# Patient Record
Sex: Female | Born: 1955 | Race: White | Hispanic: No | Smoking: Never smoker
Health system: Southern US, Community
[De-identification: ages and names within clinical notes are randomized; demographics above are authoritative.]

## PROBLEM LIST (undated history)

## (undated) DIAGNOSIS — C801 Malignant (primary) neoplasm, unspecified: Secondary | ICD-10-CM

## (undated) DIAGNOSIS — T7840XA Allergy, unspecified, initial encounter: Secondary | ICD-10-CM

## (undated) DIAGNOSIS — R569 Unspecified convulsions: Secondary | ICD-10-CM

## (undated) DIAGNOSIS — H539 Unspecified visual disturbance: Secondary | ICD-10-CM

## (undated) HISTORY — DX: Unspecified convulsions: R56.9

## (undated) HISTORY — DX: Unspecified visual disturbance: H53.9

## (undated) HISTORY — PX: COLONOSCOPY: SHX174

## (undated) HISTORY — PX: BREAST SURGERY: SHX581

## (undated) HISTORY — DX: Allergy, unspecified, initial encounter: T78.40XA

## (undated) HISTORY — PX: WISDOM TOOTH EXTRACTION: SHX21

## (undated) HISTORY — PX: MASTECTOMY: SHX3

---
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
## 2001-07-31 ENCOUNTER — Other Ambulatory Visit: Admission: RE | Admit: 2001-07-31 | Discharge: 2001-07-31 | Payer: Self-pay | Admitting: *Deleted

## 2001-07-31 ENCOUNTER — Encounter: Admission: RE | Admit: 2001-07-31 | Discharge: 2001-07-31 | Payer: Self-pay | Admitting: *Deleted

## 2001-09-03 ENCOUNTER — Ambulatory Visit (HOSPITAL_COMMUNITY): Admission: RE | Admit: 2001-09-03 | Discharge: 2001-09-03 | Payer: Self-pay | Admitting: Gastroenterology

## 2002-08-14 ENCOUNTER — Other Ambulatory Visit: Admission: RE | Admit: 2002-08-14 | Discharge: 2002-08-14 | Payer: Self-pay | Admitting: *Deleted

## 2003-11-04 ENCOUNTER — Encounter: Admission: RE | Admit: 2003-11-04 | Discharge: 2003-11-04 | Payer: Self-pay | Admitting: Internal Medicine

## 2010-12-24 ENCOUNTER — Encounter: Payer: Self-pay | Admitting: Internal Medicine

## 2017-04-10 ENCOUNTER — Ambulatory Visit (INDEPENDENT_AMBULATORY_CARE_PROVIDER_SITE_OTHER): Payer: TRICARE For Life (TFL) | Admitting: Neurology

## 2017-04-10 ENCOUNTER — Encounter: Payer: Self-pay | Admitting: Neurology

## 2017-04-10 DIAGNOSIS — G40209 Localization-related (focal) (partial) symptomatic epilepsy and epileptic syndromes with complex partial seizures, not intractable, without status epilepticus: Secondary | ICD-10-CM | POA: Diagnosis not present

## 2017-04-10 MED ORDER — GABAPENTIN 100 MG PO CAPS: ORAL_CAPSULE | ORAL | 4 refills | Status: DC

## 2017-04-10 MED ORDER — OXCARBAZEPINE 300 MG PO TABS
ORAL_TABLET | ORAL | 4 refills | Status: DC
Start: 2017-04-10 — End: 2018-05-09

## 2017-04-10 NOTE — Progress Notes (Signed)
[facility]  PATIENT: Sally Nguyen DOB: [birth year]  REFERRING DOCTOR OR PCP:    Dr. Starleen Blue.   PCP is [facility] (was seeing Dr. Karlton Lemon there) SOURCE: patient, notes from Dr. Starleen Blue.  _________________________________   HISTORICAL  CHIEF COMPLAINT:  Chief Complaint  Patient presents with  . Seizures    Sally Nguyen is here to transfer care of sz. disorder from Dr. Starleen Blue to Dr. Felecia Shelling.  Sts. she was dx. with sz. in 2004.  Sts. she is currenlty well controlled on Oxcarbazepine 300mg , 1 and 1/2 tabs in the am, 2 tabs in the pm, and Gabapentin 100mg  qhs. Sts. last sz. was around 2004/fim    HISTORY OF PRESENT ILLNESS:  I had the pleasure seeing you patient, Sally Nguyen, at [facility] for neurologic consultation regarding her seizure disorder.  She is a 61 year old woman who was diagnosed with seizures in 1996.    She was asleep when a grand mal seizure occurred, witnessed by her husband.    She was taken to the ER and had MRI and EEG.    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   The EEG reportedly showed some asymmetry in the frontal lobe but it was non-specific.   She was started on Dilantin but stopped due to insurance issues a couple years later.   She had several more auras over the next couple years. She has two more seizures in 2000 and went back to her Dilantin (though not too compliant).   Over the next few years, she had multiple auras and had her next two seizures were in 2004 (while on vacation).   She started to see Dr. Starleen Blue and she was started on Trileptal.   Since then, she has had no further auras and no further seizures.     She has been compliant with her medications since 2004.  A recent EEG performed at September 2017 showed hypersynchronous activity in the right temporal region that could be  consistent with a seizure disorder.  An EEG in 2016 showed a left temporal focus.    Current dose of oxcarbazepine is 450 mg in the am and 600 mg at night.  Gabapentin 100-200 mg was added several years ago.   The reason may have been for LBP and sleep disturbances.       REVIEW OF SYSTEMS: Constitutional: No fevers, chills, sweats, or change in appetite.   Notes mild insomnia Eyes: No visual changes, double vision, eye pain Ear, nose and throat: No hearing loss, ear pain, nasal congestion, sore throat Cardiovascular: No chest pain, palpitations Respiratory: No shortness of breath at rest or with exertion.   No wheezes GastrointestinaI: No nausea, vomiting, diarrhea, abdominal pain, fecal incontinence Genitourinary: No dysuria, urinary retention or frequency.  No nocturia. Musculoskeletal: She has osteopenia.   No neck pain.   She gets ome LBP and sees chiropractor.   Integumentary: No rash, pruritus, skin lesions Neurological: as above Psychiatric: No depression at this time.  No anxiety Endocrine: No palpitations, diaphoresis, change in appetite, change in weigh or increased thirst Hematologic/Lymphatic: No anemia, purpura, petechiae. Allergic/Immunologic: She gets seasonal allergies  ALLERGIES: Allergies  Allergen Reactions  . Penicillins Rash    HOME MEDICATIONS:  Current Outpatient Prescriptions:  .  Calcium Citrate (CITRACAL PO), Take by mouth., Disp: , Rfl:  .  cholecalciferol (VITAMIN D) 1000  units tablet, Take 2,000 Units by mouth daily., Disp: , Rfl:  .  fexofenadine (ALLEGRA) 180 MG tablet, Take 180 mg by mouth daily., Disp: , Rfl:  .  gabapentin (NEURONTIN) 100 MG capsule, Take 100 mg by mouth at bedtime., Disp: , Rfl:  .  Oxcarbazepine (TRILEPTAL) 300 MG tablet, Take 300 mg by mouth 2 (two) times daily. Take 1 and 1/2 tablet in the morning and 2 tablets in the evening., Disp: , Rfl:  .  Turmeric 500 MG TABS, Take by mouth., Disp: , Rfl:   PAST MEDICAL  HISTORY: Past Medical History:  Diagnosis Date  . Seizures ([place])   . Vision abnormalities     PAST SURGICAL HISTORY: Past Surgical History:  Procedure Laterality Date  . WISDOM TOOTH EXTRACTION      FAMILY HISTORY: Family History  Problem Relation Age of Onset  . Liver cancer Mother   . Atrial fibrillation Father   . Obesity Sister     SOCIAL HISTORY:  Social History   Social History  . Marital status: Married    Spouse name: N/A  . Number of children: N/A  . Years of education: N/A   Occupational History  . Not on file.   Social History Main Topics  . Smoking status: Never Smoker  . Smokeless tobacco: Never Used  . Alcohol use No  . Drug use: No  . Sexual activity: Not on file   Other Topics Concern  . Not on file   Social History Narrative  . No narrative on file     PHYSICAL EXAM  Vitals:   04/10/17 1133  BP: 96/68  Pulse: 76  Resp: 16  Weight: 120 lb 8 oz (54.7 kg)  Height: 5\' 6"  (1.676 m)    Body mass index is 19.45 kg/m.   General: The patient is well-developed and well-nourished and in no acute distress  Eyes:  Funduscopic exam shows normal optic discs and retinal vessels.  Neck: The neck is supple, no carotid bruits are noted.  The neck is nontender.  Cardiovascular: The heart has a regular rate and rhythm with a normal S1 and S2. There were no murmurs, gallops or rubs. Lungs are clear to auscultation.  Skin: Extremities are without significant edema.  Musculoskeletal:  Back is nontender  Neurologic Exam  Mental status: The patient is alert and oriented x 3 at the time of the examination. The patient has apparent normal recent and remote memory, with an apparently normal attention span and concentration ability.   Speech is normal.  Cranial nerves: Extraocular movements are full. Pupils are equal, round, and reactive to light and accomodation.  Visual fields are full.  Facial symmetry is present. There is good facial sensation to  soft touch bilaterally.Facial strength is normal.  Trapezius and sternocleidomastoid strength is normal. No dysarthria is noted.  The tongue is midline, and the patient has symmetric elevation of the soft palate. No obvious hearing deficits are noted.  Motor:  Muscle bulk is normal.   Tone is normal. Strength is  5 / 5 in all 4 extremities.   Sensory: Sensory testing is intact to pinprick, soft touch and vibration sensation in all 4 extremities.  Coordination: Cerebellar testing reveals good finger-nose-finger and heel-to-shin bilaterally.  Gait and station: Station is normal.   Gait is normal. Tandem gait is normal. Romberg is negative.   Reflexes: Deep tendon reflexes are symmetric and normal bilaterally.   Plantar responses are flexor.    DIAGNOSTIC DATA (LABS, IMAGING,  TESTING) - I reviewed patient records, labs, notes, testing and imaging myself where available.      ASSESSMENT AND PLAN  Partial symptomatic epilepsy with complex partial seizures, not intractable, without status epilepticus ([place]) - Plan: 10-Hydroxycarbazepine, CBC with Differential/Platelet, Comprehensive metabolic panel   In summary, Sally Nguyen is a 61 year old woman with complex partial epilepsy. She has had a total of 5 generalized tonic-clonic seizures and multiple aura.    Her last generalized tonic-clonic seizure was in 2004. That was also the last year that she had an aura.   She has been stable on Trileptal 450 mg every morning and 600 mg daily at bedtime and she tolerates that dose well. She is also on low-dose gabapentin which was probably more for sleep or back pain. She will continue on both of these medications and I will renew them.  As she is very stable for many years, we will make her follow-up visit one year. She should call us sooner if she has any new or worsening neurologic symptoms or any seizures.  Thank you for asking me to see Sally Nguyen. Please let me know if I can be of further  assistance with her or other patients in the future.   Richard A. Felecia Shelling, MD, PhD 02/06/5216, 47:15 AM Certified in Neurology, Clinical Neurophysiology, Sleep Medicine, Pain Medicine and Neuroimaging  [facility] [address] [place], [place] [address] [phone]

## 2017-04-11 NOTE — Progress Notes (Signed)
TODAY'S OV NOTE HAS BEEN FAXED TO [facility] FAX# [phone]/fim

## 2017-04-12 LAB — CBC WITH DIFFERENTIAL/PLATELET
Basophils Absolute: 0.1 10*3/uL (ref 0.0–0.2)
Basos: 1 %
EOS (ABSOLUTE): 0.4 10*3/uL (ref 0.0–0.4)
Eos: 5 %
Hematocrit: 41.2 % (ref 34.0–46.6)
Hemoglobin: 13.7 g/dL (ref 11.1–15.9)
Immature Grans (Abs): 0.1 10*3/uL (ref 0.0–0.1)
Immature Granulocytes: 1 %
Lymphocytes Absolute: 1.2 10*3/uL (ref 0.7–3.1)
Lymphs: 15 %
MCH: 28.4 pg (ref 26.6–33.0)
MCHC: 33.3 g/dL (ref 31.5–35.7)
MCV: 85 fL (ref 79–97)
Monocytes Absolute: 0.7 10*3/uL (ref 0.1–0.9)
Monocytes: 9 %
Neutrophils Absolute: 5.4 10*3/uL (ref 1.4–7.0)
Neutrophils: 69 %
Platelets: 479 10*3/uL — ABNORMAL HIGH (ref 150–379)
RBC: 4.83 x10E6/uL (ref 3.77–5.28)
RDW: 13.5 % (ref 12.3–15.4)
WBC: 7.8 10*3/uL (ref 3.4–10.8)

## 2017-04-12 LAB — COMPREHENSIVE METABOLIC PANEL
ALT: 12 IU/L (ref 0–32)
AST: 17 IU/L (ref 0–40)
Albumin/Globulin Ratio: 1.2 (ref 1.2–2.2)
Albumin: 4.4 g/dL (ref 3.5–5.5)
Alkaline Phosphatase: 64 IU/L (ref 39–117)
BUN/Creatinine Ratio: 19 (ref 9–23)
BUN: 18 mg/dL (ref 6–24)
Bilirubin Total: 0.2 mg/dL (ref 0.0–1.2)
CO2: 26 mmol/L (ref 18–29)
Calcium: 10.2 mg/dL (ref 8.7–10.2)
Chloride: 98 mmol/L (ref 96–106)
Creatinine, Ser: 0.93 mg/dL (ref 0.57–1.00)
GFR calc Af Amer: 78 mL/min/{1.73_m2} (ref >59)
GFR calc non Af Amer: 67 mL/min/{1.73_m2} (ref >59)
Globulin, Total: 3.6 g/dL (ref 1.5–4.5)
Glucose: 111 mg/dL — ABNORMAL HIGH (ref 65–99)
Potassium: 4.7 mmol/L (ref 3.5–5.2)
Sodium: 143 mmol/L (ref 134–144)
Total Protein: 8 g/dL (ref 6.0–8.5)

## 2017-04-12 LAB — 10-HYDROXYCARBAZEPINE: Oxcarbazepine SerPl-Mcnc: 10 ug/mL (ref 10–35)

## 2017-04-16 ENCOUNTER — Telehealth: Payer: Self-pay | Admitting: *Deleted

## 2017-04-16 NOTE — Telephone Encounter (Signed)
-----   Message from Britt Bottom, MD sent at 04/13/2017  8:32 AM EDT ----- Please let the patient know that the lab work is fine.

## 2017-04-16 NOTE — Telephone Encounter (Signed)
I have spoken with Sally Nguyen this morning and per RAS, explained labs done in our office are normal/fim

## 2018-04-11 ENCOUNTER — Ambulatory Visit: Payer: TRICARE For Life (TFL) | Admitting: Neurology

## 2018-05-09 ENCOUNTER — Ambulatory Visit (INDEPENDENT_AMBULATORY_CARE_PROVIDER_SITE_OTHER): Admitting: Neurology

## 2018-05-09 ENCOUNTER — Encounter

## 2018-05-09 ENCOUNTER — Encounter: Payer: Self-pay | Admitting: Neurology

## 2018-05-09 ENCOUNTER — Other Ambulatory Visit: Payer: Self-pay

## 2018-05-09 VITALS — BP 114/60 | HR 90 | Resp 16 | Ht 66.0 in | Wt 127.5 lb

## 2018-05-09 DIAGNOSIS — G40209 Localization-related (focal) (partial) symptomatic epilepsy and epileptic syndromes with complex partial seizures, not intractable, without status epilepticus: Secondary | ICD-10-CM | POA: Diagnosis not present

## 2018-05-09 DIAGNOSIS — G4700 Insomnia, unspecified: Secondary | ICD-10-CM | POA: Diagnosis not present

## 2018-05-09 DIAGNOSIS — G4721 Circadian rhythm sleep disorder, delayed sleep phase type: Secondary | ICD-10-CM | POA: Insufficient documentation

## 2018-05-09 MED ORDER — OXCARBAZEPINE 300 MG PO TABS: ORAL_TABLET | ORAL | 4 refills | Status: DC

## 2018-05-09 MED ORDER — GABAPENTIN 100 MG PO CAPS: ORAL_CAPSULE | ORAL | 4 refills | Status: DC

## 2018-05-09 NOTE — Progress Notes (Signed)
[facility]  PATIENT: Sally Nguyen DOB: Apr 04, 1956  REFERRING DOCTOR OR PCP:    Dr. Starleen Blue.   PCP is [facility] (was seeing Dr. Karlton Lemon there) SOURCE: patient, notes from Dr. Starleen Blue.  _________________________________   HISTORICAL  CHIEF COMPLAINT:  Chief Complaint  Patient presents with  . Seizures    Reports continued compliance with Gabapentin and Oxcarbazepine. Last sz. was in 2004./fim    HISTORY OF PRESENT ILLNESS:  Sally Nguyen is a 62 year old woman who was diagnosed with seizures in 1996.      Update 05/09/2018:   She feels she has been stable since her last visit.    She is tolerating her medications well.    Her last seizure was in 2004.  That dose about the time of her last aura.  She had 2 EEGs over the past few years that showed some abnormalities but she had no clinical events associated with them.  Gabapentin was added to the oxcarbazepine by Dr. Starleen Blue after her last EEG.  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  Most nights, she will stay up until 1 or 2 AM and sometimes still has trouble falling asleep.  She wakes up around 10 AM or even later.  She has always been a "night owl".  She does not snore.    From 04/10/2017: She was asleep when a grand mal seizure occurred, witnessed by her husband.    She was taken to the ER and had MRI and EEG.    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   The EEG reportedly showed some asymmetry in the frontal lobe but it was non-specific.   She was started on Dilantin but stopped due to insurance issues a couple years later.   She had several more auras over the next couple years. She has two more seizures in 2000 and went back to her Dilantin (though not too compliant).   Over the next few years, she had multiple auras and had her next two  seizures were in 2004 (while on vacation).   She started to see Dr. Starleen Blue and she was started on Trileptal.   Since then, she has had no further auras and no further seizures.     She has been compliant with her medications since 2004.  A recent EEG performed at September 2017 showed hypersynchronous activity in the right temporal region that could be consistent with a seizure disorder.  An EEG in 2016 showed a left temporal focus.    Current dose of oxcarbazepine is 450 mg in the am and 600 mg at night.  Gabapentin 100-200 mg was added several years ago.   The reason may have been for LBP and sleep disturbances.       REVIEW OF SYSTEMS: Constitutional: No fevers, chills, sweats, or change in appetite.   Notes mild insomnia Eyes: No visual changes, double vision, eye pain Ear, nose and throat: No hearing loss, ear pain, nasal congestion, sore throat Cardiovascular: No chest pain, palpitations Respiratory: No shortness of breath at rest or with exertion.   No wheezes GastrointestinaI: No nausea, vomiting, diarrhea, abdominal pain, fecal incontinence Genitourinary: No dysuria, urinary retention or frequency.  No nocturia. Musculoskeletal: She has osteopenia.   No neck pain.   She gets ome LBP and sees chiropractor.   Integumentary:  No rash, pruritus, skin lesions Neurological: as above Psychiatric: No depression at this time.  No anxiety Endocrine: No palpitations, diaphoresis, change in appetite, change in weigh or increased thirst Hematologic/Lymphatic: No anemia, purpura, petechiae. Allergic/Immunologic: She gets seasonal allergies  ALLERGIES: Allergies  Allergen Reactions  . Penicillins Rash    HOME MEDICATIONS:  Current Outpatient Medications:  .  Calcium Citrate (CITRACAL PO), Take by mouth., Disp: , Rfl:  .  cholecalciferol (VITAMIN D) 1000 units tablet, Take 2,000 Units by mouth daily., Disp: , Rfl:  .  fexofenadine (ALLEGRA) 180 MG tablet, Take 180 mg by mouth daily.,  Disp: , Rfl:  .  gabapentin (NEURONTIN) 100 MG capsule, One or two at bedtime, Disp: 180 capsule, Rfl: 4 .  Oxcarbazepine (TRILEPTAL) 300 MG tablet, Take 1 and 1/2 tablet in the morning and 2 tablets in the evening., Disp: 360 tablet, Rfl: 4 .  Turmeric 500 MG TABS, Take by mouth., Disp: , Rfl:   PAST MEDICAL HISTORY: Past Medical History:  Diagnosis Date  . Seizures ([place])   . Vision abnormalities     PAST SURGICAL HISTORY: Past Surgical History:  Procedure Laterality Date  . WISDOM TOOTH EXTRACTION      FAMILY HISTORY: Family History  Problem Relation Age of Onset  . Liver cancer Mother   . Atrial fibrillation Father   . Obesity Sister     SOCIAL HISTORY:  Social History   Socioeconomic History  . Marital status: Married    Spouse name: Not on file  . Number of children: Not on file  . Years of education: Not on file  . Highest education level: Not on file  Occupational History  . Not on file  Social Needs  . Financial resource strain: Not on file  . Food insecurity:    Worry: Not on file    Inability: Not on file  . Transportation needs:    Medical: Not on file    Non-medical: Not on file  Tobacco Use  . Smoking status: Never Smoker  . Smokeless tobacco: Never Used  Substance and Sexual Activity  . Alcohol use: No  . Drug use: No  . Sexual activity: Not on file  Lifestyle  . Physical activity:    Days per week: Not on file    Minutes per session: Not on file  . Stress: Not on file  Relationships  . Social connections:    Talks on phone: Not on file    Gets together: Not on file    Attends religious service: Not on file    Active member of club or organization: Not on file    Attends meetings of clubs or organizations: Not on file    Relationship status: Not on file  . Intimate partner violence:    Fear of current or ex partner: Not on file    Emotionally abused: Not on file    Physically abused: Not on file    Forced sexual activity: Not on file   Other Topics Concern  . Not on file  Social History Narrative  . Not on file     PHYSICAL EXAM  Vitals:   05/09/18 1555  BP: 114/60  Pulse: 90  Resp: 16  Weight: 127 lb 8 oz (57.8 kg)  Height: 5\' 6"  (1.676 m)    Body mass index is 20.58 kg/m.   General: The patient is well-developed and well-nourished and in no acute distress   Neurologic Exam  Mental status: The patient is alert  and oriented x 3 at the time of the examination. The patient has apparent normal recent and remote memory, with an apparently normal attention span and concentration ability.   Speech is normal.  Cranial nerves: Extraocular muscles are normal.  Facial strength and sensation is normal.  Trapezius strength is normal..   The tongue is midline, and the patient has symmetric elevation of the soft palate. No obvious hearing deficits are noted.  Motor:  Muscle bulk is normal.   Tone is normal. Strength is  5 / 5 in all 4 extremities.   Sensory: Sensory testing is intact to pinprick, soft touch and vibration sensation in all 4 extremities.  Coordination: Cerebellar testing reveals good finger-nose-finger and heel-to-shin bilaterally.  Gait and station: Station is normal.  Gait and tandem gait are normal.  Reflexes: Deep tendon reflexes are symmetric and normal bilaterally.       DIAGNOSTIC DATA (LABS, IMAGING, TESTING) - I reviewed patient records, labs, notes, testing and imaging myself where available.      ASSESSMENT AND PLAN  Partial symptomatic epilepsy with complex partial seizures, not intractable, without status epilepticus ([facility])  Insomnia, unspecified type  Delayed sleep phase syndrome  1.    She will continue oxcarbazepine at the current dose.  I will send in a 1 year refill.  She could consider stopping the gabapentin if she wants because it is unlikely that the low dose contributes much to seizure control. 2.   We had a long discussion about her delayed phase sleep disorder and  its contribution to her insomnia.  She would prefer to shift her schedule to a 10 PM sleep onset.  I discussed with her that if she wished to shift her schedule it will be easier for her to delay her sleep by 2 hours a day until she is going to bed at the desired 10 PM time.  She can reinforce this by taking melatonin 60 minutes before bedtime and using bright lights shortly after she wakes up, at least 8 hours after sleep onset. 3.  she will return to see me in 1 year or sooner if there are new or worsening neurologic symptoms.Nanine Means. Felecia Shelling, MD, PhD 03/11/5461, 7:03 PM Certified in Neurology, Clinical Neurophysiology, Sleep Medicine, Pain Medicine and Neuroimaging  [facility] [address], [place], [place] [address] [phone]

## 2019-02-17 ENCOUNTER — Other Ambulatory Visit: Payer: Self-pay

## 2019-02-17 ENCOUNTER — Ambulatory Visit (INDEPENDENT_AMBULATORY_CARE_PROVIDER_SITE_OTHER): Admitting: Nurse Practitioner

## 2019-02-17 ENCOUNTER — Other Ambulatory Visit (HOSPITAL_COMMUNITY)
Admission: RE | Admit: 2019-02-17 | Discharge: 2019-02-17 | Disposition: A | Discharge status: Home / Self Care | Source: Ambulatory Visit | Attending: Nurse Practitioner | Admitting: Nurse Practitioner

## 2019-02-17 ENCOUNTER — Encounter: Payer: Self-pay | Admitting: Nurse Practitioner

## 2019-02-17 VITALS — BP 114/74 | HR 73 | Temp 97.7°F | Ht 66.0 in | Wt 130.0 lb

## 2019-02-17 DIAGNOSIS — Z0000 Encounter for general adult medical examination without abnormal findings: Secondary | ICD-10-CM | POA: Diagnosis not present

## 2019-02-17 DIAGNOSIS — G40209 Localization-related (focal) (partial) symptomatic epilepsy and epileptic syndromes with complex partial seizures, not intractable, without status epilepticus: Secondary | ICD-10-CM

## 2019-02-17 DIAGNOSIS — Z124 Encounter for screening for malignant neoplasm of cervix: Secondary | ICD-10-CM | POA: Insufficient documentation

## 2019-02-17 DIAGNOSIS — R7309 Other abnormal glucose: Secondary | ICD-10-CM

## 2019-02-17 DIAGNOSIS — Z1231 Encounter for screening mammogram for malignant neoplasm of breast: Secondary | ICD-10-CM

## 2019-02-17 LAB — POCT URINALYSIS DIPSTICK
Bilirubin, UA: NEGATIVE
Blood, UA: NEGATIVE
Glucose, UA: NEGATIVE
Ketones, UA: NEGATIVE
Leukocytes, UA: NEGATIVE
Nitrite, UA: NEGATIVE
Protein, UA: NEGATIVE
Spec Grav, UA: 1.025 (ref 1.010–1.025)
Urobilinogen, UA: 0.2 E.U./dL
pH, UA: 6.5 (ref 5.0–8.0)

## 2019-02-17 NOTE — Progress Notes (Signed)
Subjective:     Patient ID: Sally Nguyen , female    DOB: 06-08-1956 , 63 y.o.   MRN: [id number]   Chief Complaint  Patient presents with  . Annual Exam   The patient states she uses post menopausal status for birth control. Last LMP was No LMP recorded.. Negative for Dysmenorrhea and Negative for Menorrhagia Mammogram last done 2019.   Negative for: breast discharge, breast lump(s), breast pain and breast self exam.  Pertinent negatives include abnormal bleeding (hematology), anxiety, decreased libido, depression, difficulty falling sleep, dyspareunia, history of infertility, nocturia, sexual dysfunction, sleep disturbances, urinary incontinence, urinary urgency, vaginal discharge and vaginal itching. Diet regular.The patient states her exercise level is  minimal   The patient's tobacco use is:   Social History   Tobacco Use  Smoking Status Never Smoker  Smokeless Tobacco Never Used   She has been exposed to passive smoke. The patient's alcohol use is:  Social History   Substance and Sexual Activity  Alcohol Use No   Additional information: Last pap 01/2017, next one scheduled for today.   HPI  HPI   Past Medical History:  Diagnosis Date  . Seizures ([place])   . Vision abnormalities      Family History  Problem Relation Age of Onset  . Liver cancer Mother   . Atrial fibrillation Father   . Obesity Sister      Current Outpatient Medications:  .  Calcium Citrate (CITRACAL PO), Take by mouth., Disp: , Rfl:  .  cholecalciferol (VITAMIN D) 1000 units tablet, Take 2,000 Units by mouth daily., Disp: , Rfl:  .  fexofenadine (ALLEGRA) 180 MG tablet, Take 180 mg by mouth daily., Disp: , Rfl:  .  gabapentin (NEURONTIN) 100 MG capsule, One or two at bedtime, Disp: 180 capsule, Rfl: 4 .  Oxcarbazepine (TRILEPTAL) 300 MG tablet, Take 1 and 1/2 tablet in the morning and 2 tablets in the evening., Disp: 360 tablet, Rfl: 4 .  Turmeric 500 MG TABS, Take by mouth., Disp: , Rfl:     Allergies  Allergen Reactions  . Penicillins Rash     Review of Systems  Constitutional: Negative.   HENT: Negative.   Eyes: Negative.   Respiratory: Negative.   Cardiovascular: Negative.   Gastrointestinal: Negative.   Endocrine: Negative.   Genitourinary: Negative.   Musculoskeletal: Negative.   Skin: Negative.   Allergic/Immunologic: Negative.   Neurological: Negative.  Negative for dizziness and headaches.  Hematological: Negative.   Psychiatric/Behavioral: Negative.      Today's Vitals   02/17/19 1109  BP: 114/74  Pulse: 73  Temp: 97.7 F (36.5 C)  TempSrc: Oral  SpO2: 94%  Weight: 130 lb (59 kg)  Height: 5\' 6"  (1.676 m)   Body mass index is 20.98 kg/m.   Objective:  Physical Exam Vitals signs reviewed.  Constitutional:      Appearance: Normal appearance. She is well-developed.  HENT:     Head: Normocephalic and atraumatic.     Right Ear: Hearing, tympanic membrane, ear canal and external ear normal.     Left Ear: Hearing, tympanic membrane, ear canal and external ear normal.     Nose: Nose normal.     Mouth/Throat:     Mouth: Mucous membranes are moist.  Eyes:     General: Lids are normal.        Right eye: No discharge.        Left eye: No discharge.     Extraocular Movements: Extraocular movements  intact.     Conjunctiva/sclera: Conjunctivae normal.     Pupils: Pupils are equal, round, and reactive to light.     Funduscopic exam:    Right eye: No papilledema.        Left eye: No papilledema.  Neck:     Musculoskeletal: Full passive range of motion without pain, normal range of motion and neck supple.     Thyroid: No thyroid mass.     Vascular: No carotid bruit.  Cardiovascular:     Rate and Rhythm: Normal rate and regular rhythm.     Pulses: Normal pulses.     Heart sounds: Normal heart sounds. No murmur.  Pulmonary:     Effort: Pulmonary effort is normal.     Breath sounds: Normal breath sounds.  Chest:     Chest wall: No mass.      Breasts:        Right: Normal. No mass, nipple discharge or tenderness.        Left: Normal. No mass, nipple discharge or tenderness.  Abdominal:     General: Abdomen is flat. Bowel sounds are normal.     Palpations: Abdomen is soft.  Genitourinary:    Pubic Area: No rash.      Labia:        Right: No rash or tenderness.        Left: No rash or tenderness.   Musculoskeletal: Normal range of motion.        General: No swelling.     Right lower leg: No edema.     Left lower leg: No edema.  Lymphadenopathy:     Upper Body:     Right upper body: No supraclavicular adenopathy.     Left upper body: No supraclavicular adenopathy.  Skin:    General: Skin is warm and dry.     Capillary Refill: Capillary refill takes less than 2 seconds.  Neurological:     General: No focal deficit present.     Mental Status: She is alert and oriented to person, place, and time.     Cranial Nerves: No cranial nerve deficit.     Sensory: No sensory deficit.  Psychiatric:        Mood and Affect: Mood normal.        Behavior: Behavior normal.        Thought Content: Thought content normal.        Judgment: Judgment normal.         Assessment And Plan:     1. Screening mammogram, encounter for  Pt instructed on Self Breast Exam.According to ACOG guidelines Women aged 35 and older are recommended to get an annual mammogram. Form completed and given to patient contact the [facility] for appointment scheduing.   Pt encouraged to get annual mammogram - MM DIGITAL SCREENING BILATERAL; Future  2. Routine physical examination . Behavior modifications discussed and diet history reviewed.   . Pt will continue to exercise regularly and modify diet with low GI, plant based foods and decrease intake of processed foods.  . Recommend intake of daily multivitamin, Vitamin D, and calcium.  . Recommend mammogram for preventive screenings, as well as recommend immunizations that include influenza, TDAP -  POCT Urinalysis Dipstick (81002) - CMP14 + Anion Gap - CBC no Diff  3. Abnormal glucose  Chronic, controlled  No current medications - Hemoglobin A1c  4. Encounter for Papanicolaou smear of cervix  No abnormal findings on physical exam - Cytology -Pap  Smear  5. Partial symptomatic epilepsy with complex partial seizures, not intractable, without status epilepticus ([place]) Chronic, controlled Being followed by [facility] denies any seizure activity Continue with current medications - CMP14 + Anion Gap        [name], FNP

## 2019-02-18 LAB — CMP14 + ANION GAP
ALT: 8 IU/L (ref 0–32)
AST: 14 IU/L (ref 0–40)
Albumin/Globulin Ratio: 1.7 (ref 1.2–2.2)
Albumin: 4.5 g/dL (ref 3.8–4.8)
Alkaline Phosphatase: 61 IU/L (ref 39–117)
Anion Gap: 16 mmol/L (ref 10.0–18.0)
BUN/Creatinine Ratio: 19 (ref 12–28)
BUN: 17 mg/dL (ref 8–27)
Bilirubin Total: <0.2 mg/dL (ref 0.0–1.2)
CO2: 26 mmol/L (ref 20–29)
Calcium: 10.2 mg/dL (ref 8.7–10.3)
Chloride: 102 mmol/L (ref 96–106)
Creatinine, Ser: 0.88 mg/dL (ref 0.57–1.00)
GFR calc Af Amer: 82 mL/min/{1.73_m2} (ref >59)
GFR calc non Af Amer: 71 mL/min/{1.73_m2} (ref >59)
Globulin, Total: 2.7 g/dL (ref 1.5–4.5)
Glucose: 77 mg/dL (ref 65–99)
Potassium: 4.3 mmol/L (ref 3.5–5.2)
Sodium: 144 mmol/L (ref 134–144)
Total Protein: 7.2 g/dL (ref 6.0–8.5)

## 2019-02-18 LAB — HEMOGLOBIN A1C
Est. average glucose Bld gHb Est-mCnc: 123 mg/dL
Hgb A1c MFr Bld: 5.9 % — ABNORMAL HIGH (ref 4.8–5.6)

## 2019-02-18 LAB — CBC
Hematocrit: 40 % (ref 34.0–46.6)
Hemoglobin: 13.4 g/dL (ref 11.1–15.9)
MCH: 29.1 pg (ref 26.6–33.0)
MCHC: 33.5 g/dL (ref 31.5–35.7)
MCV: 87 fL (ref 79–97)
Platelets: 280 10*3/uL (ref 150–450)
RBC: 4.61 x10E6/uL (ref 3.77–5.28)
RDW: 13 % (ref 11.7–15.4)
WBC: 5.7 10*3/uL (ref 3.4–10.8)

## 2019-02-21 LAB — CYTOLOGY - PAP: Diagnosis: NEGATIVE

## 2019-04-29 ENCOUNTER — Other Ambulatory Visit: Payer: Self-pay

## 2019-04-29 ENCOUNTER — Ambulatory Visit
Admission: RE | Admit: 2019-04-29 | Discharge: 2019-04-29 | Disposition: A | Discharge status: Home / Self Care | Source: Ambulatory Visit | Attending: Nurse Practitioner | Admitting: Nurse Practitioner

## 2019-04-29 DIAGNOSIS — Z1231 Encounter for screening mammogram for malignant neoplasm of breast: Secondary | ICD-10-CM

## 2019-04-30 ENCOUNTER — Other Ambulatory Visit: Payer: Self-pay | Admitting: Nurse Practitioner

## 2019-04-30 DIAGNOSIS — R921 Mammographic calcification found on diagnostic imaging of breast: Secondary | ICD-10-CM

## 2019-05-05 DIAGNOSIS — C801 Malignant (primary) neoplasm, unspecified: Secondary | ICD-10-CM

## 2019-05-05 HISTORY — DX: Malignant (primary) neoplasm, unspecified: C80.1

## 2019-05-08 ENCOUNTER — Other Ambulatory Visit: Payer: Self-pay

## 2019-05-08 ENCOUNTER — Other Ambulatory Visit: Payer: Self-pay | Admitting: Nurse Practitioner

## 2019-05-08 ENCOUNTER — Ambulatory Visit
Admission: RE | Admit: 2019-05-08 | Discharge: 2019-05-08 | Disposition: A | Discharge status: Home / Self Care | Source: Ambulatory Visit | Attending: Nurse Practitioner | Admitting: Nurse Practitioner

## 2019-05-08 DIAGNOSIS — R921 Mammographic calcification found on diagnostic imaging of breast: Secondary | ICD-10-CM

## 2019-05-12 ENCOUNTER — Telehealth: Payer: Self-pay | Admitting: *Deleted

## 2019-05-12 ENCOUNTER — Other Ambulatory Visit: Payer: Self-pay | Admitting: Neurology

## 2019-05-12 NOTE — Telephone Encounter (Signed)
Called pt. Updated med list, pharmacy, allergies on file for VV 05/14/19 with Dr. Felecia Shelling.  Confirmed she received email for VV.

## 2019-05-14 ENCOUNTER — Ambulatory Visit
Admission: RE | Admit: 2019-05-14 | Discharge: 2019-05-14 | Disposition: A | Discharge status: Home / Self Care | Source: Ambulatory Visit | Attending: Nurse Practitioner | Admitting: Nurse Practitioner

## 2019-05-14 ENCOUNTER — Encounter: Payer: Self-pay | Admitting: Neurology

## 2019-05-14 ENCOUNTER — Other Ambulatory Visit: Payer: Self-pay

## 2019-05-14 ENCOUNTER — Ambulatory Visit (INDEPENDENT_AMBULATORY_CARE_PROVIDER_SITE_OTHER): Admitting: Neurology

## 2019-05-14 DIAGNOSIS — G40209 Localization-related (focal) (partial) symptomatic epilepsy and epileptic syndromes with complex partial seizures, not intractable, without status epilepticus: Secondary | ICD-10-CM | POA: Diagnosis not present

## 2019-05-14 DIAGNOSIS — G4721 Circadian rhythm sleep disorder, delayed sleep phase type: Secondary | ICD-10-CM

## 2019-05-14 DIAGNOSIS — G4700 Insomnia, unspecified: Secondary | ICD-10-CM | POA: Diagnosis not present

## 2019-05-14 DIAGNOSIS — R921 Mammographic calcification found on diagnostic imaging of breast: Secondary | ICD-10-CM

## 2019-05-14 MED ORDER — GABAPENTIN 100 MG PO CAPS: ORAL_CAPSULE | ORAL | 4 refills | Status: DC

## 2019-05-14 MED ORDER — OXCARBAZEPINE 300 MG PO TABS: ORAL_TABLET | ORAL | 4 refills | Status: DC

## 2019-05-14 NOTE — Progress Notes (Signed)
[facility]  PATIENT: Sally Nguyen DOB: 1956/03/09  REFERRING DOCTOR OR PCP:    Dr. Starleen Blue.   PCP is [facility] (was seeing Dr. Karlton Lemon there) SOURCE: patient, notes from Dr. Starleen Blue.  _________________________________   HISTORICAL  CHIEF COMPLAINT:  No chief complaint on file.   HISTORY OF PRESENT ILLNESS:  Sally Nguyen is a 63 year old woman who was diagnosed with seizures in 1996.      Update 05/14/2019: Virtual Visit via Video Note I connected with Sally Nguyen on 05/14/19 at  3:00 PM EDT by a video enabled telemedicine application and verified that I am speaking with the correct person.  I discussed the limitations of evaluation and management by telemedicine and the availability of in person appointments. The patient expressed understanding and agreed to proceed.  History of Present Illness: She denies any more seizures.  Her last seizure was in 2004.  Prior to that, she was having about 1-2 seizures per year.  She tolerates the oxcarbazepine well. Current dose is 450 mg qAM and 600 mg qHS. In 2018, the blood level was low normal.  Her last EEG was in 2017 and showed hypersynchronous activity in the right temporal region that could be consistent with a seizure disorder.  An EEG in 2016 showed a left temporal focus.  She reports that most EEGs have shown some abnormality.  Her second issue is a mild sleep disturbance.  She has delayed phase sleep syndrome usually going to bed around 2 AM hours of sleep.  When she falls asleep she does well.  Adding the gabapentin before bedtime has helped her fall asleep and stay asleep better..   Observations/Objective:  She is a well-developed well-nourished woman in no acute distress.  The head is normocephalic and atraumatic.  Sclera are anicteric.  Visible skin appears normal.  The neck has a good range of motion.  She is alert and fully oriented with fluent speech and good attention, knowledge and  memory.  Extraocular muscles are intact.  Facial strength is normal.  She appears to have normal strength in the arms.  Rapid alternating movements and finger-nose-finger are performed well.  Assessment and Plan: Partial symptomatic epilepsy with complex partial seizures, not intractable, without status epilepticus ([place])  Delayed sleep phase syndrome  Insomnia, unspecified type  1.  She will continue oxcarbazepine for her seizures.   At the next visit we will check an oxcarbazepine level and also set up a follow-up EEG to determine if there has been any changes. 2.   Continue gabapentin to help sleep issues.  As she is not working the next day, the delayed phase is not a problem for her.  She has always been a "night owl. 3.    She will return in 1 year or sooner if there are new or worsening neurologic symptoms.  Follow Up Instructions: I discussed the assessment and treatment plan with the patient. The patient was provided an opportunity to ask questions and all were answered. The patient agreed with the plan and demonstrated an understanding of the instructions.    The patient was advised to call back or seek an in-person evaluation if the symptoms worsen or if the condition fails to improve as anticipated.  I provided 18 minutes of non-face-to-face time during this encounter.  _____________________________________________  Update 05/09/2018:   She feels she has been stable since her last visit.    She is tolerating her medications well.    Her  last seizure was in 2004.  That dose about the time of her last aura.  She had 2 EEGs over the past few years that showed some abnormalities but she had no clinical events associated with them.  Gabapentin was added to the oxcarbazepine by Dr. Starleen Blue after her last EEG.  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  Most nights, she will stay up until 1 or 2 AM and sometimes still has trouble falling asleep.  She wakes up  around 10 AM or even later.  She has always been a "night owl".  She does not snore.    From 04/10/2017: She was asleep when a grand mal seizure occurred, witnessed by her husband.    She was taken to the ER and had MRI and EEG.    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   The EEG reportedly showed some asymmetry in the frontal lobe but it was non-specific.   She was started on Dilantin but stopped due to insurance issues a couple years later.   She had several more auras over the next couple years. She has two more seizures in 2000 and went back to her Dilantin (though not too compliant).   Over the next few years, she had multiple auras and had her next two seizures were in 2004 (while on vacation).   She started to see Dr. Starleen Blue and she was started on Trileptal.   Since then, she has had no further auras and no further seizures.     She has been compliant with her medications since 2004.  A recent EEG performed at September 2017 showed hypersynchronous activity in the right temporal region that could be consistent with a seizure disorder.  An EEG in 2016 showed a left temporal focus.    Current dose of oxcarbazepine is 450 mg in the am and 600 mg at night.  Gabapentin 100-200 mg was added several years ago.   The reason may have been for LBP and sleep disturbances.       REVIEW OF SYSTEMS: Constitutional: No fevers, chills, sweats, or change in appetite.   Notes mild insomnia Eyes: No visual changes, double vision, eye pain Ear, nose and throat: No hearing loss, ear pain, nasal congestion, sore throat Cardiovascular: No chest pain, palpitations Respiratory: No shortness of breath at rest or with exertion.   No wheezes GastrointestinaI: No nausea, vomiting, diarrhea, abdominal pain, fecal incontinence Genitourinary: No dysuria, urinary retention or frequency.  No nocturia.  Musculoskeletal: She has osteopenia.   No neck pain.   She gets ome LBP and sees chiropractor.   Integumentary: No rash, pruritus, skin lesions Neurological: as above Psychiatric: No depression at this time.  No anxiety Endocrine: No palpitations, diaphoresis, change in appetite, change in weigh or increased thirst Hematologic/Lymphatic: No anemia, purpura, petechiae. Allergic/Immunologic: She gets seasonal allergies  ALLERGIES: Allergies  Allergen Reactions  . Penicillins Rash    HOME MEDICATIONS:  Current Outpatient Medications:  .  Calcium Citrate (CITRACAL PO), Take by mouth. +vit d, Disp: , Rfl:  .  cholecalciferol (VITAMIN D) 1000 units tablet, Take 2,000 Units by mouth daily., Disp: , Rfl:  .  fexofenadine (ALLEGRA) 180 MG tablet, Take 180 mg by mouth daily., Disp: , Rfl:  .  gabapentin (NEURONTIN) 100 MG capsule, TAKE 1 OR 2 CAPSULES AT BEDTIME, Disp: 180  capsule, Rfl: 3 .  Oxcarbazepine (TRILEPTAL) 300 MG tablet, Take 1 and 1/2 tablet in the morning and 2 tablets in the evening., Disp: 360 tablet, Rfl: 4 .  Turmeric 500 MG TABS, Take 1 tablet by mouth 2 (two) times a day. , Disp: , Rfl:   PAST MEDICAL HISTORY: Past Medical History:  Diagnosis Date  . Seizures ([place])   . Vision abnormalities     PAST SURGICAL HISTORY: Past Surgical History:  Procedure Laterality Date  . WISDOM TOOTH EXTRACTION      FAMILY HISTORY: Family History  Problem Relation Age of Onset  . Liver cancer Mother   . Atrial fibrillation Father   . Obesity Sister     SOCIAL HISTORY:  Social History   Socioeconomic History  . Marital status: Married    Spouse name: Not on file  . Number of children: Not on file  . Years of education: Not on file  . Highest education level: Not on file  Occupational History  . Not on file  Social Needs  . Financial resource strain: Not on file  . Food insecurity:    Worry: Not on file    Inability: Not on file  . Transportation needs:     Medical: Not on file    Non-medical: Not on file  Tobacco Use  . Smoking status: Never Smoker  . Smokeless tobacco: Never Used  Substance and Sexual Activity  . Alcohol use: No  . Drug use: No  . Sexual activity: Not on file  Lifestyle  . Physical activity:    Days per week: Not on file    Minutes per session: Not on file  . Stress: Not on file  Relationships  . Social connections:    Talks on phone: Not on file    Gets together: Not on file    Attends religious service: Not on file    Active member of club or organization: Not on file    Attends meetings of clubs or organizations: Not on file    Relationship status: Not on file  . Intimate partner violence:    Fear of current or ex partner: Not on file    Emotionally abused: Not on file    Physically abused: Not on file    Forced sexual activity: Not on file  Other Topics Concern  . Not on file  Social History Narrative  . Not on file     PHYSICAL EXAM  There were no vitals filed for this visit.  There is no height or weight on file to calculate BMI.   General: The patient is well-developed and well-nourished and in no acute distress   Neurologic Exam  Mental status: The patient is alert and oriented x 3 at the time of the examination. The patient has apparent normal recent and remote memory, with an apparently normal attention span and concentration ability.   Speech is normal.  Cranial nerves: Extraocular muscles are normal.  Facial strength and sensation is normal.  Trapezius strength is normal..   The tongue is midline, and the patient has symmetric elevation of the soft palate. No obvious hearing deficits are noted.  Motor:  Muscle bulk is normal.   Tone is normal. Strength is  5 / 5 in all 4 extremities.   Sensory: Sensory testing is intact to pinprick, soft touch and vibration sensation in all 4 extremities.  Coordination: Cerebellar testing reveals good finger-nose-finger and heel-to-Nguyen bilaterally.   Gait and station: Station is normal.  Gait and tandem gait are normal.  Reflexes: Deep tendon reflexes are symmetric and normal bilaterally.         Sally A. Felecia Shelling, MD, PhD 4/72/0721, 8:28 PM Certified in Neurology, Clinical Neurophysiology, Sleep Medicine, Pain Medicine and Neuroimaging  [facility] [address], [place], [place] [address] [phone]

## 2019-05-15 ENCOUNTER — Telehealth: Payer: Self-pay | Admitting: Hematology

## 2019-05-15 ENCOUNTER — Encounter: Payer: Self-pay | Admitting: *Deleted

## 2019-05-15 DIAGNOSIS — D0512 Intraductal carcinoma in situ of left breast: Secondary | ICD-10-CM | POA: Insufficient documentation

## 2019-05-15 NOTE — Telephone Encounter (Signed)
Spoke to patient to confirm afternoon [facility] appointment for 6/17, packet emailed and mailed to patient

## 2019-05-19 NOTE — Progress Notes (Signed)
[place]   [phone]) [phone] Fax:([phone]) [place] Note   Patient Care Team: [name], FNP as PCP - General (General Practice) Mauro Kaufmann, RN as Oncology Nurse Navigator Rockwell Germany, RN as Oncology Nurse Navigator Stark Klein, MD as Consulting Physician (General Surgery) Truitt Merle, MD as Consulting Physician (Hematology) Eppie Gibson, MD as Attending Physician (Radiation Oncology)  Date of Service:  05/21/2019   CHIEF COMPLAINTS/PURPOSE OF CONSULTATION:  Newly diagnosed Ductal carcinoma in situ (DCIS) of left breast    Oncology History  Ductal carcinoma in situ (DCIS) of left breast  05/08/2019 Mammogram   Mammogram 05/08/19  IMPRESSION: Suspicious microcalcifications over the outer midportion of the left breast spanning 2.4 x 3.3 x 3.9 cm.   05/14/2019 Initial Biopsy   Diagnosis 05/14/19  Breast, left, needle core biopsy, outer mid - DUCTAL CARCINOMA IN SITU WITH CALCIFICATIONS. - SEE COMMENT. Estrogen Receptor: 0%, NEGATIVE Progesterone Receptor: 0%, NEGATIVE   05/14/2019 Cancer Staging   Staging form: Breast, AJCC 8th Edition - Clinical stage from 05/14/2019: Stage Unknown (cTis (DCIS), cNX, cM0, ER-, PR-, HER2: Not Assessed) - Signed by Truitt Merle, MD on 05/20/2019   05/15/2019 Initial Diagnosis   Ductal carcinoma in situ (DCIS) of left breast      HISTORY OF PRESENTING ILLNESS:  Sally Nguyen 63 y.o. female is a here because of newly diagnosed left breast DCIS. The patient presents to the breast clinic today alone.  This was discovered by Screening mammogram. She did not feel the mass herself. She dose feel a lump from biopsy site. She gets mammograms every year until 2016/01/19. This is her first abnormal mammogram and biopsy. She was previously getting screened in [facility] [place]. She denies any pain, change in breast or weight. She sees her PCP as needed or for yearly exam.   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 She is married with 1 adult son. They have a PMHx of epilepsy since around 01/18/95. She is on Trileptal and Gabapentin. She notes going through fertility work up in the past and tried for 8-9 years. Her mother was diagnosed with colon cancer in 69 and dies in 01-18-2001. She denies any other family history of cancer.     GYN HISTORY  Menarchal: 17 LMP: Fall 2013 Contraceptive: yes, Oral pill for 1 year around 1983-01-18 HRT: No G1P1: first at age 23, breasted for 2 years.     REVIEW OF SYSTEMS:    Constitutional: Denies fevers, chills or abnormal night sweats Eyes: Denies blurriness of vision, double vision or watery eyes Ears, nose, mouth, throat, and face: Denies mucositis or sore throat Respiratory: Denies cough, dyspnea or wheezes Cardiovascular: Denies palpitation, chest discomfort or lower extremity swelling Gastrointestinal:  Denies nausea, heartburn or change in bowel habits Skin: Denies abnormal skin rashes Lymphatics: Denies new lymphadenopathy or easy bruising Neurological:Denies numbness, tingling or new weaknesses Behavioral/Psych: Mood is stable, no new changes  Breast: Left breast ump from biopsy  All other systems were reviewed with the patient and are negative.   MEDICAL HISTORY:  Past Medical History:  Diagnosis Date   Seizures ([place])    Vision abnormalities     SURGICAL HISTORY: Past Surgical History:  Procedure Laterality Date   WISDOM TOOTH EXTRACTION      SOCIAL HISTORY: Social History   Socioeconomic History   Marital status: Married    Spouse name: Not on file   Number of children: 1  Years of education: Not on file   Highest education level: Not on file  Occupational History   Not on file  Social Needs   Financial resource strain: Not on file   Food insecurity    Worry: Not on file    Inability: Not on file   Transportation needs    Medical: Not on file    Non-medical: Not on file  Tobacco Use   Smoking status: Never  Smoker   Smokeless tobacco: Never Used  Substance and Sexual Activity   Alcohol use: No   Drug use: No   Sexual activity: Not on file  Lifestyle   Physical activity    Days per week: Not on file    Minutes per session: Not on file   Stress: Not on file  Relationships   Social connections    Talks on phone: Not on file    Gets together: Not on file    Attends religious service: Not on file    Active member of club or organization: Not on file    Attends meetings of clubs or organizations: Not on file    Relationship status: Not on file   Intimate partner violence    Fear of current or ex partner: Not on file    Emotionally abused: Not on file    Physically abused: Not on file    Forced sexual activity: Not on file  Other Topics Concern   Not on file  Social History Narrative   Not on file    FAMILY HISTORY: Family History  Problem Relation Age of Onset   Liver cancer Mother    Colon cancer Mother        33   Atrial fibrillation Father    Obesity Sister     ALLERGIES:  is allergic to penicillins.  MEDICATIONS:  Current Outpatient Medications  Medication Sig Dispense Refill   Calcium Citrate (CITRACAL PO) Take by mouth. +vit d     cetirizine (ZYRTEC) 10 MG tablet Take 10 mg by mouth daily.     cholecalciferol (VITAMIN D) 1000 units tablet Take 2,000 Units by mouth daily.     gabapentin (NEURONTIN) 100 MG capsule TAKE 1 OR 2 CAPSULES AT BEDTIME 180 capsule 4   Oxcarbazepine (TRILEPTAL) 300 MG tablet Take 1 and 1/2 tablet in the morning and 2 tablets in the evening. 360 tablet 4   UNABLE TO FIND 500 mg. Med Name: Tumeric Curcumin     UNABLE TO FIND Med Name: LivCo     No current facility-administered medications for this visit.     PHYSICAL EXAMINATION: ECOG PERFORMANCE STATUS: 0 - Asymptomatic  Vitals:   05/21/19 1238  BP: 101/62  Pulse: 83  Resp: 17  Temp: 98.5 F (36.9 C)  SpO2: 99%   Filed Weights   05/21/19 1238  Weight: 127  lb 8 oz (57.8 kg)    GENERAL:alert, no distress and comfortable SKIN: skin color, texture, turgor are normal, no rashes or significant lesions EYES: normal, Conjunctiva are pink and non-injected, sclera clear  NECK: supple, thyroid normal size, non-tender, without nodularity LYMPH:  no palpable lymphadenopathy in the cervical, axillary  LUNGS: clear to auscultation and percussion with normal breathing effort HEART: regular rate & rhythm and no murmurs and no lower extremity edema ABDOMEN:abdomen soft, non-tender and normal bowel sounds Musculoskeletal:no cyanosis of digits and no clubbing  NEURO: alert & oriented x 3 with fluent speech, no focal motor/sensory deficits BREAST: (+) mild skin ecchymosis with 2-2.5cm  lump at 4:00 positron likely from biopsy (+) No other palpable mass or adenopathy of either breasts   LABORATORY DATA:  I have reviewed the data as listed CBC Latest Ref Rng & Units 05/21/2019 02/17/2019 04/10/2017  WBC 4.0 - 10.5 K/uL 6.0 5.7 7.8  Hemoglobin 12.0 - 15.0 g/dL 12.4 13.4 13.7  Hematocrit 36.0 - 46.0 % 39.1 40.0 41.2  Platelets 150 - 400 K/uL 258 280 479(H)    CMP Latest Ref Rng & Units 05/21/2019 02/17/2019 04/10/2017  Glucose 70 - 99 mg/dL 88 77 111(H)  BUN 8 - 23 mg/dL _0 Creatinine 0.44 - 1.00 mg/dL 0.88 0.88 0.93  Sodium 135 - 145 mmol/L 141 144 143  Potassium 3.5 - 5.1 mmol/L 3.9 4.3 4.7  Chloride 98 - 111 mmol/L 106 102 98  CO2 22 - 32 mmol/L _1 Calcium 8.9 - 10.3 mg/dL 9.1 10.2 10.2  Total Protein 6.5 - 8.1 g/dL 7.4 7.2 8.0  Total Bilirubin 0.3 - 1.2 mg/dL 0.2(L) <0.2 0.2  Alkaline Phos 38 - 126 U/L 57 61 64  AST 15 - 41 U/L 12(L) 14 17  ALT 0 - 44 U/L _2 RADIOGRAPHIC STUDIES: I have personally reviewed the radiological images as listed and agreed with the findings in the report. Mm Digital Diagnostic Unilat L  Result Date: 05/08/2019 CLINICAL DATA:  Screening recall for left breast microcalcifications. EXAM: DIGITAL DIAGNOSTIC  left MAMMOGRAM COMPARISON:  Previous exam(s). ACR Breast Density Category c: The breast tissue is heterogeneously dense, which may obscure small masses. FINDINGS: Spot magnification and true lateral images demonstrate a region of pleomorphic microcalcifications over the outer midportion of the left breast in the middle third spanning 2.4 x 3.3 x 3.9 cm. These are all of the same morphology. Remainder of the exam is unchanged. IMPRESSION: Suspicious microcalcifications over the outer midportion of the left breast spanning 2.4 x 3.3 x 3.9 cm. RECOMMENDATION: Recommend stereotactic core needle biopsy for further evaluation. I have discussed the findings and recommendations with the patient. Results were also provided in writing at the conclusion of the visit. If applicable, a reminder letter will be sent to the patient regarding the next appointment. BI-RADS CATEGORY  5: Highly suggestive of malignancy. Biopsy will be scheduled here at the [facility] prior to patient's departure. Electronically Signed   By: Marin Olp M.D.   On: 05/08/2019 14:51   Mm Digital Screening Bilateral  Result Date: 04/29/2019 CLINICAL DATA:  Screening. EXAM: DIGITAL SCREENING BILATERAL MAMMOGRAM WITH CAD COMPARISON:  Previous exam(s). ACR Breast Density Category c: The breast tissue is heterogeneously dense, which may obscure small masses. FINDINGS: In the left breast, calcifications warrant further evaluation. In the right breast, no findings suspicious for malignancy. Images were processed with CAD. IMPRESSION: Further evaluation is suggested for calcifications in the left breast. RECOMMENDATION: Diagnostic mammogram of the left breast. (Code:[id number]) The patient will be contacted regarding the findings, and additional imaging will be scheduled. BI-RADS CATEGORY  0: Incomplete. Need additional imaging evaluation and/or prior mammograms for comparison. Electronically Signed   By: Abelardo Diesel M.D.   On: 04/29/2019  13:31   Mm Clip Placement Left  Result Date: 05/14/2019 CLINICAL DATA:  Status post stereotactic guided core needle biopsy of the recently demonstrated 3.9 cm group of calcifications in the outer left breast. EXAM: DIAGNOSTIC LEFT MAMMOGRAM POST STEREOTACTIC BIOPSY COMPARISON:  Previous exam(s). FINDINGS: Mammographic images were obtained following stereotactic guided biopsy of  the recently demonstrated 3.9 cm group of calcifications in the outer left breast. These demonstrate a coil shaped biopsy marker clip at the location of the biopsied calcifications. IMPRESSION: Appropriate clip deployment following left breast stereotactic guided core needle biopsy. Final Assessment: Post Procedure Mammograms for Marker Placement Electronically Signed   By: Claudie Revering M.D.   On: 05/14/2019 12:20   Mm Lt Breast Bx W Loc Dev 1st Lesion Image Bx Spec Stereo Guide  Addendum Date: 05/15/2019   ADDENDUM REPORT: 05/15/2019 12:59 ADDENDUM: Pathology revealed HIGH GRADE DUCTAL CARCINOMA IN SITU WITH CALCIFICATIONS of the Left breast, outer mid. This was found to be concordant by Dr. Claudie Revering. Pathology results were discussed with the patient by telephone. The patient reported doing well after the biopsy with tenderness at the site. Post biopsy instructions and care were reviewed and questions were answered. The patient was encouraged to call [facility] for any additional concerns. The patient was referred to [facility] at [facility] on May 21, 2019. Pathology results reported by Terie Purser, RN on 05/15/2019. Electronically Signed   By: Claudie Revering M.D.   On: 05/15/2019 12:59   Result Date: 05/15/2019 CLINICAL DATA:  3.9 cm group of highly suspicious calcifications in the outer mid left breast at recent mammography. EXAM: LEFT BREAST STEREOTACTIC CORE NEEDLE BIOPSY COMPARISON:  Previous exams. FINDINGS: The patient and I  discussed the procedure of stereotactic-guided biopsy including benefits and alternatives. We discussed the high likelihood of a successful procedure. We discussed the risks of the procedure including infection, bleeding, tissue injury, clip migration, and inadequate sampling. Informed written consent was given. The usual time out protocol was performed immediately prior to the procedure. Using sterile technique and 1% Lidocaine as local anesthetic, under stereotactic guidance, a 9 gauge vacuum assisted device was used to perform core needle biopsy of the recently demonstrated 3.9 cm group of calcifications in the outer left breast using a lateral approach. Specimen radiograph was performed showing a large number of calcifications in all of the specimens. Specimens with calcifications are identified for pathology. At the conclusion of the procedure, a coil shaped tissue marker clip was deployed into the biopsy cavity. Follow-up 2-view mammogram was performed and dictated separately. IMPRESSION: Stereotactic-guided biopsy of the recently demonstrated 3.9 cm group of calcifications in the outer left breast. No apparent complications. Electronically Signed: By: Claudie Revering M.D. On: 05/14/2019 12:08    ASSESSMENT & PLAN:  DAMYAH GUGEL is a 63 y.o. Caucasian female with a history of seizures   1. Ductal carcinoma in situ (DCIS) of left breast, Stage 0, c(Tis,NX,M0), ER/PR-, High Grade -I discussed her breast imaging and needle biopsy results with patient and her family members in great detail. -She is a candidate for breast conservation surgery. She has been seen by breast surgeon Dr. Barry Dienes, and will likely proceed with lumpectomy soon. -She has density C breast tissue. Dr. Barry Dienes recommends she have breast MRI to further evaluate.  -Her mother had colon cancer but she does not have strong family history of cancer.We discussed genetic testing is an option although it may not be covered by her  insurance -Her DCIS will be cured by complete surgical resection. Any form of adjuvant therapy is preventive. -Given her negative ER and PR, I do not recommend antiestrogen therapy. I discussed her ER/PR negative disease is more aggressive and increases her risk of breast cancer in the future.  -She  will likely benefit from breast radiation if she undergo lumpectomy to decrease the risk of breast cancer. If she undergoes mastectomy, she will not need radiation. She will discuss this further with Dr. Isidore Moos.  -We also discussed that biopsy may have sampling limitation, we will review her surgical path, to see if she has any invasive carcinoma components. -We discussed breast cancer surveillance after she completes treatment, Including annual mammogram, breast exam every 6-12 months. I discussed given high risk of dense breast she can consider breast MRI every 1-2 years. She would like to f/u with [place] yearly in future  -I encouraged her to continue her healthy lifestyle by remaining active and eating balanced meals and continue to monitor her health with PCP.  -Labs reviewed today, CBC and CMP WNL. Physical exam overall benign today.  -F/u after surgery or radiation then yearly.    2. Seizures/Epilepsy -She is on Trileptal and Gabapentin  -She will continue to follow up with neurologist.    Plan -Breast MRI before surgery -She will likely have lumpectomy soon -Will f/u after surgery or towards end of radiation.      No orders of the defined types were placed in this encounter.   All questions were answered. The patient knows to call the clinic with any problems, questions or concerns. I spent 30 minutes counseling the patient face to face. The total time spent in the appointment was 40 minutes and more than 50% was on counseling.     Truitt Merle, MD 05/21/2019 5:39 PM  I, Joslyn Devon, am acting as scribe for Truitt Merle, MD.   I have reviewed the above documentation for accuracy and  completeness, and I agree with the above.

## 2019-05-21 ENCOUNTER — Inpatient Hospital Stay: Attending: Hematology | Admitting: Hematology

## 2019-05-21 ENCOUNTER — Inpatient Hospital Stay

## 2019-05-21 ENCOUNTER — Encounter: Payer: Self-pay | Admitting: Hematology

## 2019-05-21 ENCOUNTER — Encounter: Payer: Self-pay | Admitting: Radiation Oncology

## 2019-05-21 ENCOUNTER — Ambulatory Visit
Admission: RE | Admit: 2019-05-21 | Discharge: 2019-05-21 | Disposition: A | Discharge status: Home / Self Care | Source: Ambulatory Visit | Attending: Radiation Oncology | Admitting: Radiation Oncology

## 2019-05-21 ENCOUNTER — Other Ambulatory Visit: Payer: Self-pay

## 2019-05-21 ENCOUNTER — Other Ambulatory Visit: Payer: Self-pay | Admitting: *Deleted

## 2019-05-21 VITALS — BP 101/62 | HR 83 | Temp 98.5°F | Resp 17 | Ht 66.0 in | Wt 127.5 lb

## 2019-05-21 DIAGNOSIS — Z171 Estrogen receptor negative status [ER-]: Secondary | ICD-10-CM | POA: Diagnosis not present

## 2019-05-21 DIAGNOSIS — D0512 Intraductal carcinoma in situ of left breast: Secondary | ICD-10-CM

## 2019-05-21 DIAGNOSIS — Z79899 Other long term (current) drug therapy: Secondary | ICD-10-CM

## 2019-05-21 DIAGNOSIS — G4089 Other seizures: Secondary | ICD-10-CM

## 2019-05-21 LAB — CBC WITH DIFFERENTIAL (CANCER CENTER ONLY)
Abs Immature Granulocytes: 0.01 10*3/uL (ref 0.00–0.07)
Basophils Absolute: 0.1 10*3/uL (ref 0.0–0.1)
Basophils Relative: 1 %
Eosinophils Absolute: 0.1 10*3/uL (ref 0.0–0.5)
Eosinophils Relative: 2 %
HCT: 39.1 % (ref 36.0–46.0)
Hemoglobin: 12.4 g/dL (ref 12.0–15.0)
Immature Granulocytes: 0 %
Lymphocytes Relative: 19 %
Lymphs Abs: 1.2 10*3/uL (ref 0.7–4.0)
MCH: 28.6 pg (ref 26.0–34.0)
MCHC: 31.7 g/dL (ref 30.0–36.0)
MCV: 90.3 fL (ref 80.0–100.0)
Monocytes Absolute: 0.7 10*3/uL (ref 0.1–1.0)
Monocytes Relative: 11 %
Neutro Abs: 4 10*3/uL (ref 1.7–7.7)
Neutrophils Relative %: 67 %
Platelet Count: 258 10*3/uL (ref 150–400)
RBC: 4.33 MIL/uL (ref 3.87–5.11)
RDW: 13.3 % (ref 11.5–15.5)
WBC Count: 6 10*3/uL (ref 4.0–10.5)
nRBC: 0 % (ref 0.0–0.2)

## 2019-05-21 LAB — CMP (CANCER CENTER ONLY)
ALT: 9 U/L (ref 0–44)
AST: 12 U/L — ABNORMAL LOW (ref 15–41)
Albumin: 3.9 g/dL (ref 3.5–5.0)
Alkaline Phosphatase: 57 U/L (ref 38–126)
Anion gap: 9 (ref 5–15)
BUN: 13 mg/dL (ref 8–23)
CO2: 26 mmol/L (ref 22–32)
Calcium: 9.1 mg/dL (ref 8.9–10.3)
Chloride: 106 mmol/L (ref 98–111)
Creatinine: 0.88 mg/dL (ref 0.44–1.00)
GFR, Est AFR Am: >60 mL/min (ref >60)
GFR, Estimated: >60 mL/min (ref >60)
Glucose, Bld: 88 mg/dL (ref 70–99)
Potassium: 3.9 mmol/L (ref 3.5–5.1)
Sodium: 141 mmol/L (ref 135–145)
Total Bilirubin: 0.2 mg/dL — ABNORMAL LOW (ref 0.3–1.2)
Total Protein: 7.4 g/dL (ref 6.5–8.1)

## 2019-05-21 NOTE — Progress Notes (Signed)
Radiation Oncology         [phone] ________________________________  Initial outpatient Consultation  Name: Sally Nguyen MRN: [id number]  Date: 05/21/2019  DOB: Nov 19, 1956  CC:[name], FNP  Stark Klein, MD   REFERRING PHYSICIAN: Stark Klein, MD  DIAGNOSIS:    ICD-10-CM   1. Ductal carcinoma in situ (DCIS) of left breast  D05.12    Cancer Staging Ductal carcinoma in situ (DCIS) of left breast Staging form: Breast, AJCC 8th Edition - Clinical stage from 05/14/2019: Stage Unknown (cTis (DCIS), cNX, cM0, ER-, PR-, HER2: Not Assessed) - Signed by Truitt Merle, MD on 05/20/2019    CHIEF COMPLAINT: Here to discuss management of left breast DCIS   HISTORY OF PRESENT ILLNESS::Sally Nguyen is a 63 y.o. female who presented with breast abnormality on the following imaging: screening mammogram showed suspicious left breast calcifications spanning 3.9cm.    Biopsy of left breast showed high grade DCIS with calcifications.  ER status: neg; PR status neg.  MRI recommended at tumor board this AM, likely breast conserving surgery.  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  She is unsure as to whether she wants to conserve her breast or not.  PREVIOUS RADIATION THERAPY: No  PAST MEDICAL HISTORY:  has a past medical history of Seizures (HCC) and Vision abnormalities.    PAST SURGICAL HISTORY: Past Surgical History:  Procedure Laterality Date   WISDOM TOOTH EXTRACTION      FAMILY HISTORY: family history includes Atrial fibrillation in her father; Colon cancer in her mother; Liver cancer in her mother; Obesity in her sister.  SOCIAL HISTORY:  reports that she has never smoked. She has never used smokeless tobacco. She reports that she does not drink alcohol or use drugs.  ALLERGIES: Penicillins  MEDICATIONS:  Current Outpatient Medications  Medication Sig Dispense Refill   Calcium Citrate (CITRACAL PO) Take by mouth. +vit d      cetirizine (ZYRTEC) 10 MG tablet Take 10 mg by mouth daily.     cholecalciferol (VITAMIN D) 1000 units tablet Take 2,000 Units by mouth daily.     gabapentin (NEURONTIN) 100 MG capsule TAKE 1 OR 2 CAPSULES AT BEDTIME 180 capsule 4   Oxcarbazepine (TRILEPTAL) 300 MG tablet Take 1 and 1/2 tablet in the morning and 2 tablets in the evening. 360 tablet 4   UNABLE TO FIND 500 mg. Med Name: Tumeric Curcumin     UNABLE TO FIND Med Name: LivCo     No current facility-administered medications for this encounter.     REVIEW OF SYSTEMS: As above  PHYSICAL EXAM:  vitals were not taken for this visit.   General: Alert and oriented, in no acute distress Skin: No concerning lesions.  There is bruising at the biopsy site of the left breast Musculoskeletal: symmetric strength and muscle tone throughout.  Good range of motion in shoulders Psychiatric: Judgment and insight are intact. Affect is appropriate. Breasts: At the 4:00 region of the left breast there is a 4 cm area of firmness. No other palpable masses appreciated in the breasts or axillae bilaterally    ECOG = 0  0 - Asymptomatic (Fully active, able to carry on all predisease activities without restriction)  1 - Symptomatic but completely ambulatory (Restricted in physically strenuous activity but ambulatory and able to carry out work of a light or sedentary nature. For example, light housework, office work)  2 - Symptomatic, <50% in bed during  the day (Ambulatory and capable of all self care but unable to carry out any work activities. Up and about more than 50% of waking hours)  3 - Symptomatic, >50% in bed, but not bedbound (Capable of only limited self-care, confined to bed or chair 50% or more of waking hours)  4 - Bedbound (Completely disabled. Cannot carry on any self-care. Totally confined to bed or chair)  5 - Death   [name] MM, Creech RH, Tormey DC, et al. [phone]). "Toxicity and response criteria of the [facility]". [place] Oncol. 5 (6): 649-55   LABORATORY DATA:  Lab Results  Component Value Date   WBC 6.0 05/21/2019   HGB 12.4 05/21/2019   HCT 39.1 05/21/2019   MCV 90.3 05/21/2019   PLT 258 05/21/2019   CMP     Component Value Date/Time   NA 141 05/21/2019 1202   NA 144 02/17/2019 1359   K 3.9 05/21/2019 1202   CL 106 05/21/2019 1202   CO2 26 05/21/2019 1202   GLUCOSE 88 05/21/2019 1202   BUN 13 05/21/2019 1202   BUN 17 02/17/2019 1359   CREATININE 0.88 05/21/2019 1202   CALCIUM 9.1 05/21/2019 1202   PROT 7.4 05/21/2019 1202   PROT 7.2 02/17/2019 1359   ALBUMIN 3.9 05/21/2019 1202   ALBUMIN 4.5 02/17/2019 1359   AST 12 (L) 05/21/2019 1202   ALT 9 05/21/2019 1202   ALKPHOS 57 05/21/2019 1202   BILITOT 0.2 (L) 05/21/2019 1202   GFRNONAA >60 05/21/2019 1202   GFRAA >60 05/21/2019 1202         RADIOGRAPHY: Mm Digital Diagnostic Unilat L  Result Date: 05/08/2019 CLINICAL DATA:  Screening recall for left breast microcalcifications. EXAM: DIGITAL DIAGNOSTIC left MAMMOGRAM COMPARISON:  Previous exam(s). ACR Breast Density Category c: The breast tissue is heterogeneously dense, which may obscure small masses. FINDINGS: Spot magnification and true lateral images demonstrate a region of pleomorphic microcalcifications over the outer midportion of the left breast in the middle third spanning 2.4 x 3.3 x 3.9 cm. These are all of the same morphology. Remainder of the exam is unchanged. IMPRESSION: Suspicious microcalcifications over the outer midportion of the left breast spanning 2.4 x 3.3 x 3.9 cm. RECOMMENDATION: Recommend stereotactic core needle biopsy for further evaluation. I have discussed the findings and recommendations with the patient. Results were also provided in writing at the conclusion of the visit. If applicable, a reminder letter will be sent to the patient regarding the next appointment. BI-RADS CATEGORY  5: Highly suggestive of malignancy. Biopsy will be  scheduled here at the [facility] prior to patient's departure. Electronically Signed   By: Marin Olp M.D.   On: 05/08/2019 14:51   Mm Digital Screening Bilateral  Result Date: 04/29/2019 CLINICAL DATA:  Screening. EXAM: DIGITAL SCREENING BILATERAL MAMMOGRAM WITH CAD COMPARISON:  Previous exam(s). ACR Breast Density Category c: The breast tissue is heterogeneously dense, which may obscure small masses. FINDINGS: In the left breast, calcifications warrant further evaluation. In the right breast, no findings suspicious for malignancy. Images were processed with CAD. IMPRESSION: Further evaluation is suggested for calcifications in the left breast. RECOMMENDATION: Diagnostic mammogram of the left breast. (Code:[id number]) The patient will be contacted regarding the findings, and additional imaging will be scheduled. BI-RADS CATEGORY  0: Incomplete. Need additional imaging evaluation and/or prior mammograms for comparison. Electronically Signed   By: Abelardo Diesel M.D.   On: 04/29/2019 13:31   Mm Clip Placement Left  Result Date: 05/14/2019 CLINICAL DATA:  Status post stereotactic guided core needle biopsy of the recently demonstrated 3.9 cm group of calcifications in the outer left breast. EXAM: DIAGNOSTIC LEFT MAMMOGRAM POST STEREOTACTIC BIOPSY COMPARISON:  Previous exam(s). FINDINGS: Mammographic images were obtained following stereotactic guided biopsy of the recently demonstrated 3.9 cm group of calcifications in the outer left breast. These demonstrate a coil shaped biopsy marker clip at the location of the biopsied calcifications. IMPRESSION: Appropriate clip deployment following left breast stereotactic guided core needle biopsy. Final Assessment: Post Procedure Mammograms for Marker Placement Electronically Signed   By: Claudie Revering M.D.   On: 05/14/2019 12:20   Mm Lt Breast Bx W Loc Dev 1st Lesion Image Bx Spec Stereo Guide  Addendum Date: 05/15/2019   ADDENDUM REPORT: 05/15/2019  12:59 ADDENDUM: Pathology revealed HIGH GRADE DUCTAL CARCINOMA IN SITU WITH CALCIFICATIONS of the Left breast, outer mid. This was found to be concordant by Dr. Claudie Revering. Pathology results were discussed with the patient by telephone. The patient reported doing well after the biopsy with tenderness at the site. Post biopsy instructions and care were reviewed and questions were answered. The patient was encouraged to call [facility] for any additional concerns. The patient was referred to [facility] on May 21, 2019. Pathology results reported by Terie Purser, RN on 05/15/2019. Electronically Signed   By: Claudie Revering M.D.   On: 05/15/2019 12:59   Result Date: 05/15/2019 CLINICAL DATA:  3.9 cm group of highly suspicious calcifications in the outer mid left breast at recent mammography. EXAM: LEFT BREAST STEREOTACTIC CORE NEEDLE BIOPSY COMPARISON:  Previous exams. FINDINGS: The patient and I discussed the procedure of stereotactic-guided biopsy including benefits and alternatives. We discussed the high likelihood of a successful procedure. We discussed the risks of the procedure including infection, bleeding, tissue injury, clip migration, and inadequate sampling. Informed written consent was given. The usual time out protocol was performed immediately prior to the procedure. Using sterile technique and 1% Lidocaine as local anesthetic, under stereotactic guidance, a 9 gauge vacuum assisted device was used to perform core needle biopsy of the recently demonstrated 3.9 cm group of calcifications in the outer left breast using a lateral approach. Specimen radiograph was performed showing a large number of calcifications in all of the specimens. Specimens with calcifications are identified for pathology. At the conclusion of the procedure, a coil shaped tissue marker clip was deployed into the biopsy cavity.  Follow-up 2-view mammogram was performed and dictated separately. IMPRESSION: Stereotactic-guided biopsy of the recently demonstrated 3.9 cm group of calcifications in the outer left breast. No apparent complications. Electronically Signed: By: Claudie Revering M.D. On: 05/14/2019 12:08      IMPRESSION/PLAN: DCIS left breast ER negative  She has been discussed at our multidisciplinary tumor board.  The consensus is that she would be a good candidate for breast conservation. I talked to her about the option of a mastectomy and informed her that her expected overall survival would be equivalent between mastectomy and breast conservation, based upon randomized controlled data. She is enthusiastic about breast conservation.  It was a pleasure meeting the patient today. We discussed the risks, benefits, and side effects of radiotherapy in the setting of breast conservation.  If she elects to undergo lumpectomy I recommend radiotherapy to the left breast to reduce her risk of locoregional recurrence by at least half.  We discussed that radiation would  take approximately 4 weeks to complete and that I would give the patient a few weeks to heal following surgery before starting treatment planning.   We spoke about acute effects including skin irritation and fatigue as well as much less common late effects including internal organ injury or irritation. We spoke about the latest technology that is used to minimize the risk of late effects for patients undergoing radiotherapy to the breast or chest wall. No guarantees of treatment were given. The patient is enthusiastic about proceeding with treatment. I look forward to participating in the patient's care.  I will await her referral back to me for postoperative follow-up and eventual CT simulation/treatment planning.   __________________________________________   Eppie Gibson, MD

## 2019-05-22 ENCOUNTER — Telehealth: Payer: Self-pay | Admitting: Hematology

## 2019-05-22 NOTE — Telephone Encounter (Signed)
No los per 6/17. °

## 2019-05-26 ENCOUNTER — Encounter (HOSPITAL_COMMUNITY)
Admission: RE | Admit: 2019-05-26 | Discharge: 2019-05-26 | Disposition: A | Discharge status: Home / Self Care | Source: Ambulatory Visit | Attending: General Surgery | Admitting: General Surgery

## 2019-05-26 ENCOUNTER — Encounter (HOSPITAL_COMMUNITY): Payer: Self-pay | Admitting: Radiology

## 2019-05-26 ENCOUNTER — Other Ambulatory Visit: Payer: Self-pay

## 2019-05-26 DIAGNOSIS — D0512 Intraductal carcinoma in situ of left breast: Secondary | ICD-10-CM | POA: Insufficient documentation

## 2019-05-26 MED ORDER — GADOBUTROL 1 MMOL/ML IV SOLN
5.0000 mL | Freq: Once | INTRAVENOUS | Status: AC | PRN
  Administered 2019-05-26: 5 mL via INTRAVENOUS

## 2019-05-28 ENCOUNTER — Other Ambulatory Visit: Payer: Self-pay | Admitting: General Surgery

## 2019-05-28 ENCOUNTER — Telehealth: Payer: Self-pay

## 2019-05-28 ENCOUNTER — Telehealth: Payer: Self-pay | Admitting: *Deleted

## 2019-05-28 DIAGNOSIS — R9389 Abnormal findings on diagnostic imaging of other specified body structures: Secondary | ICD-10-CM

## 2019-05-28 NOTE — Telephone Encounter (Signed)
Nutrition  Patient with newly diagnosis breast cancer.  Patient attended [facility] on 6/17 and received nutrition packet.     Chart reviewed.   Called patient to introduce self and service at [facility].  Reviewed nutrition information/recommendations.  Patient appreciative of call and will reach out with further questions.   Ceil Roderick B. Zenia Resides, [place], [place] Registered Dietitian [phone] (pager)

## 2019-05-28 NOTE — Telephone Encounter (Signed)
Called pt and discussed MRI results. Informed msg has been sent to Dr. Barry Dienes for review. Discussed possible bx if Dr. Barry Dienes feels it necessary. Pt anxious as this may change her mind on her tx care plan. Informed pt will call with Dr. Marlowe Aschoff recommendations. Denies further questions or needs at this time. Encourage pt to call with concerns. Received verbal understanding.

## 2019-06-02 ENCOUNTER — Encounter: Payer: Self-pay | Admitting: General Practice

## 2019-06-02 NOTE — Progress Notes (Signed)
[facility] Psychosocial Distress Screening Spiritual Care  Attempted to reach [facility] by phone following [facility] to introduce [place] team/resources, reviewing distress screen per protocol, but twice no answer and mailbox is full.  The patient scored a 2 on the Psychosocial Distress Thermometer which indicates mild distress.   ONCBCN DISTRESS SCREENING 06/02/2019  Screening Type Initial Screening  Distress experienced in past week (1-10) 2  Emotional problem type Adjusting to illness  Information Concerns Type Lack of info about diagnosis;Lack of info about treatment  Physical Problem type Sleep/insomnia  Referral to support programs Yes    Follow up needed: No. Pt received full packet of [facility] team/programming information. Please page if needs arise or circumstances change. Thank you.Marland Kitchen   [place], [place], [facility] Pager [phone] Voicemail [phone]

## 2019-06-09 ENCOUNTER — Ambulatory Visit
Admission: RE | Admit: 2019-06-09 | Discharge: 2019-06-09 | Disposition: A | Discharge status: Home / Self Care | Source: Ambulatory Visit | Attending: General Surgery | Admitting: General Surgery

## 2019-06-09 ENCOUNTER — Other Ambulatory Visit: Payer: Self-pay

## 2019-06-09 ENCOUNTER — Other Ambulatory Visit: Payer: Self-pay | Admitting: General Surgery

## 2019-06-09 DIAGNOSIS — R9389 Abnormal findings on diagnostic imaging of other specified body structures: Secondary | ICD-10-CM

## 2019-06-09 MED ORDER — GADOBUTROL 1 MMOL/ML IV SOLN
5.0000 mL | Freq: Once | INTRAVENOUS | Status: AC | PRN
  Administered 2019-06-09: 5 mL via INTRAVENOUS

## 2019-06-11 ENCOUNTER — Telehealth: Payer: Self-pay | Admitting: General Surgery

## 2019-06-11 ENCOUNTER — Other Ambulatory Visit: Payer: Self-pay | Admitting: General Surgery

## 2019-06-11 DIAGNOSIS — C50512 Malignant neoplasm of lower-outer quadrant of left female breast: Secondary | ICD-10-CM

## 2019-06-11 NOTE — Telephone Encounter (Signed)
Discussed pathology with patient. Will plan mastectomy.  She is not interested in pursuing reconstruction.  Will write orders.

## 2019-06-11 NOTE — Telephone Encounter (Signed)
See other note.  Discussed path

## 2019-06-13 ENCOUNTER — Other Ambulatory Visit: Payer: Self-pay

## 2019-06-13 ENCOUNTER — Encounter (HOSPITAL_BASED_OUTPATIENT_CLINIC_OR_DEPARTMENT_OTHER): Payer: Self-pay | Admitting: *Deleted

## 2019-06-16 ENCOUNTER — Other Ambulatory Visit (HOSPITAL_COMMUNITY)
Admission: RE | Admit: 2019-06-16 | Discharge: 2019-06-16 | Disposition: A | Discharge status: Home / Self Care | Source: Ambulatory Visit | Attending: General Surgery | Admitting: General Surgery

## 2019-06-16 DIAGNOSIS — Z1159 Encounter for screening for other viral diseases: Secondary | ICD-10-CM | POA: Insufficient documentation

## 2019-06-16 NOTE — H&P (Signed)
Sally Nguyen Documented: 06/16/2019 2:57 PM Location: [facility] Patient #: [id number] DOB: [birth year] Married / Language: Sally Nguyen / Race: White Female   History of Present Illness Sally Klein MD; 06/16/2019 4:35 PM) The patient is a 63 year old female who presents with breast cancer. Pt is a 63 yo F referred for consultation by Dr. Joneen Caraway for a new diagnosis of left breast cancer 05/2019. She had screening detected right breast calcifications in the upper outer quadrant of the left breast spanning 3.9 cm. A Core needle biopsy was performed showing high grade DCIS with calcifications. This was ER and PR negative. She has no personal history of cancer, but her mother had "colon and liver cancer." She had menarche at ag 75. She had menopause around age 64. She is a G1P1 with first child at age 81. She has not used HRT and used OCPs for around 1 year. Colonoscopy done 2018.    She underwent MRI and had an additional area that needed to be biopsied. This was also hormone negative DCIS. I discussed this with radiology and given small breast size, it was felt that it would not be amenable to lumpectomy. I talked to her on the phone and discussed the possibility of needing multiple seeds and surgeries if margins were positive. She wanted to have mastectomy.   MR breast 05/27/19 IMPRESSION: 1. 3.9 centimeter area of non mass enhancement in the LOWER OUTER QUADRANT of the LEFT breast, correlating well with the extent of calcifications seen mammographically. The clip is centrally located within this group of calcifications/non mass enhancement. 2. Small possible satellite nodule anterior to the non mass enhancement measuring 0.6 centimeters. Consider MR guided core biopsy of this lesion to define the anterior extent. If biopsy of this lesion is benign and concordant, I would recommend localization of the entire area of calcifications, to include the anterior and posterior  components.  RECOMMENDATION: MR guided core biopsy of possible satellite nodule in the retroareolar region of the LEFT breast, anterior to non mass enhancement corresponding to known malignancy in the LOWER OUTER QUADRANT of the LEFT breast.  BI-RADS CATEGORY 4: Suspicious.    Allergies (Tanisha A. Owens Shark, [place]; 06/16/2019 2:58 PM) Penicillins  Rash. Allergies Reconciled   Medication History (Tanisha A. Owens Shark, RMA; 06/16/2019 2:58 PM) Citracal (950MG  Tablet, Oral) Active. Vitamin D (1000UNIT Tablet, Oral) Active. Allegra Allergy (180MG  Tablet, Oral) Active. Neurontin (100MG  Capsule, Oral) Active. Trileptal (300MG  Tablet, Oral) Active. Turmeric (500MG  Tablet, Oral) Active. Medications Reconciled    Review of Systems Sally Klein MD; 06/16/2019 4:35 PM) All other systems negative  Vitals (Tanisha A. Brown RMA; 06/16/2019 2:58 PM) 06/16/2019 2:57 PM Weight: 123.6 lb Height: 66in Body Surface Area: 1.63 m Body Mass Index: 19.95 kg/m  Temp.: 98.39F  Pulse: 102 (Regular)  BP: 138/82(Sitting, Left Arm, Standard)       Physical Exam Sally Klein MD; 06/16/2019 4:36 PM) The physical exam findings are as follows: Note: this visit was entirely spent in counseling.    Assessment & Plan Sally Klein MD; 06/16/2019 4:37 PM)  MALIGNANT NEOPLASM OF UPPER-OUTER QUADRANT OF LEFT BREAST IN FEMALE, ESTROGEN RECEPTOR NEGATIVE (C50.412) Impression: Mastectomy with SLN on left planned for thursday. Discussed bleeding, infection, rationale for SLN bx, drain, overnight stay, recovery, post op restrictions, and other risks.  28 min spent in evaluation, examination, counseling, and coordination of care. >50% spent in counseling.  Current Plans Pt Education - [facility] Mastectomy HCI Instructed to keep follow-up appointment as scheduled  Signed by Sally Klein, MD (06/16/2019 4:38 PM)

## 2019-06-16 NOTE — Progress Notes (Signed)
Patient given Ensure presurgery with instruction to complete by 0815. She was also given CHG soap with instructions, pt voiced understanding.

## 2019-06-17 LAB — SARS CORONAVIRUS 2 (TAT 6-24 HRS): SARS Coronavirus 2: NEGATIVE

## 2019-06-18 ENCOUNTER — Telehealth: Payer: Self-pay

## 2019-06-18 NOTE — Telephone Encounter (Signed)
Signed order sent back to [facility] [place] on 06/17/2019 sent to HIM for scan to chart.

## 2019-06-19 ENCOUNTER — Encounter (HOSPITAL_BASED_OUTPATIENT_CLINIC_OR_DEPARTMENT_OTHER)
Admission: RE | Disposition: A | Discharge status: Home / Self Care | Payer: Self-pay | Source: Home / Self Care | Attending: General Surgery

## 2019-06-19 ENCOUNTER — Encounter (HOSPITAL_BASED_OUTPATIENT_CLINIC_OR_DEPARTMENT_OTHER): Payer: Self-pay | Admitting: *Deleted

## 2019-06-19 ENCOUNTER — Ambulatory Visit (HOSPITAL_COMMUNITY)
Admission: RE | Admit: 2019-06-19 | Discharge: 2019-06-19 | Disposition: A | Discharge status: Home / Self Care | Source: Ambulatory Visit | Attending: General Surgery | Admitting: General Surgery

## 2019-06-19 ENCOUNTER — Ambulatory Visit (HOSPITAL_BASED_OUTPATIENT_CLINIC_OR_DEPARTMENT_OTHER)
Admission: RE | Admit: 2019-06-19 | Discharge: 2019-06-20 | Disposition: A | Discharge status: Home / Self Care | Attending: General Surgery | Admitting: General Surgery

## 2019-06-19 ENCOUNTER — Ambulatory Visit (HOSPITAL_BASED_OUTPATIENT_CLINIC_OR_DEPARTMENT_OTHER): Admitting: Anesthesiology

## 2019-06-19 ENCOUNTER — Telehealth: Payer: Self-pay | Admitting: Hematology

## 2019-06-19 ENCOUNTER — Other Ambulatory Visit: Payer: Self-pay

## 2019-06-19 DIAGNOSIS — C50512 Malignant neoplasm of lower-outer quadrant of left female breast: Secondary | ICD-10-CM

## 2019-06-19 DIAGNOSIS — Z171 Estrogen receptor negative status [ER-]: Secondary | ICD-10-CM

## 2019-06-19 DIAGNOSIS — D0512 Intraductal carcinoma in situ of left breast: Secondary | ICD-10-CM | POA: Insufficient documentation

## 2019-06-19 DIAGNOSIS — D0592 Unspecified type of carcinoma in situ of left breast: Secondary | ICD-10-CM | POA: Diagnosis present

## 2019-06-19 HISTORY — PX: MASTECTOMY W/ SENTINEL NODE BIOPSY: SHX2001

## 2019-06-19 SURGERY — MASTECTOMY WITH SENTINEL LYMPH NODE BIOPSY
Anesthesia: General | Site: Breast | Laterality: Left

## 2019-06-19 MED ORDER — PHENYLEPHRINE HCL (PRESSORS) 10 MG/ML IV SOLN
INTRAVENOUS | Status: DC | PRN
  Administered 2019-06-19 (×4): 120 ug via INTRAVENOUS
  Administered 2019-06-19: 40 ug via INTRAVENOUS
  Administered 2019-06-19 (×3): 120 ug via INTRAVENOUS

## 2019-06-19 MED ORDER — FENTANYL CITRATE (PF) 100 MCG/2ML IJ SOLN
50.0000 ug | INTRAMUSCULAR | Status: DC | PRN
  Administered 2019-06-19 (×2): 50 ug via INTRAVENOUS

## 2019-06-19 MED ORDER — PROPOFOL 10 MG/ML IV BOLUS
INTRAVENOUS | Status: DC | PRN
  Administered 2019-06-19: 150 mg via INTRAVENOUS

## 2019-06-19 MED ORDER — CIPROFLOXACIN IN D5W 400 MG/200ML IV SOLN
INTRAVENOUS | Status: AC
  Filled 2019-06-19: qty 200

## 2019-06-19 MED ORDER — OXCARBAZEPINE 300 MG PO TABS: 450.0000 mg | ORAL_TABLET | Freq: Every day | ORAL | Status: DC

## 2019-06-19 MED ORDER — GABAPENTIN 100 MG PO CAPS
200.0000 mg | ORAL_CAPSULE | Freq: Two times a day (BID) | ORAL | Status: DC
  Administered 2019-06-19: 200 mg via ORAL

## 2019-06-19 MED ORDER — LACTATED RINGERS IV SOLN
INTRAVENOUS | Status: DC
  Administered 2019-06-19 (×2): via INTRAVENOUS

## 2019-06-19 MED ORDER — ONDANSETRON 4 MG PO TBDP: 4.0000 mg | ORAL_TABLET | Freq: Four times a day (QID) | ORAL | Status: DC | PRN

## 2019-06-19 MED ORDER — ACETAMINOPHEN 650 MG RE SUPP: 650.0000 mg | Freq: Four times a day (QID) | RECTAL | Status: DC | PRN

## 2019-06-19 MED ORDER — ONDANSETRON HCL 4 MG/2ML IJ SOLN: 4.0000 mg | Freq: Four times a day (QID) | INTRAMUSCULAR | Status: DC | PRN

## 2019-06-19 MED ORDER — DEXAMETHASONE SODIUM PHOSPHATE 4 MG/ML IJ SOLN
INTRAMUSCULAR | Status: DC | PRN
  Administered 2019-06-19: 10 mg via INTRAVENOUS

## 2019-06-19 MED ORDER — CIPROFLOXACIN IN D5W 400 MG/200ML IV SOLN
400.0000 mg | INTRAVENOUS | Status: AC
  Administered 2019-06-19: 400 mg via INTRAVENOUS

## 2019-06-19 MED ORDER — MEPERIDINE HCL 25 MG/ML IJ SOLN: 6.2500 mg | INTRAMUSCULAR | Status: DC | PRN

## 2019-06-19 MED ORDER — OXYCODONE HCL 5 MG PO TABS: 5.0000 mg | ORAL_TABLET | Freq: Once | ORAL | Status: DC | PRN

## 2019-06-19 MED ORDER — GABAPENTIN 300 MG PO CAPS
300.0000 mg | ORAL_CAPSULE | ORAL | Status: AC
  Administered 2019-06-19: 300 mg via ORAL

## 2019-06-19 MED ORDER — DEXAMETHASONE SODIUM PHOSPHATE 10 MG/ML IJ SOLN
INTRAMUSCULAR | Status: AC
  Filled 2019-06-19: qty 1

## 2019-06-19 MED ORDER — MORPHINE SULFATE (PF) 4 MG/ML IV SOLN: 1.0000 mg | INTRAVENOUS | Status: DC | PRN

## 2019-06-19 MED ORDER — DOCUSATE SODIUM 100 MG PO CAPS
100.0000 mg | ORAL_CAPSULE | Freq: Two times a day (BID) | ORAL | Status: DC
  Administered 2019-06-19: 100 mg via ORAL
  Filled 2019-06-19: qty 1

## 2019-06-19 MED ORDER — CHLORHEXIDINE GLUCONATE CLOTH 2 % EX PADS: 6.0000 | MEDICATED_PAD | Freq: Once | CUTANEOUS | Status: DC

## 2019-06-19 MED ORDER — TECHNETIUM TC 99M SULFUR COLLOID FILTERED
1.0000 | Freq: Once | INTRAVENOUS | Status: AC | PRN
  Administered 2019-06-19: 12:00:00 1 via INTRADERMAL

## 2019-06-19 MED ORDER — DIPHENHYDRAMINE HCL 50 MG/ML IJ SOLN: 12.5000 mg | Freq: Four times a day (QID) | INTRAMUSCULAR | Status: DC | PRN

## 2019-06-19 MED ORDER — METHOCARBAMOL 500 MG PO TABS
500.0000 mg | ORAL_TABLET | Freq: Four times a day (QID) | ORAL | Status: DC | PRN
  Administered 2019-06-19 – 2019-06-20 (×2): 500 mg via ORAL
  Filled 2019-06-19 (×2): qty 1

## 2019-06-19 MED ORDER — ACETAMINOPHEN 500 MG PO TABS
1000.0000 mg | ORAL_TABLET | ORAL | Status: AC
  Administered 2019-06-19: 1000 mg via ORAL

## 2019-06-19 MED ORDER — METHYLENE BLUE 0.5 % INJ SOLN
INTRAVENOUS | Status: AC
  Filled 2019-06-19: qty 10

## 2019-06-19 MED ORDER — CIPROFLOXACIN IN D5W 400 MG/200ML IV SOLN
400.0000 mg | Freq: Two times a day (BID) | INTRAVENOUS | Status: AC
  Administered 2019-06-20: 400 mg via INTRAVENOUS

## 2019-06-19 MED ORDER — ACETAMINOPHEN 500 MG PO TABS
ORAL_TABLET | ORAL | Status: AC
  Filled 2019-06-19: qty 2

## 2019-06-19 MED ORDER — LIDOCAINE HCL (CARDIAC) PF 100 MG/5ML IV SOSY
PREFILLED_SYRINGE | INTRAVENOUS | Status: DC | PRN
  Administered 2019-06-19: 100 mg via INTRAVENOUS

## 2019-06-19 MED ORDER — HYDROMORPHONE HCL 1 MG/ML IJ SOLN
0.2500 mg | INTRAMUSCULAR | Status: DC | PRN
  Administered 2019-06-19: 0.25 mg via INTRAVENOUS
  Administered 2019-06-19: 0.5 mg via INTRAVENOUS

## 2019-06-19 MED ORDER — HYDROMORPHONE HCL 1 MG/ML IJ SOLN
INTRAMUSCULAR | Status: AC
  Filled 2019-06-19: qty 0.5

## 2019-06-19 MED ORDER — TRAMADOL HCL 50 MG PO TABS: 50.0000 mg | ORAL_TABLET | Freq: Four times a day (QID) | ORAL | Status: DC | PRN

## 2019-06-19 MED ORDER — MIDAZOLAM HCL 2 MG/2ML IJ SOLN
INTRAMUSCULAR | Status: AC
  Filled 2019-06-19: qty 2

## 2019-06-19 MED ORDER — FENTANYL CITRATE (PF) 100 MCG/2ML IJ SOLN
INTRAMUSCULAR | Status: AC
  Filled 2019-06-19: qty 2

## 2019-06-19 MED ORDER — OXYCODONE HCL 5 MG/5ML PO SOLN: 5.0000 mg | Freq: Once | ORAL | Status: DC | PRN

## 2019-06-19 MED ORDER — EPHEDRINE SULFATE 50 MG/ML IJ SOLN
INTRAMUSCULAR | Status: DC | PRN
  Administered 2019-06-19: 10 mg via INTRAVENOUS

## 2019-06-19 MED ORDER — PROMETHAZINE HCL 25 MG/ML IJ SOLN: 6.2500 mg | INTRAMUSCULAR | Status: DC | PRN

## 2019-06-19 MED ORDER — GABAPENTIN 300 MG PO CAPS
ORAL_CAPSULE | ORAL | Status: AC
  Filled 2019-06-19: qty 1

## 2019-06-19 MED ORDER — OXYCODONE HCL 5 MG PO TABS
5.0000 mg | ORAL_TABLET | ORAL | Status: DC | PRN
  Administered 2019-06-19 – 2019-06-20 (×2): 5 mg via ORAL
  Filled 2019-06-19 (×2): qty 1

## 2019-06-19 MED ORDER — ONDANSETRON HCL 4 MG/2ML IJ SOLN
INTRAMUSCULAR | Status: AC
  Filled 2019-06-19: qty 2

## 2019-06-19 MED ORDER — LIDOCAINE 2% (20 MG/ML) 5 ML SYRINGE
INTRAMUSCULAR | Status: AC
  Filled 2019-06-19: qty 5

## 2019-06-19 MED ORDER — MIDAZOLAM HCL 2 MG/2ML IJ SOLN
1.0000 mg | INTRAMUSCULAR | Status: DC | PRN
  Administered 2019-06-19: 12:00:00 2 mg via INTRAVENOUS

## 2019-06-19 MED ORDER — CELECOXIB 200 MG PO CAPS
200.0000 mg | ORAL_CAPSULE | Freq: Two times a day (BID) | ORAL | Status: DC
  Administered 2019-06-19: 200 mg via ORAL
  Filled 2019-06-19: qty 1

## 2019-06-19 MED ORDER — SODIUM CHLORIDE (PF) 0.9 % IJ SOLN
INTRAMUSCULAR | Status: AC
  Filled 2019-06-19: qty 10

## 2019-06-19 MED ORDER — BUPIVACAINE-EPINEPHRINE (PF) 0.25% -1:200000 IJ SOLN
INTRAMUSCULAR | Status: AC
  Filled 2019-06-19: qty 30

## 2019-06-19 MED ORDER — PROPOFOL 10 MG/ML IV BOLUS
INTRAVENOUS | Status: AC
  Filled 2019-06-19: qty 20

## 2019-06-19 MED ORDER — DEXTROSE-NACL 5-0.45 % IV SOLN
INTRAVENOUS | Status: AC
  Administered 2019-06-19: 17:00:00 via INTRAVENOUS

## 2019-06-19 MED ORDER — OXCARBAZEPINE 300 MG PO TABS
600.0000 mg | ORAL_TABLET | Freq: Every day | ORAL | Status: DC
  Administered 2019-06-19: 600 mg via ORAL

## 2019-06-19 MED ORDER — ACETAMINOPHEN 325 MG PO TABS
650.0000 mg | ORAL_TABLET | Freq: Four times a day (QID) | ORAL | Status: DC | PRN
  Administered 2019-06-19 – 2019-06-20 (×2): 650 mg via ORAL
  Filled 2019-06-19 (×2): qty 2

## 2019-06-19 MED ORDER — SCOPOLAMINE 1 MG/3DAYS TD PT72: 1.0000 | MEDICATED_PATCH | Freq: Once | TRANSDERMAL | Status: DC

## 2019-06-19 MED ORDER — DIPHENHYDRAMINE HCL 12.5 MG/5ML PO ELIX: 12.5000 mg | ORAL_SOLUTION | Freq: Four times a day (QID) | ORAL | Status: DC | PRN

## 2019-06-19 MED ORDER — BUPIVACAINE-EPINEPHRINE (PF) 0.5% -1:200000 IJ SOLN
INTRAMUSCULAR | Status: DC | PRN
  Administered 2019-06-19: 30 mL via PERINEURAL

## 2019-06-19 SURGICAL SUPPLY — 67 items
BAG DECANTER FOR FLEXI CONT (MISCELLANEOUS) ×2 IMPLANT
BINDER BREAST LRG (GAUZE/BANDAGES/DRESSINGS) ×2 IMPLANT
BINDER BREAST MEDIUM (GAUZE/BANDAGES/DRESSINGS) IMPLANT
BINDER BREAST XLRG (GAUZE/BANDAGES/DRESSINGS) IMPLANT
BINDER BREAST XXLRG (GAUZE/BANDAGES/DRESSINGS) IMPLANT
BIOPATCH RED 1 DISK 7.0 (GAUZE/BANDAGES/DRESSINGS) IMPLANT
BLADE HEX COATED 2.75 (ELECTRODE) ×2 IMPLANT
BLADE SURG 10 STRL SS (BLADE) ×2 IMPLANT
BLADE SURG 15 STRL LF DISP TIS (BLADE) ×1 IMPLANT
BLADE SURG 15 STRL SS (BLADE) ×1
CANISTER SUCT 1200ML W/VALVE (MISCELLANEOUS) ×2 IMPLANT
CHLORAPREP W/TINT 26 (MISCELLANEOUS) ×2 IMPLANT
CLIP VESOCCLUDE LG 6/CT (CLIP) IMPLANT
CLIP VESOCCLUDE MED 6/CT (CLIP) ×4 IMPLANT
CLIP VESOCCLUDE SM WIDE 6/CT (CLIP) IMPLANT
COVER MAYO STAND REUSABLE (DRAPES) IMPLANT
COVER PROBE W GEL 5X96 (DRAPES) ×2 IMPLANT
COVER WAND RF STERILE (DRAPES) IMPLANT
DECANTER SPIKE VIAL GLASS SM (MISCELLANEOUS) IMPLANT
DERMABOND ADVANCED (GAUZE/BANDAGES/DRESSINGS) ×1
DERMABOND ADVANCED .7 DNX12 (GAUZE/BANDAGES/DRESSINGS) ×1 IMPLANT
DRAIN CHANNEL 19F RND (DRAIN) ×2 IMPLANT
DRAPE UTILITY XL STRL (DRAPES) ×2 IMPLANT
DRSG PAD ABDOMINAL 8X10 ST (GAUZE/BANDAGES/DRESSINGS) ×4 IMPLANT
ELECT BLADE 4.0 EZ CLEAN MEGAD (MISCELLANEOUS)
ELECT REM PT RETURN 9FT ADLT (ELECTROSURGICAL) ×2
ELECTRODE BLDE 4.0 EZ CLN MEGD (MISCELLANEOUS) IMPLANT
ELECTRODE REM PT RTRN 9FT ADLT (ELECTROSURGICAL) ×1 IMPLANT
EVACUATOR SILICONE 100CC (DRAIN) ×2 IMPLANT
GAUZE SPONGE 4X4 12PLY STRL (GAUZE/BANDAGES/DRESSINGS) ×2 IMPLANT
GLOVE BIO SURGEON STRL SZ 6 (GLOVE) ×2 IMPLANT
GLOVE BIOGEL PI IND STRL 6.5 (GLOVE) ×1 IMPLANT
GLOVE BIOGEL PI IND STRL 7.0 (GLOVE) ×3 IMPLANT
GLOVE BIOGEL PI INDICATOR 6.5 (GLOVE) ×1
GLOVE BIOGEL PI INDICATOR 7.0 (GLOVE) ×3
GOWN STRL REUS W/ TWL LRG LVL3 (GOWN DISPOSABLE) ×3 IMPLANT
GOWN STRL REUS W/TWL 2XL LVL3 (GOWN DISPOSABLE) ×2 IMPLANT
GOWN STRL REUS W/TWL LRG LVL3 (GOWN DISPOSABLE) ×3
LIGHT WAVEGUIDE WIDE FLAT (MISCELLANEOUS) IMPLANT
NDL SAFETY ECLIPSE 18X1.5 (NEEDLE) ×1 IMPLANT
NEEDLE HYPO 18GX1.5 SHARP (NEEDLE) ×1
NEEDLE HYPO 25X1 1.5 SAFETY (NEEDLE) ×2 IMPLANT
NEEDLE SPNL 18GX3.5 QUINCKE PK (NEEDLE) IMPLANT
NEEDLE SPNL 22GX3.5 QUINCKE BK (NEEDLE) IMPLANT
NS IRRIG 1000ML POUR BTL (IV SOLUTION) ×2 IMPLANT
PACK BASIN DAY SURGERY FS (CUSTOM PROCEDURE TRAY) ×2 IMPLANT
PACK UNIVERSAL I (CUSTOM PROCEDURE TRAY) ×2 IMPLANT
PENCIL BUTTON HOLSTER BLD 10FT (ELECTRODE) ×2 IMPLANT
PIN SAFETY STERILE (MISCELLANEOUS) ×2 IMPLANT
SLEEVE SCD COMPRESS KNEE MED (MISCELLANEOUS) ×2 IMPLANT
SPONGE LAP 18X18 RF (DISPOSABLE) ×2 IMPLANT
STAPLER VISISTAT 35W (STAPLE) IMPLANT
STOCKINETTE IMPERVIOUS LG (DRAPES) IMPLANT
STRIP CLOSURE SKIN 1/2X4 (GAUZE/BANDAGES/DRESSINGS) ×2 IMPLANT
SUT ETHILON 2 0 FS 18 (SUTURE) IMPLANT
SUT MNCRL AB 4-0 PS2 18 (SUTURE) ×8 IMPLANT
SUT SILK 0 TIES 10X30 (SUTURE) ×2 IMPLANT
SUT SILK 2 0 SH (SUTURE) IMPLANT
SUT VICRYL 3-0 CR8 SH (SUTURE) ×4 IMPLANT
SUT VICRYL AB 2 0 TIE (SUTURE) ×1 IMPLANT
SUT VICRYL AB 2 0 TIES (SUTURE) ×1
SYR BULB IRRIGATION 50ML (SYRINGE) ×2 IMPLANT
SYR CONTROL 10ML LL (SYRINGE) ×2 IMPLANT
TOWEL GREEN STERILE FF (TOWEL DISPOSABLE) ×2 IMPLANT
TUBE CONNECTING 20X1/4 (TUBING) ×2 IMPLANT
UNDERPAD 30X30 (UNDERPADS AND DIAPERS) ×2 IMPLANT
YANKAUER SUCT BULB TIP NO VENT (SUCTIONS) ×2 IMPLANT

## 2019-06-19 NOTE — Anesthesia Procedure Notes (Signed)
Anesthesia Regional Block: Pectoralis block   Pre-Anesthetic Checklist: ,, timeout performed, Correct Patient, Correct Site, Correct Laterality, Correct Procedure, Correct Position, site marked, Risks and benefits discussed,  Surgical consent,  Pre-op evaluation,  At surgeon's request and post-op pain management  Laterality: Left  Prep: chloraprep       Needles:   Needle Type: Stimiplex     Needle Length: 9cm      Additional Needles:   Procedures:,,,, ultrasound used (permanent image in chart),,,,  Narrative:  Start time: 06/19/2019 11:52 AM End time: 06/19/2019 11:58 AM Injection made incrementally with aspirations every 5 mL.  Performed by: Personally  Anesthesiologist: Nolon Nations, MD  Additional Notes: Patient tolerated well. Good fascial spread noted.

## 2019-06-19 NOTE — Discharge Instructions (Addendum)
[facility] [place] °[phone] ° °MASTECTOMY: POST OP INSTRUCTIONS ° °Always review your discharge instruction sheet given to you by the facility where your surgery was performed. °IF YOU HAVE DISABILITY OR FAMILY LEAVE FORMS, YOU MUST BRING THEM TO THE OFFICE FOR PROCESSING.   °DO NOT GIVE THEM TO YOUR DOCTOR. °A prescription for pain medication may be given to you upon discharge.  Take your pain medication as prescribed, if needed.  If narcotic pain medicine is not needed, then you may take acetaminophen (Tylenol) or ibuprofen (Advil) as needed. °1. Take your usually prescribed medications unless otherwise directed. °2. If you need a refill on your pain medication, please contact your pharmacy.  They will contact our office to request authorization.  Prescriptions will not be filled after 5pm or on week-ends. °3. You should follow a light diet the first few days after arrival home, such as soup and crackers, etc.  Resume your normal diet the day after surgery. °4. Most patients will experience some swelling and bruising on the chest and underarm.  Ice packs will help.  Swelling and bruising can take several days to resolve.  °5. It is common to experience some constipation if taking pain medication after surgery.  Increasing fluid intake and taking a stool softener (such as Colace) will usually help or prevent this problem from occurring.  A mild laxative (Milk of Magnesia or Miralax) should be taken according to package instructions if there are no bowel movements after 48 hours. °6. Unless discharge instructions indicate otherwise, leave your bandage dry and in place until your next appointment in 3-5 days.  You may take a limited sponge bath.  No tube baths or showers until the drains are removed.  You may have steri-strips (small skin tapes) in place directly over the incision.  These strips should be left on the skin for 7-10 days.  If your surgeon used skin glue on the incision, you may  shower in 24 hours.  The glue will flake off over the next 2-3 weeks.  Any sutures or staples will be removed at the office during your follow-up visit. °7. DRAINS:  If you have drains in place, it is important to keep a list of the amount of drainage produced each day in your drains.  Before leaving the hospital, you should be instructed on drain care.  Call our office if you have any questions about your drains. °8. ACTIVITIES:  You may resume regular (light) daily activities beginning the next day--such as daily self-care, walking, climbing stairs--gradually increasing activities as tolerated.  You may have sexual intercourse when it is comfortable.  Refrain from any heavy lifting or straining until approved by your doctor. °a. You may drive when you are no longer taking prescription pain medication, you can comfortably wear a seatbelt, and you can safely maneuver your car and apply brakes. °b. RETURN TO WORK:  __________________________________________________________ °9. You should see your doctor in the office for a follow-up appointment approximately 3-5 days after your surgery.  Your doctor’s nurse will typically make your follow-up appointment when she calls you with your pathology report.  Expect your pathology report 2-3 business days after your surgery.  You may call to check if you do not hear from us after three days.   °10. OTHER INSTRUCTIONS: ______________________________________________________________________________________________ ____________________________________________________________________________________________ °WHEN TO CALL YOUR DOCTOR: °1. Fever over 101.0 °2. Nausea and/or vomiting °3. Extreme swelling or bruising °4. Continued bleeding from incision. °5. Increased pain, redness, or drainage from the incision. °  The clinic staff is available to answer your questions during regular business hours.  Please dont hesitate to call and ask to speak to one of the nurses for clinical  concerns.  If you have a medical emergency, go to the nearest emergency room or call 911.  A surgeon from [facility] is always on call at the hospital. [address] [place], [place], [place]  [address] ? [address], [place], [place]   [address] [phone] ? [phone] ? FAX [phone]     [facility] drains are used to remove extra fluid that normally builds up in a surgical wound after surgery. A surgical drain helps to heal a surgical wound. Different kinds of surgical drains include:  Active drains. These drains use suction to pull drainage away from the surgical wound. Drainage flows through a tube to a container outside of the body. With these drains, you need to keep the bulb or the drainage container flat (compressed) at all times, except while you empty it. Flattening the bulb or container creates suction.  Passive drains. These drains allow fluid to drain naturally, by gravity. Drainage flows through a tube to a bandage (dressing) or a container outside of the body. Passive drains do not need to be emptied. A drain is placed during surgery. Right after surgery, drainage is usually bright red and a little thicker than water. The drainage may gradually turn yellow or pink and become thinner. It is likely that your health care provider will remove the drain when the drainage stops or when the amount decreases to 1-2 Tbsp (15-30 mL) during a 24-hour period. Supplies needed:  Tape.  Germ-free cleaning solution (sterile saline).  Cotton swabs.  Split gauze drain sponge: 4 x 4 inches (10 x 10 cm).  Gauze square: 4 x 4 inches (10 x 10 cm). How to care for your surgical drain Care for your drain as told by your health care provider. This is important to help prevent infection. If your drain is placed at your back, or any other hard-to-reach area, ask another person to assist you in performing the following tasks: General care  Keep  the skin around the drain dry and covered with a dressing at all times.  Check your drain area every day for signs of infection. Check for: ? Redness, swelling, or pain. ? Pus or a bad smell. ? Cloudy drainage. ? Tenderness or pressure at the drain exit site. Changing the dressing Follow instructions from your health care provider about how to change your dressing. Change your dressing at least once a day. Change it more often if needed to keep the dressing dry. Make sure you: 1. Gather your supplies. 2. Wash your hands with soap and water before you change your dressing. If soap and water are not available, use hand sanitizer. 3. Remove the old dressing. Avoid using scissors to do that. 4. Wash your hands with soap and water again after removing the old dressing. 5. Use sterile saline to clean your skin around the drain. You may need to use a cotton swab to clean the skin. 6. Place the tube through the slit in a drain sponge. Place the drain sponge so that it covers your wound. 7. Place the gauze square or another drain sponge on top of the drain sponge that is on the wound. Make sure the tube is between those layers. 8. Tape the dressing to your skin. 9. Tape the drainage tube to  your skin 1-2 inches (2.5-5 cm) below the place where the tube enters your body. Taping keeps the tube from pulling on any stitches (sutures) that you have. 10. Wash your hands with soap and water. 11. Write down the color of your drainage and how often you change your dressing. How to empty your active drain  1. Make sure that you have a measuring cup that you can empty your drainage into. 2. Wash your hands with soap and water. If soap and water are not available, use hand sanitizer. 3. Loosen any pins or clips that hold the tube in place. 4. If your health care provider tells you to strip the tube to prevent clots and tube blockages: ? Hold the tube at the skin with one hand. Use your other hand to pinch the  tubing with your thumb and first finger. ? Gently move your fingers down the tube while squeezing very lightly. This clears any drainage, clots, or tissue from the tube. ? You may need to do this several times each day to keep the tube clear. Do not pull on the tube. 5. Open the bulb cap or the drain plug. Do not touch the inside of the cap or the bottom of the plug. 6. Turn the device upside down and gently squeeze. 7. Empty all of the drainage into the measuring cup. 8. Compress the bulb or the container and replace the cap or the plug. To compress the bulb or the container, squeeze it firmly in the middle while you close the cap or plug the container. 9. Write down the amount of drainage that you have in each 24-hour period. If you have less than 2 Tbsp (30 mL) of drainage during 24 hours, contact your health care provider. 10. Flush the drainage down the toilet. 11. Wash your hands with soap and water. Contact a health care provider if:  You have redness, swelling, or pain around your drain area.  You have pus or a bad smell coming from your drain area.  You have a fever or chills.  The skin around your drain is warm to the touch.  The amount of drainage that you have is increasing instead of decreasing.  You have drainage that is cloudy.  There is a sudden stop or a sudden decrease in the amount of drainage that you have.  Your drain tube falls out.  Your active drain does not stay compressed after you empty it. Summary  Surgical drains are used to remove extra fluid that normally builds up in a surgical wound after surgery.  Different kinds of surgical drains include active drains and passive drains. Active drains use suction to pull drainage away from the surgical wound, and passive drains allow fluid to drain naturally.  It is important to care for your drain to prevent infection. If your drain is placed at your back, or any other hard-to-reach area, ask another person to  assist you.  Contact your health care provider if you have redness, swelling, or pain around your drain area. This information is not intended to replace advice given to you by your health care provider. Make sure you discuss any questions you have with your health care provider. Document Released: 11/17/2000 Document Revised: 12/25/2018 Document Reviewed: 12/25/2018 Elsevier Patient Education  2020 [employer].  About my Jackson-Pratt Bulb Drain  What is a Jackson-Pratt bulb? A Jackson-Pratt is a soft, round device used to collect drainage. It is connected to a long, thin drainage catheter, which  is held in place by one or two small stiches near your surgical incision site. When the bulb is squeezed, it forms a vacuum, forcing the drainage to empty into the bulb.  Emptying the Jackson-Pratt bulb- To empty the bulb: 1. Release the plug on the top of the bulb. 2. Pour the bulb's contents into a measuring container which your nurse will provide. 3. Record the time emptied and amount of drainage. Empty the drain(s) as often as your     doctor or nurse recommends.  Date                  Time                    Amount (Drain 1)                 Amount (Drain 2)  _____________________________________________________________________  _____________________________________________________________________  _____________________________________________________________________  _____________________________________________________________________  _____________________________________________________________________  _____________________________________________________________________  _____________________________________________________________________  _____________________________________________________________________  Squeezing the Jackson-Pratt Bulb- To squeeze the bulb: 1. Make sure the plug at the top of the bulb is open. 2. Squeeze the bulb tightly in your fist. You will hear  air squeezing from the bulb. 3. Replace the plug while the bulb is squeezed. 4. Use a safety pin to attach the bulb to your clothing. This will keep the catheter from     pulling at the bulb insertion site.  When to call your doctor- Call your doctor if:  Drain site becomes red, swollen or hot.  You have a fever greater than 101 degrees F.  There is oozing at the drain site.  Drain falls out (apply a guaze bandage over the drain hole and secure it with tape).  Drainage increases daily not related to activity patterns. (You will usually have more drainage when you are active than when you are resting.)  Drainage has a bad odor.

## 2019-06-19 NOTE — Interval H&P Note (Signed)
History and Physical Interval Note:  06/19/2019 12:25 PM  Sally Nguyen  has presented today for surgery, with the diagnosis of LEFT BREAST CANCER.  The various methods of treatment have been discussed with the patient and family. After consideration of risks, benefits and other options for treatment, the patient has consented to  Procedure(s): LEFT MASTECTOMY WITH SENTINEL LYMPH NODE BIOPSY (Left) as a surgical intervention.  The patient's history has been reviewed, patient examined, no change in status, stable for surgery.  I have reviewed the patient's chart and labs.  Questions were answered to the patient's satisfaction.     Stark Klein

## 2019-06-19 NOTE — Transfer of Care (Signed)
Immediate Anesthesia Transfer of Care Note  Patient: Sally Nguyen  Procedure(s) Performed: LEFT MASTECTOMY WITH SENTINEL LYMPH NODE BIOPSY (Left Breast)  Patient Location: PACU  Anesthesia Type:General and Regional  Level of Consciousness: awake, alert  and oriented  Airway & Oxygen Therapy: Patient Spontanous Breathing and Patient connected to nasal cannula oxygen  Post-op Assessment: Report given to RN and Post -op Vital signs reviewed and stable  Post vital signs: Reviewed and stable  Last Vitals:  Vitals Value Taken Time  BP 112/59 06/19/19 1417  Temp    Pulse 78 06/19/19 1420  Resp 12 06/19/19 1420  SpO2 100 % 06/19/19 1420  Vitals shown include unvalidated device data.  Last Pain:  Vitals:   06/19/19 1044  TempSrc: Oral  PainSc: 0-No pain         Complications: No apparent anesthesia complications

## 2019-06-19 NOTE — Anesthesia Preprocedure Evaluation (Signed)
Anesthesia Evaluation  Patient identified by MRN, date of birth, ID band Patient awake    Reviewed: Allergy & Precautions, NPO status , Patient's Chart, lab work & pertinent test results  Airway Mallampati: II  TM Distance: >3 FB Neck ROM: Full    Dental  (+) Dental Advisory Given   Pulmonary neg pulmonary ROS,    Pulmonary exam normal breath sounds clear to auscultation       Cardiovascular negative cardio ROS Normal cardiovascular exam Rhythm:Regular Rate:Normal     Neuro/Psych Seizures -, Well Controlled,  negative psych ROS   GI/Hepatic negative GI ROS, Neg liver ROS,   Endo/Other  negative endocrine ROS  Renal/GU negative Renal ROS     Musculoskeletal negative musculoskeletal ROS (+)   Abdominal   Peds  Hematology negative hematology ROS (+)   Anesthesia Other Findings   Reproductive/Obstetrics negative OB ROS                             Anesthesia Physical Anesthesia Plan  ASA: II  Anesthesia Plan: General   Post-op Pain Management: GA combined w/ Regional for post-op pain   Induction: Intravenous  PONV Risk Score and Plan: 4 or greater and Ondansetron, Dexamethasone, Treatment may vary due to age or medical condition and Midazolam  Airway Management Planned: LMA  Additional Equipment:   Intra-op Plan:   Post-operative Plan: Extubation in OR  Informed Consent: I have reviewed the patients History and Physical, chart, labs and discussed the procedure including the risks, benefits and alternatives for the proposed anesthesia with the patient or authorized representative who has indicated his/her understanding and acceptance.     Dental advisory given  Plan Discussed with: CRNA  Anesthesia Plan Comments:         Anesthesia Quick Evaluation

## 2019-06-19 NOTE — Progress Notes (Signed)
Nuc med inj performed by nuc med staff. Pt tol well with no additional sedation required. VSS. Emotional support provided

## 2019-06-19 NOTE — Anesthesia Procedure Notes (Signed)
Procedure Name: LMA Insertion Performed by: Verita Lamb, CRNA Pre-anesthesia Checklist: Patient identified, Emergency Drugs available, Patient being monitored, Suction available and Timeout performed Patient Re-evaluated:Patient Re-evaluated prior to induction Oxygen Delivery Method: Circle system utilized Preoxygenation: Pre-oxygenation with 100% oxygen Induction Type: IV induction LMA: LMA inserted LMA Size: 4.0 Tube type: Oral Number of attempts: 1 Placement Confirmation: CO2 detector,  positive ETCO2 and breath sounds checked- equal and bilateral Tube secured with: Tape Dental Injury: Teeth and Oropharynx as per pre-operative assessment

## 2019-06-19 NOTE — Progress Notes (Signed)
Assisted Dr. Germeroth with left, ultrasound guided, pectoralis block. Side rails up, monitors on throughout procedure. See vital signs in flow sheet. Tolerated Procedure well. 

## 2019-06-19 NOTE — Anesthesia Postprocedure Evaluation (Signed)
Anesthesia Post Note  Patient: Sally Nguyen  Procedure(s) Performed: LEFT MASTECTOMY WITH SENTINEL LYMPH NODE BIOPSY (Left Breast)     Patient location during evaluation: PACU Anesthesia Type: General Level of consciousness: awake and alert Pain management: pain level controlled Vital Signs Assessment: post-procedure vital signs reviewed and stable Respiratory status: spontaneous breathing, nonlabored ventilation, respiratory function stable and patient connected to nasal cannula oxygen Cardiovascular status: blood pressure returned to baseline and stable Postop Assessment: no apparent nausea or vomiting Anesthetic complications: no    Last Vitals:  Vitals:   06/19/19 1454 06/19/19 1500  BP:  121/86  Pulse: 85 87  Resp: (!) 23 15  Temp:    SpO2: 100% 100%    Last Pain:  Vitals:   06/19/19 1454  TempSrc:   PainSc: 6                  Catalina Gravel

## 2019-06-19 NOTE — Telephone Encounter (Signed)
Scheduled appt per sch message 7/14 - husband aware appt added - reminder letter mailed.

## 2019-06-19 NOTE — Op Note (Signed)
left Mastectomy with Sentinel Node Biopsy Procedure Note  Indications: This patient presents with history of left breast cancer, multifocal cTis  Pre-operative Diagnosis: Left breast cancer, cTis high grade, receptors -/-  Post-operative Diagnosis: same  Surgeon: Stark Klein   Anesthesia: General endotracheal anesthesia and pectoral block  ASA Class: 2  Procedure Details  The patient was seen in the Holding Room. The risks, benefits, complications, treatment options, and expected outcomes were discussed with the patient. The possibilities of reaction to medication, pulmonary aspiration, bleeding, infection, the need for additional procedures, failure to diagnose a condition, and creating a complication requiring transfusion or operation were discussed with the patient. The patient concurred with the proposed plan, giving informed consent.  The site of surgery properly noted/marked. The patient was taken to Operating Room # 2, identified as Sally Nguyen and the procedure verified as Left Mastectomy and Sentinel Node Biopsy. A Time Out was held and the above information confirmed.    After induction of anesthesia, the left arm, breast, and chest were prepped and draped in standard fashion.  The borders of the breast were identified and marked.  The incisions of the breast were drawn out to make sure incision lines were equidistant in length.    The superior incision was made with the #10 blade.  Mastectomy hooks were used to provide elevation of the skin edges, and the cautery was used to create the mastectomy flaps.  The dissection was taken to the fascia of the pectoralis major.  The penetrating vessels were clipped as needed.  The superior flap was taken medially to the lateral sternal border, superiorly to the inferior border of the clavicle.  The inferior flap was similarly created, inferiorly to the inframammary fold and laterally to the border of the latissimus.  The breast was taken off  including the pectoralis fascia and the axillary tail marked.    Using a hand-held gamma probe, axillary sentinel nodes were identified.  Two deep level 2 axillary sentinel nodes were removed and submitted to pathology.  The findings are below.  The lymphovascular channels were clipped with metal clips.        The wound was irrigated. One 19 Blake drain was placed laterally.   Hemostasis was achieved with cautery.  The wound was irrigated and closed with a 3-0 Vicryl deep dermal interrupted sutures and 4-0 Vicryl subcuticular closure in layers.    Sterile dressings were applied. At the end of the operation, all sponge, instrument, and needle counts were correct.  Findings: grossly clear surgical margins, SLN #1 cps 1150, SLN #2 csp 370, background 5  Estimated Blood Loss: min          Drains: 19 Fr blake drain                Specimens: left breast and two deep left axillary sentinel nodes         Complications:  None; patient tolerated the procedure well.         Disposition: PACU - hemodynamically stable.         Condition: stable

## 2019-06-20 ENCOUNTER — Encounter (HOSPITAL_BASED_OUTPATIENT_CLINIC_OR_DEPARTMENT_OTHER): Payer: Self-pay | Admitting: General Surgery

## 2019-06-20 DIAGNOSIS — D0512 Intraductal carcinoma in situ of left breast: Secondary | ICD-10-CM | POA: Diagnosis not present

## 2019-06-20 MED ORDER — DOCUSATE SODIUM 100 MG PO CAPS: 100.0000 mg | ORAL_CAPSULE | Freq: Two times a day (BID) | ORAL | 0 refills | Status: DC

## 2019-06-20 MED ORDER — OXYCODONE HCL 5 MG PO TABS: 5.0000 mg | ORAL_TABLET | Freq: Four times a day (QID) | ORAL | 0 refills | Status: DC | PRN

## 2019-06-20 MED ORDER — GABAPENTIN 100 MG PO CAPS: 200.0000 mg | ORAL_CAPSULE | Freq: Two times a day (BID) | ORAL | 1 refills | Status: DC

## 2019-06-20 MED ORDER — ACETAMINOPHEN 500 MG PO TABS: 1000.0000 mg | ORAL_TABLET | Freq: Three times a day (TID) | ORAL | 2 refills | Status: DC

## 2019-06-20 MED ORDER — METHOCARBAMOL 500 MG PO TABS: 500.0000 mg | ORAL_TABLET | Freq: Three times a day (TID) | ORAL | 1 refills | Status: DC | PRN

## 2019-06-20 NOTE — Discharge Summary (Signed)
Physician Discharge Summary  Patient ID: Sally Nguyen MRN: [id number] DOB/AGE: Aug 25, 1956 63 y.o.  Admit date: 06/19/2019 Discharge date: 06/20/2019  Admission Diagnoses: Patient Active Problem List   Diagnosis Date Noted  . Breast cancer, stage 0, left 06/19/2019  . Ductal carcinoma in situ (DCIS) of left breast 05/15/2019  . Insomnia 05/09/2018  . 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 Partial symptomatic epilepsy with complex partial seizures, not intractable, without status epilepticus ([place]) 04/10/2017    Discharge Diagnoses:  Active Problems:   Breast cancer, stage 0, left and same as above  Discharged Condition: stable  Hospital Course:  Pt was admitted for observation following left mastectomy with SLN bx.  She had a good night with minimal pain.  She has a fair amount of numbness on her upper skin flap.  She was able to void.  She and her sister were able to manage the drain.  She had no evidence of hematoma.  She is discharged in stable condition.    Consults: None  Significant Diagnostic Studies: n/a  Treatments: surgery: see above  Discharge Exam: Blood pressure 112/71, pulse 81, temperature 98 F (36.7 C), resp. rate 18, height 5\' 6"  (1.676 m), weight 56.1 kg, SpO2 97 %. General appearance: alert, cooperative and no distress Resp: breathing comfortably Chest wall: anticipated left sided tenderness.  no hematoma.  drain serosang.    Disposition:   Discharge Instructions    Call MD for:  difficulty breathing, headache or visual disturbances   Complete by: As directed    Call MD for:  hives   Complete by: As directed    Call MD for:  persistant nausea and vomiting   Complete by: As directed    Call MD for:  redness, tenderness, or signs of infection (pain, swelling, redness, odor or green/yellow discharge around incision site)   Complete by: As directed    Call MD for:  severe uncontrolled pain   Complete by: As directed    Call MD for:   temperature >100.4   Complete by: As directed    Change dressing (specify)   Complete by: As directed    Measure and record drain output at least twice daily.  Bring record to clinic.  Call if drain is putting out 20 mL or less three days in a row if before scheduled appointment.   Diet - low sodium heart healthy   Complete by: As directed    Increase activity slowly   Complete by: As directed      Allergies as of 06/20/2019      Reactions   Penicillins Rash      Medication List    TAKE these medications   acetaminophen 500 MG tablet Commonly known as: TYLENOL Take 2 tablets (1,000 mg total) by mouth 3 (three) times daily.   cetirizine 10 MG tablet Commonly known as: ZYRTEC Take 10 mg by mouth daily.   cholecalciferol 1000 units tablet Commonly known as: VITAMIN D Take 2,000 Units by mouth daily.   CITRACAL PO Take by mouth. +vit d   docusate sodium 100 MG capsule Commonly known as: COLACE Take 1 capsule (100 mg total) by mouth 2 (two) times daily.   gabapentin 100 MG capsule Commonly known as: NEURONTIN TAKE 1 OR 2 CAPSULES AT BEDTIME What changed: Another medication with the same name was added. Make sure you understand how and when to take each.   gabapentin 100 MG capsule Commonly known as: NEURONTIN Take 2 capsules (200 mg  total) by mouth 2 (two) times daily. What changed: You were already taking a medication with the same name, and this prescription was added. Make sure you understand how and when to take each.   methocarbamol 500 MG tablet Commonly known as: ROBAXIN Take 1 tablet (500 mg total) by mouth every 8 (eight) hours as needed for muscle spasms.   Oxcarbazepine 300 MG tablet Commonly known as: TRILEPTAL Take 1 and 1/2 tablet in the morning and 2 tablets in the evening.   oxyCODONE 5 MG immediate release tablet Commonly known as: Oxy IR/ROXICODONE Take 1-2 tablets (5-10 mg total) by mouth every 6 (six) hours as needed for moderate pain.    UNABLE TO FIND 500 mg. Med Name: Tumeric Curcumin   UNABLE TO FIND Med Name: [facility]            Discharge Care Instructions  (From admission, onward)         Start     Ordered   06/20/19 0000  Change dressing (specify)    Comments: Measure and record drain output at least twice daily.  Bring record to clinic.  Call if drain is putting out 20 mL or less three days in a row if before scheduled appointment.   06/20/19 0912         Follow-up Information    Stark Klein, MD In 2 weeks.   Specialty: General Surgery Contact information: [address] [place] [address] [phone]           Signed: Stark Klein 06/20/2019, 5:34 PM

## 2019-06-24 NOTE — Progress Notes (Signed)
Please let patient know that NO invasive cancer was seen, margins and lymph nodes are negative.

## 2019-07-11 ENCOUNTER — Encounter: Payer: Self-pay | Admitting: Rehabilitation

## 2019-07-11 ENCOUNTER — Ambulatory Visit: Attending: General Surgery | Admitting: Rehabilitation

## 2019-07-11 ENCOUNTER — Other Ambulatory Visit: Payer: Self-pay

## 2019-07-11 DIAGNOSIS — Z483 Aftercare following surgery for neoplasm: Secondary | ICD-10-CM | POA: Diagnosis present

## 2019-07-11 DIAGNOSIS — R293 Abnormal posture: Secondary | ICD-10-CM | POA: Insufficient documentation

## 2019-07-11 DIAGNOSIS — M25612 Stiffness of left shoulder, not elsewhere classified: Secondary | ICD-10-CM | POA: Insufficient documentation

## 2019-07-11 NOTE — Therapy (Signed)
[place], [place], [address] Phone: [phone]   Fax:  [phone]  Physical Therapy Evaluation  Patient Details  Name: Sally Nguyen MRN: [id number] Date of Birth: 03/29/1956 Referring Provider (PT): Dr. Barry Dienes   Encounter Date: 07/11/2019  PT End of Session - 07/11/19 1156    Visit Number  1    Number of Visits  9    Date for PT Re-Evaluation  08/22/19    Authorization Type  tricare humama- needs humana approbal    PT Start Time  1100    PT Stop Time  1145    PT Time Calculation (min)  45 min    Activity Tolerance  Patient tolerated treatment well    Behavior During Therapy  [facility] for tasks assessed/performed       Past Medical History:  Diagnosis Date  . Cancer ([place]) 05/2019   left breast DCIS  . Seizures ([place])   . Vision abnormalities     Past Surgical History:  Procedure Laterality Date  . COLONOSCOPY    . MASTECTOMY W/ SENTINEL NODE BIOPSY Left 06/19/2019   Procedure: LEFT MASTECTOMY WITH SENTINEL LYMPH NODE BIOPSY;  Surgeon: Stark Klein, MD;  Location: [facility];  Service: General;  Laterality: Left;  . WISDOM TOOTH EXTRACTION      There were no vitals filed for this visit.   Subjective Assessment - 07/11/19 1104    Subjective  Starting to feel better. I do have some numbness on the good side which is weird.  My chest is very tight. I haven't had to change to stretch yet.    Pertinent History  Lt mastectomy and SLNB on 06/19/19 by Dr. Barry Dienes with 2 nodes removed. ER/PR negative HER2 positive.  History of seizures. No other treatments needed    Limitations  Lifting;Other (comment)   reachingup is hard   Patient Stated Goals  get back to normal    Currently in Pain?  No/denies   just with movement        [facility] PT Assessment - 07/11/19 0001      Assessment   Medical Diagnosis  Lt mastectomy    Referring Provider (PT)  Dr. Barry Dienes    Onset Date/Surgical  Date  06/19/19    Hand Dominance  Right    Prior Therapy  no      Precautions   Precaution Comments  lymphedema risk      Restrictions   Weight Bearing Restrictions  No      Balance Screen   Has the patient fallen in the past 6 months  No      [place] residence    Living Arrangements  Spouse/significant other;Children    Available Help at Discharge  Family    Additional Comments  husband is 44 and needs assistance      Prior Function   Level of Independence  Independent    Vocation  Unemployed    Leisure  irish drumming (need to get that under the Lt armpit)      Cognition   Overall Cognitive Status  Within Functional Limits for tasks assessed      Observation/Other Assessments   Observations  healing incision with steristrips       Sensation   Additional Comments  numbness axilla and shoulder      Coordination   Gross Motor Movements are Fluid and Coordinated  Yes  Posture/Postural Control   Posture/Postural Control  Postural limitations    Postural Limitations  Rounded Shoulders;Forward head      ROM / Strength   AROM / PROM / Strength  AROM      AROM   Overall AROM Comments  all with pull on the Lt    AROM Assessment Site  Shoulder    Right/Left Shoulder  Right;Left    Right Shoulder Extension  66 Degrees    Right Shoulder Flexion  162 Degrees    Right Shoulder ABduction  170 Degrees    Right Shoulder Internal Rotation  45 Degrees    Right Shoulder External Rotation  92 Degrees    Right Shoulder Horizontal ABduction  32 Degrees    Left Shoulder Extension  60 Degrees    Left Shoulder Flexion  120 Degrees    Left Shoulder ABduction  92 Degrees   more in a scaption plane   Left Shoulder Internal Rotation  45 Degrees    Left Shoulder External Rotation  85 Degrees    Left Shoulder Horizontal ABduction  --   -30       LYMPHEDEMA/ONCOLOGY QUESTIONNAIRE - 07/11/19 1122      Type   Cancer Type  Lt mastectomy       Surgeries   Mastectomy Date  06/19/19    Sentinel Lymph Node Biopsy Date  06/19/19    Number Lymph Nodes Removed  2      Treatment   Active Chemotherapy Treatment  No    Past Chemotherapy Treatment  No    Active Radiation Treatment  No    Past Radiation Treatment  No    Current Hormone Treatment  No    Past Hormone Therapy  No      What other symptoms do you have   Are you Having Heaviness or Tightness  No      Lymphedema Assessments   Lymphedema Assessments  Upper extremities      Right Upper Extremity Lymphedema   10 cm Proximal to Olecranon Process  24.4 cm    Olecranon Process  24.1 cm    10 cm Proximal to Ulnar Styloid Process  20.2 cm    Just Proximal to Ulnar Styloid Process  16.2 cm    Across Hand at [employer]  18.4 cm    At [facility] of 2nd Digit  6.5 cm      Left Upper Extremity Lymphedema   15 cm Proximal to Olecranon Process  24.5 cm    10 cm Proximal to Olecranon Process  23.7 cm    Olecranon Process  23.6 cm    15 cm Proximal to Ulnar Styloid Process  20.8 cm    10 cm Proximal to Ulnar Styloid Process  18.2 cm    Just Proximal to Ulnar Styloid Process  16.2 cm    Across Hand at [employer]  18.5 cm    At [facility] of 2nd Digit  6.1 cm    Other  8.5             Objective measurements completed on examination: See above findings.      [facility] Adult PT Treatment/Exercise - 07/11/19 0001      Self-Care   Self-Care  Other Self-Care Comments    Other Self-Care Comments   lymphedema and risk reduction education      Exercises   Exercises  Other Exercises    Other Exercises   performed HEP of supine dowel 5"x10,  dowel ER 5"x 10, supine arm to the side with breathing stretch             PT Education - 07/11/19 1155    Education Details  HEP, POC    Person(s) Educated  Patient    Methods  Explanation;Demonstration;Tactile cues;Verbal cues;Handout    Comprehension  Verbalized understanding;Returned demonstration;Verbal cues required;Tactile  cues required          PT Long Term Goals - 07/11/19 1159      PT LONG TERM GOAL #1   Title  Pt will improve lt shoulder abduction to 155 or greater    Baseline  92    Time  4    Period  Weeks    Status  New      PT LONG TERM GOAL #2   Title  Pt will improve Lt horizontal abduction to 30deg or greater    Baseline  -30    Time  4    Period  Weeks    Status  New      PT LONG TERM GOAL #3   Title  Pt will return to all daily activities without limitations - including irish drumming    Time  4    Period  Weeks    Status  New      PT LONG TERM GOAL #4   Title  Pt will be knowledgeable about lmyphedema and risk reduction    Time  4    Period  Weeks    Status  Achieved             Plan - 07/11/19 1157    Clinical Impression Statement  Pt presents post Lt mastectomy and removal of 2 negative lymph nodes with decreased Lt shoulder ROM, pectoralis spasm, and limited UE use.  No signs of swelling and lymphedema but pt was educated on this and risk reduction    Examination-Activity Limitations  Lift;Caring for Others;Carry    Examination-Participation Restrictions  [employer]    Stability/Clinical Decision Making  Stable/Uncomplicated    Clinical Decision Making  Low    Rehab Potential  Excellent    PT Frequency  2x / week    PT Duration  4 weeks    PT Treatment/Interventions  ADLs/Self Care Home Management;Manual lymph drainage;Manual techniques;Therapeutic exercise;Therapeutic activities;Patient/family education    PT Next Visit Plan  recheck ROM, Lt shoulder PROM STM, scar mobilization    PT Home Exercise Plan  Access Code: [email or web address]    Consulted and Agree with Plan of Care  Patient       Patient will benefit from skilled therapeutic intervention in order to improve the following deficits and impairments:  Decreased skin integrity, Increased fascial restricitons, Decreased scar mobility, Pain, Decreased range of motion, Impaired UE functional  use  Visit Diagnosis: 1. Aftercare following surgery for neoplasm   2. Stiffness of left shoulder, not elsewhere classified   3. Abnormal posture        Problem List Patient Active Problem List   Diagnosis Date Noted  . Breast cancer, stage 0, left 06/19/2019  . Ductal carcinoma in situ (DCIS) of left breast 05/15/2019  . Insomnia 05/09/2018  . 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 Partial symptomatic epilepsy with complex partial seizures, not intractable, without status epilepticus ([place]) 04/10/2017    Stark Bray 07/11/2019, 12:01 PM  [place], [place], [address] Phone: [phone]   Fax:  [phone]  Name: Neha  MAKEYA HILGERT MRN: [id number] Date of Birth: 03-10-1956

## 2019-07-11 NOTE — Patient Instructions (Signed)
Access Code: [email or web address]  URL: [email or web address]  Date: 07/11/2019  Prepared by: Shan Levans   Exercises  Supine Shoulder Flexion Extension AAROM with Dowel - 10 reps - 5 seconds hold - 1-2x daily - 7x weekly  Supine Shoulder External Rotation with Dowel - 10 reps - 5 sseconds hold - 1x daily - 7x weekly  Supine Chest Stretch on Foam Roll - 1 reps - 20-60 seconds hold - 2-3x daily - 7x weekly

## 2019-07-18 ENCOUNTER — Telehealth: Payer: Self-pay | Admitting: Hematology

## 2019-07-18 NOTE — Progress Notes (Signed)
[name]   [phone] [phone]   Clinic Follow up Note   Patient Care Team: [name], [place] as PCP - General (General Practice) Mauro Kaufmann, RN as Oncology Nurse Navigator Rockwell Germany, RN as Oncology Nurse Navigator Stark Klein, MD as Consulting Physician (General Surgery) Truitt Merle, MD as Consulting Physician (Hematology) Eppie Gibson, MD as Attending Physician (Radiation Oncology)   I connected with Sally Nguyen on 07/21/2019 at 11:15 AM EDT by video enabled telemedicine visit and verified that I am speaking with the correct person using two identifiers.  I discussed the limitations, risks, security and privacy concerns of performing an evaluation and management service by telephone and the availability of in person appointments. I also discussed with the patient that there may be a patient responsible charge related to this service. The patient expressed understanding and agreed to proceed.   Patient's location:  Her home  Provider's location:  My Office   CHIEF COMPLAINT: F/u of left breast DCIS   SUMMARY OF ONCOLOGIC HISTORY: Oncology History  Ductal carcinoma in situ (DCIS) of left breast  05/08/2019 Mammogram   Mammogram 05/08/19  IMPRESSION: Suspicious microcalcifications over the outer midportion of the left breast spanning 2.4 x 3.3 x 3.9 cm.   05/14/2019 Initial Biopsy   Diagnosis 05/14/19  Breast, left, needle core biopsy, outer mid - DUCTAL CARCINOMA IN SITU WITH CALCIFICATIONS. - SEE COMMENT. Estrogen Receptor: 0%, NEGATIVE Progesterone Receptor: 0%, NEGATIVE   05/14/2019 Cancer Staging   Staging form: Breast, AJCC 8th Edition - Clinical stage from 05/14/2019: Stage Unknown (cTis (DCIS), cNX, cM0, ER-, PR-, HER2: Not Assessed) - Signed by Truitt Merle, MD on 05/20/2019   05/15/2019 Initial Diagnosis   Ductal carcinoma in situ (DCIS) of left breast   05/27/2019 Breast MRI   Breast MRI 05/27/19  IMPRESSION: 1.  3.9 centimeter area of non mass enhancement in the LOWER OUTER QUADRANT of the LEFT breast, correlating well with the extent of calcifications seen mammographically. The clip is centrally located within this group of calcifications/non mass enhancement. 2. Small possible satellite nodule anterior to the non mass enhancement measuring 0.6 centimeters. Consider MR guided core biopsy of this lesion to define the anterior extent. If biopsy of this lesion is benign and concordant, I would recommend localization of the entire area of calcifications, to include the anterior and posterior components.   06/09/2019 Pathology Results   Diagnosis 06/09/19 Breast, left, needle core biopsy, LOQ - DUCTAL CARCINOMA IN SITU WITH CALCIFICATIONS AND NECROSIS, SEE COMMENT. Results: IMMUNOHISTOCHEMICAL AND MORPHOMETRIC ANALYSIS PERFORMED MANUALLY Estrogen Receptor: 0%, NEGATIVE Progesterone Receptor: 0%, NEGATIVE   06/19/2019 Surgery   LEFT MASTECTOMY WITH SENTINEL LYMPH NODE BIOPSY by Dr. Barry Dienes  06/19/19    06/19/2019 Pathology Results   Diagnosis 06/19/19 1. Breast, simple mastectomy, Left - DUCTAL CARCINOMA IN SITU, HIGH GRADE, WITH CALCIFICATIONS AND NECROSIS - MARGINS UNINVOLVED BY CARCINOMA (2.5 CM; POSTERIOR) - PREVIOUS BIOPSY SITE CHANGES - SEE ONCOLOGY TABLE AND COMMENT BELOW 2. Lymph node, sentinel, biopsy, Left #1 - NO CARCINOMA IDENTIFIED IN ONE LYMPH NODE (0/1) 3. Lymph node, sentinel, biopsy, Left #2 - NO CARCINOMA IDENTIFIED IN ONE LYMPH NODE (0/1)      CURRENT THERAPY:  Surveillance   INTERVAL HISTORY:  Sally Nguyen is here for a follow up of left breast DCIS post surgery. They identified themselves by face to face video. She notes her surgery went well. She has breast numbness of her left breast and her left  arm. She started PT 2 weeks ago. She notes progress in her mobility nad ROM.    REVIEW OF SYSTEMS:   Constitutional: Denies fevers, chills or abnormal weight loss Eyes:  Denies blurriness of vision Ears, nose, mouth, throat, and face: Denies mucositis or sore throat Respiratory: Denies cough, dyspnea or wheezes Cardiovascular: Denies palpitation, chest discomfort or lower extremity swelling Gastrointestinal:  Denies nausea, heartburn or change in bowel habits Skin: Denies abnormal skin rashes Lymphatics: Denies new lymphadenopathy or easy bruising Neurological:Denies numbness, tingling or new weaknesses Behavioral/Psych: Mood is stable, no new changes  Breast: (+) Left breast and arm numbness and lymphedema  All other systems were reviewed with the patient and are negative.  MEDICAL HISTORY:  Past Medical History:  Diagnosis Date  . Cancer ([place]) 05/2019   left breast DCIS  . Seizures ([place])   . Vision abnormalities     SURGICAL HISTORY: Past Surgical History:  Procedure Laterality Date  . COLONOSCOPY    . MASTECTOMY W/ SENTINEL NODE BIOPSY Left 06/19/2019   Procedure: LEFT MASTECTOMY WITH SENTINEL LYMPH NODE BIOPSY;  Surgeon: Stark Klein, MD;  Location: [place];  Service: General;  Laterality: Left;  . WISDOM TOOTH EXTRACTION      I have reviewed the social history and family history with the patient and they are unchanged from previous note.  ALLERGIES:  is allergic to penicillins.  MEDICATIONS:  Current Outpatient Medications  Medication Sig Dispense Refill  . acetaminophen (TYLENOL) 500 MG tablet Take 2 tablets (1,000 mg total) by mouth 3 (three) times daily. 100 tablet 2  . Calcium Citrate (CITRACAL PO) Take by mouth. +vit d    . cetirizine (ZYRTEC) 10 MG tablet Take 10 mg by mouth daily.    . cholecalciferol (VITAMIN D) 1000 units tablet Take 2,000 Units by mouth daily.    Marland Kitchen docusate sodium (COLACE) 100 MG capsule Take 1 capsule (100 mg total) by mouth 2 (two) times daily. 10 capsule 0  . gabapentin (NEURONTIN) 100 MG capsule TAKE 1 OR 2 CAPSULES AT BEDTIME 180 capsule 4  . gabapentin (NEURONTIN) 100 MG capsule Take 2  capsules (200 mg total) by mouth 2 (two) times daily. 30 capsule 1  . methocarbamol (ROBAXIN) 500 MG tablet Take 1 tablet (500 mg total) by mouth every 8 (eight) hours as needed for muscle spasms. 30 tablet 1  . Oxcarbazepine (TRILEPTAL) 300 MG tablet Take 1 and 1/2 tablet in the morning and 2 tablets in the evening. 360 tablet 4  . oxyCODONE (OXY IR/ROXICODONE) 5 MG immediate release tablet Take 1-2 tablets (5-10 mg total) by mouth every 6 (six) hours as needed for moderate pain. 15 tablet 0  . UNABLE TO FIND 500 mg. Med Name: Tumeric Curcumin    . UNABLE TO FIND Med Name: LivCo     No current facility-administered medications for this visit.     PHYSICAL EXAMINATION: ECOG PERFORMANCE STATUS: 1 - Symptomatic but completely ambulatory  No vitals taken today, Exam not performed today   LABORATORY DATA:  I have reviewed the data as listed CBC Latest Ref Rng & Units 05/21/2019 02/17/2019 04/10/2017  WBC 4.0 - 10.5 K/uL 6.0 5.7 7.8  Hemoglobin 12.0 - 15.0 g/dL 12.4 13.4 13.7  Hematocrit 36.0 - 46.0 % 39.1 40.0 41.2  Platelets 150 - 400 K/uL 258 280 479(H)     CMP Latest Ref Rng & Units 05/21/2019 02/17/2019 04/10/2017  Glucose 70 - 99 mg/dL 88 77 111(H)  BUN 8 - 23  mg/dL '13 17 18  '$ Creatinine 0.44 - 1.00 mg/dL 0.88 0.88 0.93  Sodium 135 - 145 mmol/L 141 144 143  Potassium 3.5 - 5.1 mmol/L 3.9 4.3 4.7  Chloride 98 - 111 mmol/L 106 102 98  CO2 22 - 32 mmol/L '26 26 26  '$ Calcium 8.9 - 10.3 mg/dL 9.1 10.2 10.2  Total Protein 6.5 - 8.1 g/dL 7.4 7.2 8.0  Total Bilirubin 0.3 - 1.2 mg/dL 0.2(L) <0.2 0.2  Alkaline Phos 38 - 126 U/L 57 61 64  AST 15 - 41 U/L 12(L) 14 17  ALT 0 - 44 U/L '9 8 12      '$ RADIOGRAPHIC STUDIES: I have personally reviewed the radiological images as listed and agreed with the findings in the report. No results found.   ASSESSMENT & PLAN:  Sally Nguyen is a 63 y.o. female with    1. Ductal carcinoma in situ (DCIS) of left breast, Stage 0, pTisN0M0, ER-/PR-, High  Grade -She was recently diagnosed in 05/2019. Her Breast MRI showed another left breast mass anterior to the known mass.  -Her second left breast biopsy showed this other mass to be more DCIS, ER/PR negative.  -Given the extent of the cancer in her left breast she underwent left Mastectomy on 06/19/19 with Dr. Barry Dienes. We discussed her pathology which showed complete surgical resection and negative LNs. -Given her high grade DCIS she has elevated risk of developing right breast cancer in the future. Any form of adjuvant therapy is preventive. -With mastectomy, radiation is not recommended. Given her ER/PR negative markers she would not benefit from antiestrogen therapy.  -We discussed the breast cancer surveillance after her surgery. She will continue annual screening mammogram, self exam, and a routine office visit with lab and exam with [place]. -Given her high risk for breast cancer and density C breast tissue, I recommend she consider additional screening with breast MRI yearly or every 2 years based on the cost. She is interested.  -Will proceed with breast MRI in 11/2019 and Mammogram in 04/2020.  -F/u with me or Dr. Barry Dienes next year   2. Left breast and arm numbness and Lymphedema -Secondary to surgery  -She started PT 2 weeks ago. She is seeing some improvement.  -I discussed her nerve damage and breast sensitivity will improve but may not completely resolve.    3. Seizures/Epilepsy -She is on Trileptal and Gabapentin  -She will continue to follow up with neurologist.    Plan -Breast MRI in 11/2019 and Mammogram in 04/2019  -F/u with Dr. Barry Dienes or me next year   No problem-specific Assessment & Plan notes found for this encounter.   Orders Placed This Encounter  Procedures  . MR BREAST RIGHT W WO CONTRAST INC CAD    Standing Status:   Future    Standing Expiration Date:   09/19/2020    Order Specific Question:   If indicated for the ordered procedure, I authorize the  administration of contrast media per Radiology protocol    Answer:   Yes    Order Specific Question:   What is the patient's sedation requirement?    Answer:   No Sedation    Order Specific Question:   Does the patient have a pacemaker or implanted devices?    Answer:   No    Order Specific Question:   Radiology Contrast Protocol - do NOT remove file path    Answer:   [email or web address]    Order Specific Question:   Preferred imaging  location?    Answer:   [facility] (table limit-550lbs)  . MM DIAG BREAST TOMO UNI RIGHT    Standing Status:   Future    Standing Expiration Date:   07/20/2020    Order Specific Question:   Reason for Exam (SYMPTOM  OR DIAGNOSIS REQUIRED)    Answer:   screening    Order Specific Question:   Preferred imaging location?    Answer:   [facility]   I discussed the assessment and treatment plan with the patient. The patient was provided an opportunity to ask questions and all were answered. The patient agreed with the plan and demonstrated an understanding of the instructions.  The patient was advised to call back or seek an in-person evaluation if the symptoms worsen or if the condition fails to improve as anticipated.  I provided 15 minutes of face-to-face video visit time during this encounter, and > 50% was spent counseling as documented under my assessment & plan.    Truitt Merle, MD 07/21/2019   I, Joslyn Devon, am acting as scribe for Truitt Merle, MD.   I have reviewed the above documentation for accuracy and completeness, and I agree with the above.

## 2019-07-18 NOTE — Telephone Encounter (Signed)
Confirmed appt and verified info. °

## 2019-07-21 ENCOUNTER — Telehealth: Payer: Self-pay | Admitting: Hematology

## 2019-07-21 ENCOUNTER — Encounter: Payer: Self-pay | Admitting: Hematology

## 2019-07-21 ENCOUNTER — Encounter

## 2019-07-21 ENCOUNTER — Ambulatory Visit: Admitting: Rehabilitation

## 2019-07-21 ENCOUNTER — Inpatient Hospital Stay: Attending: Hematology | Admitting: Hematology

## 2019-07-21 DIAGNOSIS — D0512 Intraductal carcinoma in situ of left breast: Secondary | ICD-10-CM | POA: Diagnosis not present

## 2019-07-21 DIAGNOSIS — Z1231 Encounter for screening mammogram for malignant neoplasm of breast: Secondary | ICD-10-CM

## 2019-07-21 NOTE — Telephone Encounter (Signed)
Called patient regarding upcoming Webex appointment, test run complete and e-mail has been sent. °

## 2019-07-22 ENCOUNTER — Telehealth: Payer: Self-pay | Admitting: *Deleted

## 2019-07-22 ENCOUNTER — Encounter: Payer: Self-pay | Admitting: *Deleted

## 2019-07-22 ENCOUNTER — Telehealth: Payer: Self-pay | Admitting: Nurse Practitioner

## 2019-07-22 DIAGNOSIS — Z1231 Encounter for screening mammogram for malignant neoplasm of breast: Secondary | ICD-10-CM

## 2019-07-22 NOTE — Telephone Encounter (Signed)
Scheduled appt per 8/18 sch message- no answer . Unable to leave message - mailed letter with appt date and time

## 2019-07-22 NOTE — Telephone Encounter (Signed)
[facility] called to say they do right and left breast MRI. Order needs to be changed.

## 2019-07-23 ENCOUNTER — Telehealth: Payer: Self-pay | Admitting: Hematology

## 2019-07-23 ENCOUNTER — Other Ambulatory Visit: Payer: Self-pay

## 2019-07-23 ENCOUNTER — Encounter: Payer: Self-pay | Admitting: Rehabilitation

## 2019-07-23 ENCOUNTER — Ambulatory Visit: Admitting: Rehabilitation

## 2019-07-23 DIAGNOSIS — R293 Abnormal posture: Secondary | ICD-10-CM

## 2019-07-23 DIAGNOSIS — M25612 Stiffness of left shoulder, not elsewhere classified: Secondary | ICD-10-CM

## 2019-07-23 DIAGNOSIS — Z483 Aftercare following surgery for neoplasm: Secondary | ICD-10-CM

## 2019-07-23 NOTE — Telephone Encounter (Signed)
Per 8/17 los F/u open

## 2019-07-23 NOTE — Patient Instructions (Signed)
Begin scar massage with vitamin E or similar

## 2019-07-23 NOTE — Therapy (Signed)
[place], [place], [address] Phone: [phone]   Fax:  [phone]  Physical Therapy Treatment  Patient Details  Name: Sally Nguyen MRN: [id number] Date of Birth: 05-30-1956 Referring Provider (PT): Dr. Barry Dienes   Encounter Date: 07/23/2019  PT End of Session - 07/23/19 1606    Visit Number  2    Number of Visits  9    Date for PT Re-Evaluation  08/22/19    Authorization Type  tricare    PT Start Time  1500    PT Stop Time  1547    PT Time Calculation (min)  47 min    Activity Tolerance  Patient tolerated treatment well    Behavior During Therapy  [facility] for tasks assessed/performed       Past Medical History:  Diagnosis Date  . Cancer ([place]) 05/2019   left breast DCIS  . Seizures ([place])   . Vision abnormalities     Past Surgical History:  Procedure Laterality Date  . COLONOSCOPY    . MASTECTOMY W/ SENTINEL NODE BIOPSY Left 06/19/2019   Procedure: LEFT MASTECTOMY WITH SENTINEL LYMPH NODE BIOPSY;  Surgeon: Stark Klein, MD;  Location: [place];  Service: General;  Laterality: Left;  . WISDOM TOOTH EXTRACTION      There were no vitals filed for this visit.  Subjective Assessment - 07/23/19 1502    Subjective  I can wash my hair with both hands now! I am getting better    Pertinent History  Lt mastectomy and SLNB on 06/19/19 by Dr. Barry Dienes with 2 nodes removed. ER/PR negative HER2 positive.  History of seizures. No other treatments needed    Currently in Pain?  No/denies         [facility] PT Assessment - 07/23/19 0001      AROM   Left Shoulder Extension  62 Degrees    Left Shoulder Flexion  122 Degrees    Left Shoulder ABduction  115 Degrees   more of a scaption                   [facility] Adult PT Treatment/Exercise - 07/23/19 0001      Exercises   Exercises  Shoulder      Shoulder Exercises: Standing   Row  15 reps    Theraband Level (Shoulder Row)   Level 1 (Yellow)      Shoulder Exercises: Pulleys   Flexion  2 minutes    Flexion Limitations  with demonstration    ABduction  2 minutes    ABduction Limitations  with demostration    Other Pulley Exercises  gave pt ordering info for pulleys       Shoulder Exercises: Therapy Ball   Flexion  10 reps    Flexion Limitations  with demo      Shoulder Exercises: ROM/Strengthening   Other ROM/Strengthening Exercises  dowel flexion and abduction x 10     Other ROM/Strengthening Exercises  alternating flexion back against the wall x 10      Manual Therapy   Manual Therapy  Passive ROM;Soft tissue mobilization    Manual therapy comments  education and performance of scar massage using thick lotion up to the lateral edge where a bit of scab is still present    Soft tissue mobilization  to the Lt latissimus and pectoralis in supine with PROM    Passive ROM  to the Lt shoulder to tolerance some GH adjustments needed  for popping/clicking                  PT Long Term Goals - 07/11/19 1159      PT LONG TERM GOAL #1   Title  Pt will improve lt shoulder abduction to 155 or greater    Baseline  92    Time  4    Period  Weeks    Status  New      PT LONG TERM GOAL #2   Title  Pt will improve Lt horizontal abduction to 30deg or greater    Baseline  -30    Time  4    Period  Weeks    Status  New      PT LONG TERM GOAL #3   Title  Pt will return to all daily activities without limitations - including irish drumming    Time  4    Period  Weeks    Status  New      PT LONG TERM GOAL #4   Title  Pt will be knowledgeable about lmyphedema and risk reduction    Time  4    Period  Weeks    Status  Achieved            Plan - 07/23/19 1606    Clinical Impression Statement  Pt returns after eval with less pectoralis spasm and improving UE use.  No significant change in shoulder flexion but improvements in abduction/scaption.  started scar massage, PROM, and STM today reporting  it feels better upon leaving. Pt remarked that she was doing all the exercsies from PT as well as the ones in her breast cancer book, so we discussed not having to do all of these every day.    Examination-Activity Limitations  Lift;Caring for Others;Carry    PT Treatment/Interventions  ADLs/Self Care Home Management;Manual lymph drainage;Manual techniques;Therapeutic exercise;Therapeutic activities;Patient/family education    PT Next Visit Plan  Lt shoulder PROM STM, scar mobilization    PT Home Exercise Plan  Access Code: [email or web address]    Consulted and Agree with Plan of Care  Patient       Patient will benefit from skilled therapeutic intervention in order to improve the following deficits and impairments:     Visit Diagnosis: 1. Aftercare following surgery for neoplasm   2. Stiffness of left shoulder, not elsewhere classified   3. Abnormal posture        Problem List Patient Active Problem List   Diagnosis Date Noted  . Breast cancer, stage 0, left 06/19/2019  . Ductal carcinoma in situ (DCIS) of left breast 05/15/2019  . Insomnia 05/09/2018  . 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 Partial symptomatic epilepsy with complex partial seizures, not intractable, without status epilepticus ([place]) 04/10/2017    Stark Bray 07/23/2019, 4:09 PM  [place], [place], [address] Phone: [phone]   Fax:  [phone]  Name: Sally Nguyen MRN: [id number] Date of Birth: 1956/05/03

## 2019-07-25 ENCOUNTER — Encounter: Payer: Self-pay | Admitting: Physical Therapy

## 2019-07-25 ENCOUNTER — Other Ambulatory Visit: Payer: Self-pay

## 2019-07-25 ENCOUNTER — Ambulatory Visit: Admitting: Physical Therapy

## 2019-07-25 DIAGNOSIS — Z483 Aftercare following surgery for neoplasm: Secondary | ICD-10-CM | POA: Diagnosis not present

## 2019-07-25 DIAGNOSIS — M25612 Stiffness of left shoulder, not elsewhere classified: Secondary | ICD-10-CM

## 2019-07-25 NOTE — Therapy (Signed)
[place], [place], [address] Phone: [phone]   Fax:  [phone]  Physical Therapy Treatment  Patient Details  Name: Sally Nguyen MRN: [id number] Date of Birth: 08-31-1956 Referring Provider (PT): Dr. Barry Dienes   Encounter Date: 07/25/2019  PT End of Session - 07/25/19 1120    Visit Number  3    Number of Visits  9    Date for PT Re-Evaluation  08/22/19    Authorization Type  tricare    PT Start Time  1034    PT Stop Time  1117    PT Time Calculation (min)  43 min    Activity Tolerance  Patient tolerated treatment well    Behavior During Therapy  [facility] for tasks assessed/performed       Past Medical History:  Diagnosis Date  . Cancer ([place]) 05/2019   left breast DCIS  . Seizures ([place])   . Vision abnormalities     Past Surgical History:  Procedure Laterality Date  . COLONOSCOPY    . MASTECTOMY W/ SENTINEL NODE BIOPSY Left 06/19/2019   Procedure: LEFT MASTECTOMY WITH SENTINEL LYMPH NODE BIOPSY;  Surgeon: Stark Klein, MD;  Location: [place];  Service: General;  Laterality: Left;  . WISDOM TOOTH EXTRACTION      There were no vitals filed for this visit.  Subjective Assessment - 07/25/19 1036    Subjective  My shoulder was really sore yesterday. The exercises did not update in my app.    Pertinent History  Lt mastectomy and SLNB on 06/19/19 by Dr. Barry Dienes with 2 nodes removed. ER/PR negative HER2 positive.  History of seizures. No other treatments needed    Patient Stated Goals  get back to normal    Currently in Pain?  No/denies                       [facility] Adult PT Treatment/Exercise - 07/25/19 0001      Shoulder Exercises: Standing   Row  15 reps    Theraband Level (Shoulder Row)  Level 1 (Yellow)    Other Standing Exercises  alternating flexion against all trying to touch thumb to wall x 10 reps    Other Standing Exercises  doorway stretch x 5 reps  with 20 sec holds      Shoulder Exercises: Pulleys   Flexion  2 minutes    ABduction  2 minutes      Shoulder Exercises: Therapy Ball   Flexion  10 reps   with stretch at end range of motion     Manual Therapy   Soft tissue mobilization  to left pec muscle epecially near axilla where she has increased tightness using biotone and gently along part of mastectomy scar that is healed    Passive ROM  to left shoulder with prolonged holds into abduction and ER                  PT Long Term Goals - 07/11/19 1159      PT LONG TERM GOAL #1   Title  Pt will improve lt shoulder abduction to 155 or greater    Baseline  92    Time  4    Period  Weeks    Status  New      PT LONG TERM GOAL #2   Title  Pt will improve Lt horizontal abduction to 30deg or greater    Baseline  -30  Time  4    Period  Weeks    Status  New      PT LONG TERM GOAL #3   Title  Pt will return to all daily activities without limitations - including irish drumming    Time  4    Period  Weeks    Status  New      PT LONG TERM GOAL #4   Title  Pt will be knowledgeable about lmyphedema and risk reduction    Time  4    Period  Weeks    Status  Achieved            Plan - 07/25/19 1120    Clinical Impression Statement  Pt is continuing to have tightness across her pec muscle and did have some muscle soreness after last session. Updated pt's HEP on medbridge to reflect exercises issued at last session. Continued with AAROM exericses, PROM and soft tissue mobilization to left pec.    Rehab Potential  Excellent    PT Frequency  2x / week    PT Duration  4 weeks    PT Treatment/Interventions  ADLs/Self Care Home Management;Manual lymph drainage;Manual techniques;Therapeutic exercise;Therapeutic activities;Patient/family education    PT Next Visit Plan  Lt shoulder PROM STM, scar mobilization    PT Home Exercise Plan  Access Code: [email or web address]    Consulted and Agree with Plan of Care  Patient        Patient will benefit from skilled therapeutic intervention in order to improve the following deficits and impairments:  Decreased skin integrity, Increased fascial restricitons, Decreased scar mobility, Pain, Decreased range of motion, Impaired UE functional use  Visit Diagnosis: Aftercare following surgery for neoplasm  Stiffness of left shoulder, not elsewhere classified     Problem List Patient Active Problem List   Diagnosis Date Noted  . Breast cancer, stage 0, left 06/19/2019  . Ductal carcinoma in situ (DCIS) of left breast 05/15/2019  . Insomnia 05/09/2018  . 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 Partial symptomatic epilepsy with complex partial seizures, not intractable, without status epilepticus ([place]) 04/10/2017    Allyson Sabal [facility] 07/25/2019, [place], [place], [address] Phone: [phone]   Fax:  [phone]  Name: Sally Nguyen MRN: [id number] Date of Birth: 07-Feb-1956  Manus Gunning, PT 07/25/19 11:22 AM

## 2019-07-30 ENCOUNTER — Encounter: Payer: Self-pay | Admitting: Rehabilitation

## 2019-07-30 ENCOUNTER — Ambulatory Visit: Admitting: Rehabilitation

## 2019-07-30 ENCOUNTER — Other Ambulatory Visit: Payer: Self-pay

## 2019-07-30 DIAGNOSIS — Z483 Aftercare following surgery for neoplasm: Secondary | ICD-10-CM

## 2019-07-30 DIAGNOSIS — M25612 Stiffness of left shoulder, not elsewhere classified: Secondary | ICD-10-CM

## 2019-07-30 DIAGNOSIS — R293 Abnormal posture: Secondary | ICD-10-CM

## 2019-07-30 NOTE — Therapy (Signed)
[place], [place], [address] Phone: [phone]   Fax:  [phone]  Physical Therapy Treatment  Patient Details  Name: Sally Nguyen MRN: [id number] Date of Birth: Apr 18, 1956 Referring Provider (PT): Dr. Barry Dienes   Encounter Date: 07/30/2019  PT End of Session - 07/30/19 1659    Visit Number  4    Number of Visits  9    Date for PT Re-Evaluation  08/22/19    PT Start Time  1600    PT Stop Time  1654    PT Time Calculation (min)  54 min    Activity Tolerance  Patient tolerated treatment well    Behavior During Therapy  [facility] for tasks assessed/performed       Past Medical History:  Diagnosis Date  . Cancer ([place]) 05/2019   left breast DCIS  . Seizures ([place])   . Vision abnormalities     Past Surgical History:  Procedure Laterality Date  . COLONOSCOPY    . MASTECTOMY W/ SENTINEL NODE BIOPSY Left 06/19/2019   Procedure: LEFT MASTECTOMY WITH SENTINEL LYMPH NODE BIOPSY;  Surgeon: Stark Klein, MD;  Location: [place];  Service: General;  Laterality: Left;  . WISDOM TOOTH EXTRACTION      There were no vitals filed for this visit.  Subjective Assessment - 07/30/19 1558    Subjective  It is getting better.  I got my home pulleys    Pertinent History  Lt mastectomy and SLNB on 06/19/19 by Dr. Barry Dienes with 2 nodes removed. ER/PR negative HER2 positive.  History of seizures. No other treatments needed    Patient Stated Goals  get back to normal    Currently in Pain?  No/denies         [facility] PT Assessment - 07/30/19 0001      ROM / Strength   AROM / PROM / Strength  Strength      AROM   Left Shoulder Flexion  132 Degrees    Left Shoulder ABduction  100 Degrees   pure abduction not scaption today   Left Shoulder Internal Rotation  70 Degrees    Left Shoulder External Rotation  87 Degrees      Strength   Overall Strength Comments  overall 4+/5 slightly weaker ER and  abduction                   [facility] Adult PT Treatment/Exercise - 07/30/19 0001      Shoulder Exercises: Standing   Protraction Limitations  pro/ret arms on the wall     External Rotation  Both;10 reps;Theraband    Theraband Level (Shoulder External Rotation)  Level 1 (Yellow)    Extension  Both;10 reps    Theraband Level (Shoulder Extension)  Level 1 (Yellow)    Row  20 reps    Theraband Level (Shoulder Row)  Level 1 (Yellow)    Row Limitations  2 sets      Shoulder Exercises: Pulleys   Flexion  2 minutes    ABduction  2 minutes    Other Pulley Exercises  IR stretch easy      Shoulder Exercises: Therapy Ball   Flexion  10 reps    ABduction  Left;10 reps    ABduction Limitations  no cording evident      Manual Therapy   Manual therapy comments  general incision mobility and work at drain site due to pulling here with shoulder elevation  Soft tissue mobilization  to left pec muscle epecially near axilla where she has increased tightness using biotone and gently along part of mastectomy scar that is healed    Passive ROM  to the left shoulder with less end range today to attempt to decrease soreness.  D2 and ER holds included                  PT Long Term Goals - 07/30/19 1705      PT LONG TERM GOAL #1   Title  Pt will improve lt shoulder abduction to 155 or greater    Baseline  92; 07/30/19:132    Status  Partially Met      PT LONG TERM GOAL #2   Title  Pt will improve Lt horizontal abduction to 30deg or greater    Baseline  -30, 8/26: 5deg    Status  Partially Met      PT LONG TERM GOAL #3   Title  Pt will return to all daily activities without limitations - including irish drumming    Baseline  has not tried drumming yet    Status  Partially Met      PT LONG TERM GOAL #4   Title  Pt will be knowledgeable about lmyphedema and risk reduction    Status  Achieved            Plan - 07/30/19 1700    Clinical Impression Statement  Decreased  STM intensity with more scar and myofasical release due to soreness after last 2 visits.  Pt continues to improve flexion and despite decreased abduction ROM but less scaption today and more true abduction.  Had pt schedule 2x per week for 3 more weeks.    PT Treatment/Interventions  ADLs/Self Care Home Management;Manual lymph drainage;Manual techniques;Therapeutic exercise;Therapeutic activities;Patient/family education    PT Next Visit Plan  Lt shoulder PROM STM, scar mobilization, continue Lt shoulder strength and mobility working towards strength ABC       Patient will benefit from skilled therapeutic intervention in order to improve the following deficits and impairments:     Visit Diagnosis: Aftercare following surgery for neoplasm  Stiffness of left shoulder, not elsewhere classified  Abnormal posture     Problem List Patient Active Problem List   Diagnosis Date Noted  . Breast cancer, stage 0, left 06/19/2019  . Ductal carcinoma in situ (DCIS) of left breast 05/15/2019  . Insomnia 05/09/2018  . 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 Partial symptomatic epilepsy with complex partial seizures, not intractable, without status epilepticus ([place]) 04/10/2017    Stark Bray 07/30/2019, 5:06 PM  [place], [place], [address] Phone: [phone]   Fax:  [phone]  Name: Sally Nguyen MRN: [id number] Date of Birth: Jul 02, 1956

## 2019-08-01 ENCOUNTER — Ambulatory Visit: Admitting: Rehabilitation

## 2019-08-01 ENCOUNTER — Encounter: Payer: Self-pay | Admitting: Rehabilitation

## 2019-08-01 ENCOUNTER — Other Ambulatory Visit: Payer: Self-pay

## 2019-08-01 DIAGNOSIS — M25612 Stiffness of left shoulder, not elsewhere classified: Secondary | ICD-10-CM

## 2019-08-01 DIAGNOSIS — R293 Abnormal posture: Secondary | ICD-10-CM

## 2019-08-01 DIAGNOSIS — Z483 Aftercare following surgery for neoplasm: Secondary | ICD-10-CM

## 2019-08-01 NOTE — Therapy (Signed)
[place], [place], [address] Phone: [phone]   Fax:  [phone]  Physical Therapy Treatment  Patient Details  Name: Sally Nguyen MRN: [id number] Date of Birth: 1956-08-14 Referring Provider (PT): Dr. Barry Dienes   Encounter Date: 08/01/2019  PT End of Session - 08/01/19 1151    Visit Number  5    Number of Visits  9    Date for PT Re-Evaluation  08/22/19    PT Start Time  1102    PT Stop Time  1150    PT Time Calculation (min)  48 min    Activity Tolerance  Patient tolerated treatment well    Behavior During Therapy  [facility] for tasks assessed/performed       Past Medical History:  Diagnosis Date  . Cancer ([place]) 05/2019   left breast DCIS  . Seizures ([place])   . Vision abnormalities     Past Surgical History:  Procedure Laterality Date  . COLONOSCOPY    . MASTECTOMY W/ SENTINEL NODE BIOPSY Left 06/19/2019   Procedure: LEFT MASTECTOMY WITH SENTINEL LYMPH NODE BIOPSY;  Surgeon: Sally Klein, MD;  Location: [place];  Service: General;  Laterality: Left;  . WISDOM TOOTH EXTRACTION      There were no vitals filed for this visit.  Subjective Assessment - 08/01/19 1106    Subjective  I am still good    Pertinent History  Lt mastectomy and SLNB on 06/19/19 by Dr. Barry Dienes with 2 nodes removed. ER/PR negative HER2 positive.  History of seizures. No other treatments needed    Currently in Pain?  No/denies                       [facility] Adult PT Treatment/Exercise - 08/01/19 0001      Shoulder Exercises: Standing   Protraction Limitations  pro/ret arms on the wall     External Rotation  Both;20 reps    Theraband Level (Shoulder External Rotation)  Level 1 (Yellow)    Row  20 reps    Theraband Level (Shoulder Row)  Level 1 (Yellow)    Row Limitations  2 sets      Shoulder Exercises: Pulleys   Flexion  2 minutes    ABduction  2 minutes      Shoulder Exercises:  Therapy Ball   Flexion  10 reps    ABduction  Left;10 reps      Shoulder Exercises: ROM/Strengthening   Other ROM/Strengthening Exercises  port-de-bras on the wall x 10, open book on the wall x 10      Manual Therapy   Manual therapy comments  general incision mobility and work at drain site due to pulling here with shoulder elevation    Soft tissue mobilization  to left pec muscle epecially near axilla where she has increased tightness using biotone and gently along part of mastectomy scar that is healed    Passive ROM  to the left shoulder with less end range today to attempt to decrease soreness.  D2 and ER holds included                  PT Long Term Goals - 07/30/19 1705      PT LONG TERM GOAL #1   Title  Pt will improve lt shoulder abduction to 155 or greater    Baseline  92; 07/30/19:132    Status  Partially Met      PT  LONG TERM GOAL #2   Title  Pt will improve Lt horizontal abduction to 30deg or greater    Baseline  -30, 8/26: 5deg    Status  Partially Met      PT LONG TERM GOAL #3   Title  Pt will return to all daily activities without limitations - including irish drumming    Baseline  has not tried drumming yet    Status  Partially Met      PT LONG TERM GOAL #4   Title  Pt will be knowledgeable about lmyphedema and risk reduction    Status  Achieved            Plan - 08/01/19 1152    Clinical Impression Statement  continues with pectoralis region tightness limiting abduction and flexion but improving overall.  Scar mobility doing well.    PT Frequency  2x / week    PT Duration  4 weeks    PT Treatment/Interventions  ADLs/Self Care Home Management;Manual lymph drainage;Manual techniques;Therapeutic exercise;Therapeutic activities;Patient/family education    PT Next Visit Plan  Lt shoulder PROM STM, scar mobilization, continue Lt shoulder strength and mobility working towards strength ABC    PT Home Exercise Plan  Access Code: [email or web address]    Consulted  and Agree with Plan of Care  Patient       Patient will benefit from skilled therapeutic intervention in order to improve the following deficits and impairments:     Visit Diagnosis: Aftercare following surgery for neoplasm  Stiffness of left shoulder, not elsewhere classified  Abnormal posture     Problem List Patient Active Problem List   Diagnosis Date Noted  . Breast cancer, stage 0, left 06/19/2019  . Ductal carcinoma in situ (DCIS) of left breast 05/15/2019  . Insomnia 05/09/2018  . 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 Partial symptomatic epilepsy with complex partial seizures, not intractable, without status epilepticus ([place]) 04/10/2017    Sally Nguyen 08/01/2019, 11:53 AM  [place], [place], [address] Phone: [phone]   Fax:  [phone]  Name: Sally Nguyen MRN: [id number] Date of Birth: 12-Oct-1956

## 2019-08-06 ENCOUNTER — Ambulatory Visit: Attending: General Surgery | Admitting: Rehabilitation

## 2019-08-06 ENCOUNTER — Other Ambulatory Visit: Payer: Self-pay

## 2019-08-06 ENCOUNTER — Encounter: Payer: Self-pay | Admitting: Rehabilitation

## 2019-08-06 DIAGNOSIS — Z483 Aftercare following surgery for neoplasm: Secondary | ICD-10-CM | POA: Diagnosis present

## 2019-08-06 DIAGNOSIS — R293 Abnormal posture: Secondary | ICD-10-CM

## 2019-08-06 DIAGNOSIS — M25612 Stiffness of left shoulder, not elsewhere classified: Secondary | ICD-10-CM | POA: Diagnosis present

## 2019-08-06 NOTE — Therapy (Signed)
[place], [place], [address] Phone: [phone]   Fax:  [phone]  Physical Therapy Treatment  Patient Details  Name: Sally Nguyen MRN: [id number] Date of Birth: [birth year] Referring Provider (PT): Dr. Barry Dienes   Encounter Date: 08/06/2019  PT End of Session - 08/06/19 1707    Visit Number  6    Number of Visits  9    Date for PT Re-Evaluation  08/22/19    PT Start Time  1500    PT Stop Time  1550    PT Time Calculation (min)  50 min    Activity Tolerance  Patient tolerated treatment well    Behavior During Therapy  [facility] for tasks assessed/performed       Past Medical History:  Diagnosis Date  . Cancer ([place]) 05/2019   left breast DCIS  . Seizures ([place])   . Vision abnormalities     Past Surgical History:  Procedure Laterality Date  . COLONOSCOPY    . MASTECTOMY W/ SENTINEL NODE BIOPSY Left 06/19/2019   Procedure: LEFT MASTECTOMY WITH SENTINEL LYMPH NODE BIOPSY;  Surgeon: Stark Klein, MD;  Location: [place];  Service: General;  Laterality: Left;  . WISDOM TOOTH EXTRACTION      There were no vitals filed for this visit.  Subjective Assessment - 08/06/19 1504    Subjective  I think I hit a plateau but I'm still doing it. I am maybe a little puffy on the left side after driving in the car x 4.5 hours.    Pertinent History  Lt mastectomy and SLNB on 06/19/19 by Dr. Barry Dienes with 2 nodes removed. ER/PR negative HER2 positive.  History of seizures. No other treatments needed    Currently in Pain?  No/denies         [facility] PT Assessment - 08/06/19 0001      AROM   Left Shoulder Flexion  144 Degrees    Left Shoulder ABduction  127 Degrees                   [facility] Adult PT Treatment/Exercise - 08/06/19 0001      Shoulder Exercises: Sidelying   Other Sidelying Exercises  open door with PT hip blocking 5x6" Lt      Shoulder Exercises: Pulleys   Flexion  2  minutes    ABduction  2 minutes      Shoulder Exercises: Therapy Ball   Flexion  10 reps    ABduction  Left;10 reps      Shoulder Exercises: Stretch   Other Shoulder Stretches  childs pose on chair stretch 3x20"      Manual Therapy   Manual Therapy  Manual Lymphatic Drainage (MLD)    Manual Lymphatic Drainage (MLD)  educated pt on MLD techiniques for increased puffiness at the left lateral chest performing breathing, neck circles, Lt inguinal and Lt axillary circles and working edema towards both in supine or seated.  pt with good understanding.  performed by PT as well                  PT Long Term Goals - 07/30/19 1705      PT LONG TERM GOAL #1   Title  Pt will improve lt shoulder abduction to 155 or greater    Baseline  92; 07/30/19:132    Status  Partially Met      PT LONG TERM GOAL #2   Title  Pt will  improve Lt horizontal abduction to 30deg or greater    Baseline  -30, 8/26: 5deg    Status  Partially Met      PT LONG TERM GOAL #3   Title  Pt will return to all daily activities without limitations - including irish drumming    Baseline  has not tried drumming yet    Status  Partially Met      PT LONG TERM GOAL #4   Title  Pt will be knowledgeable about lmyphedema and risk reduction    Status  Achieved            Plan - 08/06/19 1704    Clinical Impression Statement  Pt getting frustrated with "plateau" but pt is still making significant gains in ROM measurements so pt was encouraged that she is still doing well.  After driving x 4.5 hours using the Lt UE and discovering that pt was wearing a tight compression tank top most likely cutting off the swelling at the axilla pt has some increased edema and puffiness at the lateral chest.  educated on self MLD and peformed in clinic today.    PT Frequency  2x / week    PT Duration  4 weeks    PT Treatment/Interventions  ADLs/Self Care Home Management;Manual lymph drainage;Manual techniques;Therapeutic  exercise;Therapeutic activities;Patient/family education    PT Next Visit Plan  how was MLD? need any more PT MLD sessions? any foam? Lt shoulder PROM STM, pectoralis SMT, continue Lt shoulder strength and mobility working towards strength ABC    PT Home Exercise Plan  Access Code: [email or web address]    Consulted and Agree with Plan of Care  Patient       Patient will benefit from skilled therapeutic intervention in order to improve the following deficits and impairments:  Decreased skin integrity, Increased fascial restricitons, Decreased scar mobility, Pain, Decreased range of motion, Impaired UE functional use  Visit Diagnosis: Aftercare following surgery for neoplasm  Stiffness of left shoulder, not elsewhere classified  Abnormal posture     Problem List Patient Active Problem List   Diagnosis Date Noted  . Breast cancer, stage 0, left 06/19/2019  . Ductal carcinoma in situ (DCIS) of left breast 05/15/2019  . Insomnia 05/09/2018  . 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 Partial symptomatic epilepsy with complex partial seizures, not intractable, without status epilepticus ([place]) 04/10/2017    Stark Bray 08/06/2019, 5:08 PM  [place], [place], [address] Phone: [phone]   Fax:  [phone]  Name: Sally Nguyen MRN: [id number] Date of Birth: 1956-04-20

## 2019-08-06 NOTE — Patient Instructions (Signed)
Self manual lymph drainage: Perform this sequence once a day.  Only give enough pressure no your skin to make the skin move.  Diaphragmatic - Supine   1.) Inhale through nose making navel move out toward hands. Exhale through puckered lips, hands follow navel in. Repeat _5__ times. Rest _10__ seconds between repeats.   Copyright  VHI. All rights reserved.  2.) Hug yourself.  Do circles at your neck just above your collarbones.  Repeat this 10 times.  3.) Axilla - Circles in the left armpit   Using full weight of flat hand and fingers at center of the left armpit, make _10__ in-place circles.   Copyright  VHI. All rights reserved.  LEG: Inguinal Nodes Stimulation   4.) With small finger side of hand against hip crease on left side, gently perform circles at the crease. Repeat __10_ times.   Copyright  VHI. All rights reserved.  5.) Axilla to Inguinal Nodes - Sweep        * to use the right hand   On involved side, sweep _10__ times from armpit along side of trunk to hip crease.  Work for a few minutes on the swollen area directing it towards the left groin or left armpit.   Repeat the steps above where you do circles in your left groin and left  Copyright  VHI. All rights reserved.

## 2019-08-08 ENCOUNTER — Ambulatory Visit: Admitting: Rehabilitation

## 2019-08-08 ENCOUNTER — Encounter: Payer: Self-pay | Admitting: Rehabilitation

## 2019-08-08 ENCOUNTER — Other Ambulatory Visit: Payer: Self-pay

## 2019-08-08 DIAGNOSIS — Z483 Aftercare following surgery for neoplasm: Secondary | ICD-10-CM | POA: Diagnosis not present

## 2019-08-08 DIAGNOSIS — M25612 Stiffness of left shoulder, not elsewhere classified: Secondary | ICD-10-CM

## 2019-08-08 DIAGNOSIS — R293 Abnormal posture: Secondary | ICD-10-CM

## 2019-08-08 NOTE — Therapy (Signed)
[place], [place], [address] Phone: [phone]   Fax:  [phone]  Physical Therapy Treatment  Patient Details  Name: Sally Nguyen MRN: [id number] Date of Birth: [birth year] Referring Provider (PT): Dr. Barry Dienes   Encounter Date: 08/08/2019  PT End of Session - 08/08/19 1213    Visit Number  7    Number of Visits  9    Date for PT Re-Evaluation  08/22/19    PT Start Time  1104    PT Stop Time  1153    PT Time Calculation (min)  49 min    Activity Tolerance  Patient tolerated treatment well    Behavior During Therapy  [facility] for tasks assessed/performed       Past Medical History:  Diagnosis Date  . Cancer ([place]) 05/2019   left breast DCIS  . Seizures ([place])   . Vision abnormalities     Past Surgical History:  Procedure Laterality Date  . COLONOSCOPY    . MASTECTOMY W/ SENTINEL NODE BIOPSY Left 06/19/2019   Procedure: LEFT MASTECTOMY WITH SENTINEL LYMPH NODE BIOPSY;  Surgeon: Stark Klein, MD;  Location: [place];  Service: General;  Laterality: Left;  . WISDOM TOOTH EXTRACTION      There were no vitals filed for this visit.  Subjective Assessment - 08/08/19 1108    Subjective  everything just feels so tight  It seems a little bit puffy still.  I felt like I couldn't take a deep breath last night but it is better today.  i just thought it would be better by now.    Pertinent History  Lt mastectomy and SLNB on 06/19/19 by Dr. Barry Dienes with 2 nodes removed. ER/PR negative HER2 positive.  History of seizures. No other treatments needed    Currently in Pain?  No/denies                       [facility] Adult PT Treatment/Exercise - 08/08/19 0001      Manual Therapy   Manual Therapy  Myofascial release    Manual therapy comments  issued small foam rectangle for cami use    Soft tissue mobilization  to the Lt pectoralis complete belly    Myofascial Release  cross hands  at various places along the chest    Manual Lymphatic Drainage (MLD)  performed MLD for the left lateral chest today due to continued mild puffiness here.  short neck, abdominals, Rt axillary nodes interaxillary pathway, Lt axillary nodes and Lt inguinal nodes, Lt axillo inguinal pathway, then focus on left lateral chest and chest wall to also improve skin mobility here, then retraced steps    Passive ROM  to the left shoulder today with less popping and some release to the pectoralis added at end range flexion and ER at 90deg                  PT Long Term Goals - 07/30/19 1705      PT LONG TERM GOAL #1   Title  Pt will improve lt shoulder abduction to 155 or greater    Baseline  92; 07/30/19:132    Status  Partially Met      PT LONG TERM GOAL #2   Title  Pt will improve Lt horizontal abduction to 30deg or greater    Baseline  -30, 8/26: 5deg    Status  Partially Met      PT LONG  TERM GOAL #3   Title  Pt will return to all daily activities without limitations - including irish drumming    Baseline  has not tried drumming yet    Status  Partially Met      PT LONG TERM GOAL #4   Title  Pt will be knowledgeable about lmyphedema and risk reduction    Status  Achieved            Plan - 08/08/19 1213    Clinical Impression Statement  Pt still frustrated by tightness in the chest and felt like it was harder to breathe today.  Focused todays session on MLD and general left chest wall mobility with gentle ROM and myofasical release.  Pt will some puffiness Lateral chest but edema vs skin questionable.  Pt reported feeling much better after this session.  Will focus PT more on manual with pt doing so well with HEP    PT Frequency  2x / week    PT Duration  4 weeks    PT Treatment/Interventions  ADLs/Self Care Home Management;Manual lymph drainage;Manual techniques;Therapeutic exercise;Therapeutic activities;Patient/family education    PT Next Visit Plan  how was foam? left  chest release and MLD focus advance exercises as needed working towards strength ABC    PT Home Exercise Plan  Access Code: [email or web address]    Consulted and Agree with Plan of Care  Patient       Patient will benefit from skilled therapeutic intervention in order to improve the following deficits and impairments:     Visit Diagnosis: Stiffness of left shoulder, not elsewhere classified  Aftercare following surgery for neoplasm  Abnormal posture     Problem List Patient Active Problem List   Diagnosis Date Noted  . Breast cancer, stage 0, left 06/19/2019  . Ductal carcinoma in situ (DCIS) of left breast 05/15/2019  . Insomnia 05/09/2018  . 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 Partial symptomatic epilepsy with complex partial seizures, not intractable, without status epilepticus ([employer]) 04/10/2017    Stark Bray 08/08/2019, 12:16 PM  [place], [place], [address] Phone: [phone]   Fax:  [phone]  Name: Sally Nguyen MRN: [id number] Date of Birth: [birth year]

## 2019-08-13 ENCOUNTER — Encounter: Payer: Self-pay | Admitting: Rehabilitation

## 2019-08-13 ENCOUNTER — Other Ambulatory Visit: Payer: Self-pay

## 2019-08-13 ENCOUNTER — Ambulatory Visit: Admitting: Rehabilitation

## 2019-08-13 DIAGNOSIS — R293 Abnormal posture: Secondary | ICD-10-CM

## 2019-08-13 DIAGNOSIS — Z483 Aftercare following surgery for neoplasm: Secondary | ICD-10-CM | POA: Diagnosis not present

## 2019-08-13 DIAGNOSIS — M25612 Stiffness of left shoulder, not elsewhere classified: Secondary | ICD-10-CM

## 2019-08-13 NOTE — Therapy (Signed)
[place], [place], [address] Phone: [phone]   Fax:  [phone]  Physical Therapy Treatment  Patient Details  Name: Sally Nguyen MRN: [id number] Date of Birth: [birth year] Referring Provider (PT): Dr. Barry Dienes   Encounter Date: 08/13/2019  PT End of Session - 08/13/19 1649    Visit Number  8    Number of Visits  9    Date for PT Re-Evaluation  08/22/19    PT Start Time  1500    PT Stop Time  1550    PT Time Calculation (min)  50 min    Activity Tolerance  Patient tolerated treatment well    Behavior During Therapy  [facility] for tasks assessed/performed       Past Medical History:  Diagnosis Date  . Cancer ([place]) 05/2019   left breast DCIS  . Seizures ([place])   . Vision abnormalities     Past Surgical History:  Procedure Laterality Date  . COLONOSCOPY    . MASTECTOMY W/ SENTINEL NODE BIOPSY Left 06/19/2019   Procedure: LEFT MASTECTOMY WITH SENTINEL LYMPH NODE BIOPSY;  Surgeon: Stark Klein, MD;  Location: [place];  Service: General;  Laterality: Left;  . WISDOM TOOTH EXTRACTION      There were no vitals filed for this visit.  Subjective Assessment - 08/13/19 1500    Subjective  I am better today.  I slept crooked last night and my neck hurts pretty bad.    Pertinent History  Lt mastectomy and SLNB on 06/19/19 by Dr. Barry Dienes with 2 nodes removed. ER/PR negative HER2 positive.  History of seizures. No other treatments needed    Currently in Pain?  No/denies         [facility] PT Assessment - 08/13/19 0001      AROM   Left Shoulder Flexion  144 Degrees    Left Shoulder ABduction  130 Degrees                   [facility] Adult PT Treatment/Exercise - 08/13/19 0001      Manual Therapy   Manual therapy comments  MT performed with ice pack at left neck/UT    Soft tissue mobilization  to the Lt pectoralis complete belly    Myofascial Release  cross hands at various  places along the chest    Manual Lymphatic Drainage (MLD)  lt axillo inguinal pathway only and stationary circles towards the left axilla    Passive ROM  to the left shoulder today with less popping and some release to the pectoralis added at end range flexion and ER at 90deg                  PT Long Term Goals - 08/13/19 1651      PT LONG TERM GOAL #1   Title  Pt will improve lt shoulder abduction to 155 or greater    Status  On-going      PT LONG TERM GOAL #2   Title  Pt will improve Lt horizontal abduction to 30deg or greater    Status  On-going      PT LONG TERM GOAL #3   Title  Pt will return to all daily activities without limitations - including irish drumming    Status  Partially Met      PT LONG TERM GOAL #4   Title  Pt will be knowledgeable about lmyphedema and risk reduction    Status  Achieved            Plan - 08/13/19 1649    Clinical Impression Statement  Pt with less tightness today and also reporting improved mobility at home.  Less tightness at the pectoralis with STM and with PROM.  Continued with just MT today due to success of last treatment and pt is very consistent with her HEP. Still some some puffiness inferior to the incision and into the axilla but very very mild and still puffiness vs skin being looser from healing.    PT Frequency  2x / week    PT Duration  4 weeks    PT Treatment/Interventions  ADLs/Self Care Home Management;Manual lymph drainage;Manual techniques;Therapeutic exercise;Therapeutic activities;Patient/family education    PT Next Visit Plan  left chest release and MLD focus advance exercises as needed working towards strength ABC    Consulted and Agree with Plan of Care  Patient       Patient will benefit from skilled therapeutic intervention in order to improve the following deficits and impairments:  Decreased skin integrity, Increased fascial restricitons, Decreased scar mobility, Pain, Decreased range of motion,  Impaired UE functional use  Visit Diagnosis: Stiffness of left shoulder, not elsewhere classified  Aftercare following surgery for neoplasm  Abnormal posture     Problem List Patient Active Problem List   Diagnosis Date Noted  . Breast cancer, stage 0, left 06/19/2019  . Ductal carcinoma in situ (DCIS) of left breast 05/15/2019  . Insomnia 05/09/2018  . 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 Partial symptomatic epilepsy with complex partial seizures, not intractable, without status epilepticus ([place]) 04/10/2017    Stark Bray 08/13/2019, 4:52 PM  [place], [place], [address] Phone: [phone]   Fax:  [phone]  Name: Sally Nguyen MRN: [id number] Date of Birth: 01/21/1956

## 2019-08-18 ENCOUNTER — Ambulatory Visit: Admitting: Rehabilitation

## 2019-08-18 ENCOUNTER — Encounter: Payer: Self-pay | Admitting: Rehabilitation

## 2019-08-18 ENCOUNTER — Other Ambulatory Visit: Payer: Self-pay

## 2019-08-18 DIAGNOSIS — Z483 Aftercare following surgery for neoplasm: Secondary | ICD-10-CM | POA: Diagnosis not present

## 2019-08-18 DIAGNOSIS — R293 Abnormal posture: Secondary | ICD-10-CM

## 2019-08-18 DIAGNOSIS — M25612 Stiffness of left shoulder, not elsewhere classified: Secondary | ICD-10-CM

## 2019-08-18 NOTE — Therapy (Signed)
[place], [place], [address] Phone: [phone]   Fax:  [phone]  Physical Therapy Treatment  Patient Details  Name: Sally Nguyen MRN: [id number] Date of Birth: November 09, 1956 Referring Provider (PT): Dr. Barry Dienes   Encounter Date: 08/18/2019  PT End of Session - 08/18/19 1453    Visit Number  9    Number of Visits  15   15   Date for PT Re-Evaluation  09/08/19    PT Start Time  1400    PT Stop Time  1450    PT Time Calculation (min)  50 min    Activity Tolerance  Patient tolerated treatment well    Behavior During Therapy  [facility] for tasks assessed/performed       Past Medical History:  Diagnosis Date  . Cancer ([place]) 05/2019   left breast DCIS  . Seizures ([place])   . Vision abnormalities     Past Surgical History:  Procedure Laterality Date  . COLONOSCOPY    . MASTECTOMY W/ SENTINEL NODE BIOPSY Left 06/19/2019   Procedure: LEFT MASTECTOMY WITH SENTINEL LYMPH NODE BIOPSY;  Surgeon: Stark Klein, MD;  Location: [place];  Service: General;  Laterality: Left;  . WISDOM TOOTH EXTRACTION      There were no vitals filed for this visit.  Subjective Assessment - 08/18/19 1403    Subjective  I am just stiff in the morning but feeling pretty good.  The puffiness hasn't changed and it may just be skin. I feel about 55% in terms of mobility but I think I think about things too much.  I can't wait until I wash my hair without thinking about it.    Pertinent History  Lt mastectomy and SLNB on 06/19/19 by Dr. Barry Dienes with 2 nodes removed. ER/PR negative HER2 positive.  History of seizures. No other treatments needed    Patient Stated Goals  get back to normal    Currently in Pain?  No/denies         [facility] PT Assessment - 08/18/19 0001      AROM   Left Shoulder Flexion  147 Degrees    Left Shoulder ABduction  134 Degrees    Left Shoulder Internal Rotation  --   behind the back equal                   [facility] Adult PT Treatment/Exercise - 08/18/19 0001      Manual Therapy   Soft tissue mobilization  to the Lt pectoralis complete belly in supine and sidelying with the proximal tightness more evident in sidelying    Myofascial Release  cross hands at various places along the chest    Passive ROM  to the left shoulder in sidelying into abduction and supine to tolerance into flexion, abduction, ER at 90deg of abduction, and Y position                  PT Long Term Goals - 08/18/19 1459      PT LONG TERM GOAL #1   Title  Pt will improve lt shoulder abduction to 155 or greater    Status  On-going      PT LONG TERM GOAL #2   Title  Pt will improve Lt horizontal abduction to 30deg or greater    Status  On-going      PT LONG TERM GOAL #3   Title  Pt will return to all daily activities without  limitations - including irish drumming    Status  On-going      PT LONG TERM GOAL #4   Title  Pt will be knowledgeable about lmyphedema and risk reduction    Status  Achieved            Plan - 08/18/19 1454    Clinical Impression Statement  Pt continues with pectoralis tightness in the superior chest above the incision more evident in sidelying.  Continued to focus on STM and manual work as pt is still good with home exercises.  Most limitation with PROM/AROM sidelying.    PT Frequency  2x / week    PT Duration  4 weeks    PT Treatment/Interventions  ADLs/Self Care Home Management;Manual lymph drainage;Manual techniques;Therapeutic exercise;Therapeutic activities;Patient/family education    PT Next Visit Plan  left chest release working towards strength ABC    Consulted and Agree with Plan of Care  Patient       Patient will benefit from skilled therapeutic intervention in order to improve the following deficits and impairments:     Visit Diagnosis: Stiffness of left shoulder, not elsewhere classified  Aftercare following surgery for  neoplasm  Abnormal posture     Problem List Patient Active Problem List   Diagnosis Date Noted  . Breast cancer, stage 0, left 06/19/2019  . Ductal carcinoma in situ (DCIS) of left breast 05/15/2019  . Insomnia 05/09/2018  . 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 Partial symptomatic epilepsy with complex partial seizures, not intractable, without status epilepticus ([facility]) 04/10/2017    Sally Nguyen, Sally Nguyen 08/18/2019, 3:00 PM  [facility] [address] [place], [place], [address] Phone: [phone]   Fax:  [phone]  Name: Sally Nguyen MRN: [id number] Date of Birth: 07/02/1956   

## 2019-08-20 ENCOUNTER — Ambulatory Visit

## 2019-08-20 ENCOUNTER — Other Ambulatory Visit: Payer: Self-pay

## 2019-08-20 DIAGNOSIS — R293 Abnormal posture: Secondary | ICD-10-CM

## 2019-08-20 DIAGNOSIS — Z483 Aftercare following surgery for neoplasm: Secondary | ICD-10-CM | POA: Diagnosis not present

## 2019-08-20 DIAGNOSIS — M25612 Stiffness of left shoulder, not elsewhere classified: Secondary | ICD-10-CM

## 2019-08-20 NOTE — Therapy (Signed)
[place], [place], [address] Phone: [phone]   Fax:  [phone]  Physical Therapy Treatment  Patient Details  Name: Sally Nguyen MRN: [id number] Date of Birth: August 20, 1956 Referring Provider (PT): Dr. Barry Dienes   Encounter Date: 08/20/2019  PT End of Session - 08/20/19 1512    Visit Number  10    Number of Visits  15    Date for PT Re-Evaluation  09/08/19    Authorization Type  tricare    PT Start Time  1408    PT Stop Time  1502    PT Time Calculation (min)  54 min    Activity Tolerance  Patient tolerated treatment well    Behavior During Therapy  [facility] for tasks assessed/performed       Past Medical History:  Diagnosis Date  . Cancer ([place]) 05/2019   left breast DCIS  . Seizures ([place])   . Vision abnormalities     Past Surgical History:  Procedure Laterality Date  . COLONOSCOPY    . MASTECTOMY W/ SENTINEL NODE BIOPSY Left 06/19/2019   Procedure: LEFT MASTECTOMY WITH SENTINEL LYMPH NODE BIOPSY;  Surgeon: Stark Klein, MD;  Location: [place];  Service: General;  Laterality: Left;  . WISDOM TOOTH EXTRACTION      There were no vitals filed for this visit.                    [facility] Adult PT Treatment/Exercise - 08/20/19 0001      Manual Therapy   Soft tissue mobilization  to the L pectoralis, wing of the L lattissimus dorsi, L biceps and deltoid in adduction and abduction w/light TpR release in the L pec followed by MLD to decrease risk for lymphedema.     Myofascial Release  Cross hand myofascial release across the L anterior chest wall w/an emphasis over scar tissue.     Manual Lymphatic Drainage (MLD)  MLD performed on the trunk with an emphasis on the L axillo-inguinal anastamosis due to STM in this area to improve fluid flow and decrease risk for lymphedema.     Passive ROM  to the L shoulder w/light over pressure pain-free w/light distraction during  ER and abduction.              PT Education - 08/20/19 1510    Education Details  Discussed HEP with pt and timeline for ROM deficits/numbness following surgery within physical therapist scope of practice giving general guidelines and emphasizing that every body is different and time lines can vary.    Person(s) Educated  Patient    Comprehension  Verbalized understanding          PT Long Term Goals - 08/18/19 1459      PT LONG TERM GOAL #1   Title  Pt will improve lt shoulder abduction to 155 or greater    Status  On-going      PT LONG TERM GOAL #2   Title  Pt will improve Lt horizontal abduction to 30deg or greater    Status  On-going      PT LONG TERM GOAL #3   Title  Pt will return to all daily activities without limitations - including irish drumming    Status  On-going      PT LONG TERM GOAL #4   Title  Pt will be knowledgeable about lmyphedema and risk reduction    Status  Achieved  Plan - 08/20/19 1512    Clinical Impression Statement  Pt continues with pectoralis tightness in the superior chest and in the L wing of the latissimus dorsi w/small triggerpoint at the lateral edge of the L pectoralis major/pec minor. Focus this session on STM and manual work in order to improve functional mobility w/light distraction during PROM in order to decrease compression at the [place]. Discussed HEP with patient and the importance of continuing to progress at home w/o aggressive/painful stretching.    Examination-Activity Limitations  Lift;Caring for Others;Carry    Examination-Participation Restrictions  [employer]    Stability/Clinical Decision Making  Stable/Uncomplicated    Rehab Potential  Excellent    PT Frequency  2x / week    PT Duration  4 weeks    PT Treatment/Interventions  ADLs/Self Care Home Management;Manual lymph drainage;Manual techniques;Therapeutic exercise;Therapeutic activities;Patient/family education    PT Next Visit Plan   Continue with Soft tissue work over the L pec start with light resistance. Measure ROM for flexion and abd.    PT Home Exercise Plan  Access Code: [email or web address]    Consulted and Agree with Plan of Care  Patient       Patient will benefit from skilled therapeutic intervention in order to improve the following deficits and impairments:     Visit Diagnosis: Stiffness of left shoulder, not elsewhere classified  Aftercare following surgery for neoplasm  Abnormal posture     Problem List Patient Active Problem List   Diagnosis Date Noted  . Breast cancer, stage 0, left 06/19/2019  . Ductal carcinoma in situ (DCIS) of left breast 05/15/2019  . Insomnia 05/09/2018  . 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 Partial symptomatic epilepsy with complex partial seizures, not intractable, without status epilepticus ([place]) 04/10/2017    Ander Purpura, PT 08/20/2019, [place], [place], [address] Phone: [phone]   Fax:  [phone]  Name: Sally Nguyen MRN: [id number] Date of Birth: Apr 12, 1956

## 2019-08-25 ENCOUNTER — Ambulatory Visit

## 2019-08-25 ENCOUNTER — Other Ambulatory Visit: Payer: Self-pay

## 2019-08-25 DIAGNOSIS — M25612 Stiffness of left shoulder, not elsewhere classified: Secondary | ICD-10-CM

## 2019-08-25 DIAGNOSIS — Z483 Aftercare following surgery for neoplasm: Secondary | ICD-10-CM | POA: Diagnosis not present

## 2019-08-25 DIAGNOSIS — R293 Abnormal posture: Secondary | ICD-10-CM

## 2019-08-25 NOTE — Therapy (Signed)
[place], [place], [address] Phone: [phone]   Fax:  [phone]  Physical Therapy Treatment  Patient Details  Name: Sally Nguyen MRN: [id number] Date of Birth: 1956/11/09 Referring Provider (PT): Dr. Barry Dienes   Encounter Date: 08/25/2019  PT End of Session - 08/25/19 1529    Visit Number  11    Number of Visits  15    Date for PT Re-Evaluation  09/08/19    PT Start Time  1435    PT Stop Time  1519    PT Time Calculation (min)  44 min    Activity Tolerance  Patient tolerated treatment well    Behavior During Therapy  [facility] for tasks assessed/performed       Past Medical History:  Diagnosis Date  . Cancer ([place]) 05/2019   left breast DCIS  . Seizures ([place])   . Vision abnormalities     Past Surgical History:  Procedure Laterality Date  . COLONOSCOPY    . MASTECTOMY W/ SENTINEL NODE BIOPSY Left 06/19/2019   Procedure: LEFT MASTECTOMY WITH SENTINEL LYMPH NODE BIOPSY;  Surgeon: Stark Klein, MD;  Location: [place];  Service: General;  Laterality: Left;  . WISDOM TOOTH EXTRACTION      There were no vitals filed for this visit.  Subjective Assessment - 08/25/19 1442    Subjective  I feel like my Lt shoulder ROM is almost there. I was able to hold my keltic drum and playa song last week (it fits snugly under axilla)    Pertinent History  Lt mastectomy and SLNB on 06/19/19 by Dr. Barry Dienes with 2 nodes removed. ER/PR negative HER2 positive.  History of seizures. No other treatments needed    Patient Stated Goals  get back to normal    Currently in Pain?  No/denies                       [facility] Adult PT Treatment/Exercise - 08/25/19 0001      Manual Therapy   Manual Therapy  Myofascial release;Manual Lymphatic Drainage (MLD);Passive ROM;Taping    Soft tissue mobilization  --    Myofascial Release  Cross hand myofascial release across the Lt anterior chest wall  w/an emphasis over scar tissue and to axilla at end P/ROM.     Manual Lymphatic Drainage (MLD)  In Supine: Lt inguinal nodes and Lt axillo-inguinal anastomosis focusing on lateral trunk    Passive ROM  to the Lt shoulder w/light over pressure pain-free w/light distraction during flexion, abduction. and D2    Kinesiotex  Edema      Kinesiotix   Edema  Trial today after applying skincote, 2 fingers running along Lt axillo-inguinal anastomosis to inferior area of incision (avoiding mastectomy and drain incisions) where persistent swelling present                  PT Long Term Goals - 08/18/19 1459      PT LONG TERM GOAL #1   Title  Pt will improve lt shoulder abduction to 155 or greater    Status  On-going      PT LONG TERM GOAL #2   Title  Pt will improve Lt horizontal abduction to 30deg or greater    Status  On-going      PT LONG TERM GOAL #3   Title  Pt will return to all daily activities without limitations - including irish drumming    Status  On-going      PT LONG TERM GOAL #4   Title  Pt will be knowledgeable about lmyphedema and risk reduction    Status  Achieved            Plan - 08/25/19 1530    Clinical Impression Statement  Continued with focus on manual therapy today working on progressing pts end ROM and to decrease swelling inferior to incision. Pt agreeable to trial of kinesiotape here and reports no known adhesive allergies. So after applying skincote, placed kinesiotape as described on flowsheet. Pt was instructed how to remove this safely and slowly to avoid skin irritation and to let us know how her skin looked after.    Examination-Activity Limitations  Lift;Caring for Others;Carry    Examination-Participation Restrictions  [employer]    Stability/Clinical Decision Making  Stable/Uncomplicated    Rehab Potential  Excellent    PT Frequency  2x / week    PT Duration  4 weeks    PT Treatment/Interventions  ADLs/Self Care Home  Management;Manual lymph drainage;Manual techniques;Therapeutic exercise;Therapeutic activities;Patient/family education    PT Next Visit Plan  Continue with Soft tissue work over the L pec start with light resistance. Measure ROM for flexion and abd. Assess kinesiotape (how was skin after removal?) and cont if deemed beneficial by pt.    PT Home Exercise Plan  Access Code: [email or web address]    Consulted and Agree with Plan of Care  Patient       Patient will benefit from skilled therapeutic intervention in order to improve the following deficits and impairments:  Decreased skin integrity, Increased fascial restricitons, Decreased scar mobility, Pain, Decreased range of motion, Impaired UE functional use  Visit Diagnosis: Stiffness of left shoulder, not elsewhere classified  Aftercare following surgery for neoplasm  Abnormal posture     Problem List Patient Active Problem List   Diagnosis Date Noted  . Breast cancer, stage 0, left 06/19/2019  . Ductal carcinoma in situ (DCIS) of left breast 05/15/2019  . Insomnia 05/09/2018  . 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 Partial symptomatic epilepsy with complex partial seizures, not intractable, without status epilepticus ([place]) 04/10/2017    Otelia Limes, PTA 08/25/2019, 3:33 PM  [place], [place], [address] Phone: [phone]   Fax:  [phone]  Name: Sally Nguyen MRN: [id number] Date of Birth: July 29, 1956

## 2019-08-29 ENCOUNTER — Encounter: Payer: Self-pay | Admitting: *Deleted

## 2019-09-01 ENCOUNTER — Ambulatory Visit: Admitting: Rehabilitation

## 2019-09-01 ENCOUNTER — Encounter: Payer: Self-pay | Admitting: Rehabilitation

## 2019-09-01 ENCOUNTER — Other Ambulatory Visit: Payer: Self-pay

## 2019-09-01 DIAGNOSIS — Z483 Aftercare following surgery for neoplasm: Secondary | ICD-10-CM

## 2019-09-01 DIAGNOSIS — R293 Abnormal posture: Secondary | ICD-10-CM

## 2019-09-01 DIAGNOSIS — M25612 Stiffness of left shoulder, not elsewhere classified: Secondary | ICD-10-CM

## 2019-09-01 NOTE — Therapy (Signed)
[place], [place], [address] Phone: [phone]   Fax:  [phone]  Physical Therapy Treatment  Patient Details  Name: Sally Nguyen MRN: [id number] Date of Birth: 1956/05/31 Referring Provider (PT): Dr. Barry Dienes   Encounter Date: 09/01/2019  PT End of Session - 09/01/19 1603    Visit Number  12    Number of Visits  15    Date for PT Re-Evaluation  09/08/19    Authorization Type  tricare    PT Start Time  1500    PT Stop Time  1546    PT Time Calculation (min)  46 min    Activity Tolerance  Patient tolerated treatment well    Behavior During Therapy  [facility] for tasks assessed/performed       Past Medical History:  Diagnosis Date  . Cancer ([place]) 05/2019   left breast DCIS  . Seizures ([place])   . Vision abnormalities     Past Surgical History:  Procedure Laterality Date  . COLONOSCOPY    . MASTECTOMY W/ SENTINEL NODE BIOPSY Left 06/19/2019   Procedure: LEFT MASTECTOMY WITH SENTINEL LYMPH NODE BIOPSY;  Surgeon: Stark Klein, MD;  Location: [place];  Service: General;  Laterality: Left;  . WISDOM TOOTH EXTRACTION      There were no vitals filed for this visit.  Subjective Assessment - 09/01/19 1500    Subjective  I think the tape did not help.  It is just the same.  I am doing better.    Pertinent History  Lt mastectomy and SLNB on 06/19/19 by Dr. Barry Dienes with 2 nodes removed. ER/PR negative HER2 positive.  History of seizures. No other treatments needed    Limitations  Lifting    Patient Stated Goals  get back to normal    Currently in Pain?  No/denies         [facility] PT Assessment - 09/01/19 0001      AROM   Left Shoulder Flexion  147 Degrees    Left Shoulder ABduction  141 Degrees    Left Shoulder Horizontal ABduction  14 Degrees                   [facility] Adult PT Treatment/Exercise - 09/01/19 0001      Exercises   Other Exercises   educated pt on  strength ABC program able to get through the whole program today and performance of each weight exercise x 10 with 2#.  Cueing for all.  Did not perform quadruped today but discussed it.  Pt to attempt this along with supine dowel at home.       Manual Therapy   Passive ROM  to the left shoulder into all planes including D2 and ER at various angles.             PT Education - 09/01/19 1603    Education Details  strength ABC    Person(s) Educated  Patient    Methods  Explanation;Demonstration;Tactile cues;Verbal cues;Handout    Comprehension  Verbalized understanding;Returned demonstration;Verbal cues required;Tactile cues required          PT Long Term Goals - 09/01/19 1606      PT LONG TERM GOAL #1   Title  Pt will improve lt shoulder abduction to 155 or greater    Baseline  92; 07/30/19:132, 9/28:137    Status  On-going      PT LONG TERM GOAL #2   Title  Pt will improve Lt horizontal abduction to 30deg or greater    Baseline  -30, 8/26: 5deg, 9/28: 15deg    Status  On-going      PT LONG TERM GOAL #3   Title  Pt will return to all daily activities without limitations - including irish drumming    Status  Achieved      PT LONG TERM GOAL #4   Title  Pt will be knowledgeable about lmyphedema and risk reduction    Status  Achieved            Plan - 09/01/19 1604    Clinical Impression Statement  Pt demonstrating continued improvements in AROM especially into horizontal abduction.  Pt is also noticeably more positive and not as frustrated today as she can notice more functional improvements and has been able to play her hand drum.  Started strength ABC today with good understanding.  Continued with PROM of the shoulder with remaining time.    Examination-Activity Limitations  Lift;Caring for Others;Carry    Examination-Participation Restrictions  Yard Work;Cleaning;Laundry    PT Frequency  2x / week    PT Duration  4 weeks    PT Treatment/Interventions  ADLs/Self  Care Home Management;Manual lymph drainage;Manual techniques;Therapeutic exercise;Therapeutic activities;Patient/family education    PT Next Visit Plan  perform strength ABC again? any questions? Continue with Soft tissue work over the L pec and ROM for the left shoulder    PT Home Exercise Plan  Access Code: [email or web address] and strength ABC       Patient will benefit from skilled therapeutic intervention in order to improve the following deficits and impairments:  Decreased skin integrity, Increased fascial restricitons, Decreased scar mobility, Pain, Decreased range of motion, Impaired UE functional use  Visit Diagnosis: Stiffness of left shoulder, not elsewhere classified  Aftercare following surgery for neoplasm  Abnormal posture     Problem List Patient Active Problem List   Diagnosis Date Noted  . Breast cancer, stage 0, left 06/19/2019  . Ductal carcinoma in situ (DCIS) of left breast 05/15/2019  . Insomnia 05/09/2018  . 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 Partial symptomatic epilepsy with complex partial seizures, not intractable, without status epilepticus ([place]) 04/10/2017    Stark Bray 09/01/2019, 4:07 PM  [place], [place], [address] Phone: [phone]   Fax:  [phone]  Name: JACKLINE CASTILLA MRN: [id number] Date of Birth: August 15, 1956

## 2019-09-03 ENCOUNTER — Encounter: Payer: Self-pay | Admitting: Rehabilitation

## 2019-09-03 ENCOUNTER — Other Ambulatory Visit: Payer: Self-pay

## 2019-09-03 ENCOUNTER — Ambulatory Visit: Admitting: Rehabilitation

## 2019-09-03 DIAGNOSIS — Z483 Aftercare following surgery for neoplasm: Secondary | ICD-10-CM | POA: Diagnosis not present

## 2019-09-03 DIAGNOSIS — R293 Abnormal posture: Secondary | ICD-10-CM

## 2019-09-03 DIAGNOSIS — M25612 Stiffness of left shoulder, not elsewhere classified: Secondary | ICD-10-CM

## 2019-09-03 NOTE — Therapy (Signed)
[place], [place], [address] Phone: [phone]   Fax:  [phone]  Physical Therapy Treatment  Patient Details  Name: Sally Nguyen MRN: [id number] Date of Birth: Sep 04, 1956 Referring Provider (PT): Dr. Barry Dienes   Encounter Date: 09/03/2019  PT End of Session - 09/03/19 1554    Visit Number  13    Number of Visits  15    Date for PT Re-Evaluation  09/08/19    Authorization Type  tricare    PT Start Time  1500    PT Stop Time  1550    PT Time Calculation (min)  50 min    Activity Tolerance  Patient tolerated treatment well    Behavior During Therapy  [facility] for tasks assessed/performed       Past Medical History:  Diagnosis Date  . Cancer ([place]) 05/2019   left breast DCIS  . Seizures ([place])   . Vision abnormalities     Past Surgical History:  Procedure Laterality Date  . COLONOSCOPY    . MASTECTOMY W/ SENTINEL NODE BIOPSY Left 06/19/2019   Procedure: LEFT MASTECTOMY WITH SENTINEL LYMPH NODE BIOPSY;  Surgeon: Sally Klein, MD;  Location: [facility];  Service: General;  Laterality: Left;  . WISDOM TOOTH EXTRACTION      There were no vitals filed for this visit.  Subjective Assessment - 09/03/19 1501    Subjective  I tried the strength ABC and have some questions    Pertinent History  Lt mastectomy and SLNB on 06/19/19 by Dr. Barry Dienes with 2 nodes removed. ER/PR negative HER2 positive.  History of seizures. No other treatments needed    Currently in Pain?  Yes    Pain Score  1     Pain Location  Shoulder    Pain Orientation  Left;Right    Pain Descriptors / Indicators  Aching    Pain Type  Acute pain   I am sore from strength ABC                      [facility] Adult PT Treatment/Exercise - 09/03/19 0001      Exercises   Other Exercises   performed strength ABC again with any questions answered; modified chest press to less of an angle to decrease popping.  Good  performance of all.  x  32 minutes total      Manual Therapy   Soft tissue mobilization  to the left pectoralis and latissimus in netural and flexed shoulder position with visible release    Passive ROM  to the left shoulder into all planes including D2 and ER at various angles.                  PT Long Term Goals - 09/01/19 1606      PT LONG TERM GOAL #1   Title  Pt will improve lt shoulder abduction to 155 or greater    Baseline  92; 07/30/19:132, 9/28:137    Status  On-going      PT LONG TERM GOAL #2   Title  Pt will improve Lt horizontal abduction to 30deg or greater    Baseline  -30, 8/26: 5deg, 9/28: 15deg    Status  On-going      PT LONG TERM GOAL #3   Title  Pt will return to all daily activities without limitations - including irish drumming    Status  Achieved  PT LONG TERM GOAL #4   Title  Pt will be knowledgeable about lmyphedema and risk reduction    Status  Achieved            Plan - 09/03/19 1554    Clinical Impression Statement  Continued with strength ABC education today with pt having a few questions on performance.  All answered today and pt has her own 3# weights for home.  Reminder not to do it too often.  Continued with STM and release at the left chest with this still imprving with STM.  Pt also still feels much looser after STM.    PT Frequency  2x / week    PT Duration  4 weeks    PT Treatment/Interventions  ADLs/Self Care Home Management;Manual lymph drainage;Manual techniques;Therapeutic exercise;Therapeutic activities;Patient/family education    PT Next Visit Plan  STM for the left chest and Lt shoulder ROM work and exercise    PT Home Exercise Plan  Access Code: [email or web address] and strength ABC    Consulted and Agree with Plan of Care  Patient       Patient will benefit from skilled therapeutic intervention in order to improve the following deficits and impairments:     Visit Diagnosis: Aftercare following surgery for  neoplasm  Stiffness of left shoulder, not elsewhere classified  Abnormal posture     Problem List Patient Active Problem List   Diagnosis Date Noted  . Breast cancer, stage 0, left 06/19/2019  . Ductal carcinoma in situ (DCIS) of left breast 05/15/2019  . Insomnia 05/09/2018  . 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 Partial symptomatic epilepsy with complex partial seizures, not intractable, without status epilepticus ([place]) 04/10/2017    Sally Nguyen 09/03/2019, 3:57 PM  [place], [place], [address] Phone: [phone]   Fax:  [phone]  Name: Sally Nguyen MRN: [id number] Date of Birth: 1956/03/04

## 2019-09-11 ENCOUNTER — Encounter: Payer: Self-pay | Admitting: Rehabilitation

## 2019-09-11 ENCOUNTER — Other Ambulatory Visit: Payer: Self-pay

## 2019-09-11 ENCOUNTER — Ambulatory Visit: Attending: General Surgery | Admitting: Rehabilitation

## 2019-09-11 DIAGNOSIS — R293 Abnormal posture: Secondary | ICD-10-CM | POA: Insufficient documentation

## 2019-09-11 DIAGNOSIS — M25612 Stiffness of left shoulder, not elsewhere classified: Secondary | ICD-10-CM | POA: Insufficient documentation

## 2019-09-11 DIAGNOSIS — Z483 Aftercare following surgery for neoplasm: Secondary | ICD-10-CM | POA: Insufficient documentation

## 2019-09-11 NOTE — Therapy (Signed)
[place], [place], [address] Phone: [phone]   Fax:  [phone]  Physical Therapy Treatment  Patient Details  Name: Sally Nguyen MRN: [id number] Date of Birth: [birth year] Referring Provider (PT): Dr. Barry Dienes   Encounter Date: 09/11/2019  PT End of Session - 09/11/19 1549    Visit Number  14    Number of Visits  15    Date for PT Re-Evaluation  09/08/19    PT Start Time  1500    PT Stop Time  1545    PT Time Calculation (min)  45 min    Activity Tolerance  Patient tolerated treatment well    Behavior During Therapy  [facility] for tasks assessed/performed       Past Medical History:  Diagnosis Date  . Cancer ([place]) 05/2019   left breast DCIS  . Seizures ([place])   . Vision abnormalities     Past Surgical History:  Procedure Laterality Date  . COLONOSCOPY    . MASTECTOMY W/ SENTINEL NODE BIOPSY Left 06/19/2019   Procedure: LEFT MASTECTOMY WITH SENTINEL LYMPH NODE BIOPSY;  Surgeon: Stark Klein, MD;  Location: [facility];  Service: General;  Laterality: Left;  . WISDOM TOOTH EXTRACTION      There were no vitals filed for this visit.  Subjective Assessment - 09/11/19 1504    Subjective  My mornings are feeling better.  I'm less "crinkled".  I am doing weights 2-3x per week and stretching on the other days    Pertinent History  Lt mastectomy and SLNB on 06/19/19 by Dr. Barry Dienes with 2 nodes removed. ER/PR negative HER2 positive.  History of seizures. No other treatments needed    Patient Stated Goals  get back to normal    Currently in Pain?  No/denies         [facility] PT Assessment - 09/11/19 0001      AROM   Left Shoulder Flexion  148 Degrees    Left Shoulder ABduction  146 Degrees    Left Shoulder Horizontal ABduction  30 Degrees                   [facility] Adult PT Treatment/Exercise - 09/11/19 0001      Manual Therapy   Soft tissue mobilization  to the left  pectoralis and latissimus in netural and flexed shoulder position with visible release    Myofascial Release  Cross hand myofascial release across the Lt anterior chest wall w/an emphasis over scar tissue and to axilla at end P/ROM.     Passive ROM  to the left shoulder into all planes including D2 and ER at various angles.                  PT Long Term Goals - 09/01/19 1606      PT LONG TERM GOAL #1   Title  Pt will improve lt shoulder abduction to 155 or greater    Baseline  92; 07/30/19:132, 9/28:137    Status  On-going      PT LONG TERM GOAL #2   Title  Pt will improve Lt horizontal abduction to 30deg or greater    Baseline  -30, 8/26: 5deg, 9/28: 15deg    Status  On-going      PT LONG TERM GOAL #3   Title  Pt will return to all daily activities without limitations - including irish drumming    Status  Achieved  PT LONG TERM GOAL #4   Title  Pt will be knowledgeable about lmyphedema and risk reduction    Status  Achieved            Plan - 09/11/19 1550    Clinical Impression Statement  Continued with left shoulder PROM and myofascial release.  Pt still gaining a few degrees of mobility but may be getting close to her end range mobility.  Pt Still with pectoralis tightness into the incision area with D2 flexion position.  1 more visit    PT Frequency  2x / week    PT Duration  4 weeks    PT Treatment/Interventions  ADLs/Self Care Home Management;Manual lymph drainage;Manual techniques;Therapeutic exercise;Therapeutic activities;Patient/family education    PT Home Exercise Plan  Access Code: [email or web address] and strength ABC    Consulted and Agree with Plan of Care  Patient       Patient will benefit from skilled therapeutic intervention in order to improve the following deficits and impairments:     Visit Diagnosis: Aftercare following surgery for neoplasm  Stiffness of left shoulder, not elsewhere classified  Abnormal posture     Problem List Patient  Active Problem List   Diagnosis Date Noted  . Breast cancer, stage 0, left 06/19/2019  . Ductal carcinoma in situ (DCIS) of left breast 05/15/2019  . Insomnia 05/09/2018  . 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 Partial symptomatic epilepsy with complex partial seizures, not intractable, without status epilepticus ([place]) 04/10/2017    Stark Bray 09/11/2019, 3:53 PM  [place], [place], [address] Phone: [phone]   Fax:  [phone]  Name: Sally Nguyen MRN: [id number] Date of Birth: [birth year]

## 2019-09-18 ENCOUNTER — Other Ambulatory Visit: Payer: Self-pay

## 2019-09-18 ENCOUNTER — Encounter: Payer: Self-pay | Admitting: Rehabilitation

## 2019-09-18 ENCOUNTER — Ambulatory Visit: Admitting: Rehabilitation

## 2019-09-18 DIAGNOSIS — R293 Abnormal posture: Secondary | ICD-10-CM

## 2019-09-18 DIAGNOSIS — Z483 Aftercare following surgery for neoplasm: Secondary | ICD-10-CM | POA: Diagnosis not present

## 2019-09-18 DIAGNOSIS — M25612 Stiffness of left shoulder, not elsewhere classified: Secondary | ICD-10-CM

## 2019-09-18 NOTE — Therapy (Signed)
[place], [place], [address] Phone: [phone]   Fax:  [phone]  Physical Therapy Treatment  Patient Details  Name: Sally Nguyen MRN: [id number] Date of Birth: 07/20/1956 Referring Provider (PT): Dr. Barry Dienes   Encounter Date: 09/18/2019  PT End of Session - 09/18/19 1354    Visit Number  15    Number of Visits  18    Date for PT Re-Evaluation  10/30/19    PT Start Time  1300    PT Stop Time  1353    PT Time Calculation (min)  53 min    Activity Tolerance  Patient tolerated treatment well    Behavior During Therapy  [facility] for tasks assessed/performed       Past Medical History:  Diagnosis Date  . Cancer ([place]) 05/2019   left breast DCIS  . Seizures ([place])   . Vision abnormalities     Past Surgical History:  Procedure Laterality Date  . COLONOSCOPY    . MASTECTOMY W/ SENTINEL NODE BIOPSY Left 06/19/2019   Procedure: LEFT MASTECTOMY WITH SENTINEL LYMPH NODE BIOPSY;  Surgeon: Sally Klein, MD;  Location: [facility];  Service: General;  Laterality: Left;  . WISDOM TOOTH EXTRACTION      There were no vitals filed for this visit.  Subjective Assessment - 09/18/19 1306    Subjective  I just have a headache.  I had a really weird tightness about 3 days ago with tingling and a small pain in the sternum.  It last a few hours and is gone. I will be getting a prosthesis soon. Strength ABC is going well.  All exercises good.  I feel good with handling the swelling.    Pertinent History  Lt mastectomy and SLNB on 06/19/19 by Dr. Barry Dienes with 2 nodes removed. ER/PR negative HER2 positive.  History of seizures. No other treatments needed    Patient Stated Goals  get back to normal    Currently in Pain?  No/denies         [facility] PT Assessment - 09/18/19 0001      AROM   Left Shoulder Flexion  152 Degrees    Left Shoulder ABduction  158 Degrees    Left Shoulder Horizontal ABduction   30 Degrees                   [facility] Adult PT Treatment/Exercise - 09/18/19 0001      Self-Care   Other Self-Care Comments   discussion at the begining about new sensations in the chest, and independence with exercises and swelling management      Manual Therapy   Soft tissue mobilization  to the left pectoralis and latissimus in netural and flexed shoulder position with visible release    Myofascial Release  Cross hand myofascial release across the Lt anterior chest wall w/an emphasis over scar tissue and to axilla at end P/ROM.     Passive ROM  to the left shoulder into all planes including D2 and ER at various angles.                  PT Long Term Goals - 09/18/19 1319      PT LONG TERM GOAL #1   Title  Pt will improve lt shoulder abduction to 155 or greater    Baseline  92; 07/30/19:132, 9/28:137,  09/18/19;  152 on 10/15    Status  Partially Met  PT LONG TERM GOAL #2   Title  Pt will improve Lt horizontal abduction to 30deg or greater    Baseline  -30, 8/26: 5deg, 9/28: 15deg, 10/15: 30    Status  Achieved      PT LONG TERM GOAL #3   Title  Pt will return to all daily activities without limitations - including irish drumming    Status  Achieved      PT LONG TERM GOAL #4   Title  Pt will be knowledgeable about lmyphedema and risk reduction    Status  Achieved            Plan - 09/18/19 1355    Clinical Impression Statement  Pt continueing to make improvements in her AROM with all goals except flexion met, missed by 2 degrees.  Pt could be ready for DC today but pt is nervous about regressing so after discussion we decided to check in in another 2 weeks.    PT Frequency  Biweekly    PT Duration  4 weeks    PT Treatment/Interventions  ADLs/Self Care Home Management;Manual lymph drainage;Manual techniques;Therapeutic exercise;Therapeutic activities;Patient/family education    PT Next Visit Plan  STM for the left chest and Lt shoulder ROM work  and exercise    PT Home Exercise Plan  Access Code: [email or web address] and strength ABC    Consulted and Agree with Plan of Care  Patient       Patient will benefit from skilled therapeutic intervention in order to improve the following deficits and impairments:     Visit Diagnosis: Aftercare following surgery for neoplasm  Stiffness of left shoulder, not elsewhere classified  Abnormal posture     Problem List Patient Active Problem List   Diagnosis Date Noted  . Breast cancer, stage 0, left 06/19/2019  . Ductal carcinoma in situ (DCIS) of left breast 05/15/2019  . Insomnia 05/09/2018  . 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 Partial symptomatic epilepsy with complex partial seizures, not intractable, without status epilepticus ([place]) 04/10/2017    Sally Nguyen 09/18/2019, 1:57 PM  [place], [place], [address] Phone: [phone]   Fax:  [phone]  Name: Sally Nguyen MRN: [id number] Date of Birth: 06-15-1956

## 2019-10-02 ENCOUNTER — Ambulatory Visit: Admitting: Rehabilitation

## 2019-10-14 NOTE — Progress Notes (Addendum)
Patient Care Team: [name], [place] as PCP - General (General Practice) Mauro Kaufmann, RN as Oncology Nurse Navigator Rockwell Germany, RN as Oncology Nurse Navigator Stark Klein, MD as Consulting Physician (General Surgery) Truitt Merle, MD as Consulting Physician (Hematology) Eppie Gibson, MD as Attending Physician (Radiation Oncology) Alla Feeling, NP as Nurse Practitioner (Nurse Practitioner)  CLINIC:  Survivorship   REASON FOR VISIT:  Routine follow-up post-treatment for a recent history of breast cancer.  BRIEF ONCOLOGIC HISTORY:  Oncology History  Ductal carcinoma in situ (DCIS) of left breast  05/08/2019 Mammogram   Mammogram 05/08/19  IMPRESSION: Suspicious microcalcifications over the outer midportion of the left breast spanning 2.4 x 3.3 x 3.9 cm.   05/14/2019 Initial Biopsy   Diagnosis 05/14/19  Breast, left, needle core biopsy, outer mid - DUCTAL CARCINOMA IN SITU WITH CALCIFICATIONS. - SEE COMMENT. Estrogen Receptor: 0%, NEGATIVE Progesterone Receptor: 0%, NEGATIVE   05/14/2019 Cancer Staging   Staging form: Breast, AJCC 8th Edition - Clinical stage from 05/14/2019: Stage Unknown (cTis (DCIS), cNX, cM0, ER-, PR-, HER2: Not Assessed) - Signed by Truitt Merle, MD on 05/20/2019 Cancer Staging Ductal carcinoma in situ (DCIS) of left breast Staging form: Breast, AJCC 8th Edition - Clinical stage from 05/14/2019: Stage Unknown (cTis (DCIS), cNX, cM0, ER-, PR-, HER2: Not Assessed) - Signed by Truitt Merle, MD on 05/20/2019 - Pathologic stage from 06/09/2019: Stage 0 (pTis (DCIS), pN0, cM0, G3, ER-, PR-, HER2: Not Assessed) - Signed by Alla Feeling, NP on 10/15/2019    05/15/2019 Initial Diagnosis   Ductal carcinoma in situ (DCIS) of left breast   05/27/2019 Breast MRI   Breast MRI 05/27/19  IMPRESSION: 1. 3.9 centimeter area of non mass enhancement in the LOWER OUTER QUADRANT of the LEFT breast, correlating well with the extent of calcifications seen mammographically.  The clip is centrally located within this group of calcifications/non mass enhancement. 2. Small possible satellite nodule anterior to the non mass enhancement measuring 0.6 centimeters. Consider MR guided core biopsy of this lesion to define the anterior extent. If biopsy of this lesion is benign and concordant, I would recommend localization of the entire area of calcifications, to include the anterior and posterior components.   06/09/2019 Pathology Results   Diagnosis 06/09/19 Breast, left, needle core biopsy, LOQ - DUCTAL CARCINOMA IN SITU WITH CALCIFICATIONS AND NECROSIS, SEE COMMENT. Results: IMMUNOHISTOCHEMICAL AND MORPHOMETRIC ANALYSIS PERFORMED MANUALLY Estrogen Receptor: 0%, NEGATIVE Progesterone Receptor: 0%, NEGATIVE   06/09/2019 Cancer Staging   Staging form: Breast, AJCC 8th Edition - Pathologic stage from 06/09/2019: Stage 0 (pTis (DCIS), pN0, cM0, G3, ER-, PR-, HER2: Not Assessed) - Signed by Alla Feeling, NP on 10/15/2019   06/19/2019 Surgery   LEFT MASTECTOMY WITH SENTINEL LYMPH NODE BIOPSY by Dr. Barry Dienes  06/19/19    06/19/2019 Pathology Results   Diagnosis 06/19/19 1. Breast, simple mastectomy, Left - DUCTAL CARCINOMA IN SITU, HIGH GRADE, WITH CALCIFICATIONS AND NECROSIS - MARGINS UNINVOLVED BY CARCINOMA (2.5 CM; POSTERIOR) - PREVIOUS BIOPSY SITE CHANGES - SEE ONCOLOGY TABLE AND COMMENT BELOW 2. Lymph node, sentinel, biopsy, Left #1 - NO CARCINOMA IDENTIFIED IN ONE LYMPH NODE (0/1) 3. Lymph node, sentinel, biopsy, Left #2 - NO CARCINOMA IDENTIFIED IN ONE LYMPH NODE (0/1)   10/15/2019 Survivorship   Per Sally Rue, NP      INTERVAL HISTORY:  Sally Nguyen presents to the Survivorship Clinic today for our initial meeting to review her survivorship care plan detailing her treatment course for breast cancer,  as well as monitoring long-term side effects of that treatment, education regarding health maintenance, screening, and overall wellness and health promotion.      Overall, Sally Nguyen reports feeling well today. She underwent definitive surgery on 06/19/2019. She has been doing PT in the interim to improve skin tightness and ROM. She has some numbness to left chest and axilla. She denies new lump, right nipple inversion/discharge, or skin changes. Denies change in appetite or weight, new bone/joint or abdominal pain. Denies change in bowel habits or GI/GYN bleeding. Feels occasionally hot at night without overt hot flash, night sweats, fever, chills. No cough, chest pain, dyspnea.    ONCOLOGY TREATMENT TEAM:  1. Surgeon:  Dr. Barry [facility]ienes at [facility] 2. Medical Oncologist: Dr. Burr Medico  3. Radiation Oncologist: Dr. Isidore Moos, did not undergo adjuvant radiation due to mastectomy     PAST MEDICAL/SURGICAL HISTORY:  Past Medical History:  Diagnosis Date   Cancer ([place]) 05/2019   left breast DCIS   Seizures ([place])    Vision abnormalities    Past Surgical History:  Procedure Laterality Date   COLONOSCOPY     MASTECTOMY W/ SENTINEL NODE BIOPSY Left 06/19/2019   Procedure: LEFT MASTECTOMY WITH SENTINEL LYMPH NODE BIOPSY;  Surgeon: Stark Klein, MD;  Location: [facility];  Service: General;  Laterality: Left;   WISDOM TOOTH EXTRACTION       ALLERGIES:  Allergies  Allergen Reactions   Penicillins Rash     CURRENT MEDICATIONS:  Outpatient Encounter Medications as of 10/15/2019  Medication Sig Note   acetaminophen (TYLENOL) 500 MG tablet Take 2 tablets (1,000 mg total) by mouth 3 (three) times daily.    Calcium Citrate (CITRACAL PO) Take by mouth. +vit d 04/10/2017: Sts. dose is '630mg'$ , 4 tabs daily   cetirizine (ZYRTEC) 10 MG tablet Take 10 mg by mouth daily.    cholecalciferol (VITAMIN D) 1000 units tablet Take 2,000 Units by mouth daily.    docusate sodium (COLACE) 100 MG capsule Take 1 capsule (100 mg total) by mouth 2 (two) times daily.    gabapentin (NEURONTIN) 100 MG capsule TAKE 1 OR 2 CAPSULES AT BEDTIME      gabapentin (NEURONTIN) 100 MG capsule Take 2 capsules (200 mg total) by mouth 2 (two) times daily.    methocarbamol (ROBAXIN) 500 MG tablet Take 1 tablet (500 mg total) by mouth every 8 (eight) hours as needed for muscle spasms.    Oxcarbazepine (TRILEPTAL) 300 MG tablet Take 1 and 1/2 tablet in the morning and 2 tablets in the evening.    oxyCODONE (OXY IR/ROXICODONE) 5 MG immediate release tablet Take 1-2 tablets (5-10 mg total) by mouth every 6 (six) hours as needed for moderate pain.    UNABLE TO FIND 500 mg. Med Name: Tumeric Curcumin    UNABLE TO FIND Med Name: LivCo    No facility-administered encounter medications on file as of 10/15/2019.      ONCOLOGIC FAMILY HISTORY:  Family History  Problem Relation Age of Onset   Liver cancer Mother    Colon cancer Mother        12   Atrial fibrillation Father    Obesity Sister      GENETIC COUNSELING/TESTING: N/A  SOCIAL HISTORY:  Sally Nguyen lives with a partner who has mobility issues, this somewhat limits her physical exercise. Denies tobacco use.    PHYSICAL EXAMINATION:  Vital Signs:   Vitals:   10/15/19 0940  BP: 111/74  Pulse: 94  Resp:  18  Temp: 98.1 F (36.7 C)  SpO2: 100%   Filed Weights   10/15/19 0940  Weight: 128 lb (58.1 kg)   General: Well-nourished, well-appearing female in no acute distress.   HEENT: Sclerae anicteric.   Lymph: No cervical, supraclavicular, or infraclavicular lymphadenopathy   Cardiovascular: Regular rate and rhythm.Marland Kitchen Respiratory: Clear bilaterally. Breathing non-labored.  GI: Abdomen soft and round; non-tender, non-distended. Bowel sounds normoactive.  Neuro: No focal deficits. Steady gait.  Psych: Mood and affect normal and appropriate for situation.  Extremities: No edema. MSK: No focal spinal tenderness to palpation.  Slight limitation to LUE ROM Skin: Warm and dry. Breast: s/p left mastectomy. Incision completely healed. Very mild LE to left side chest wall.  No palpable mass on the chest wall, right breast, or either axilla that I could appreciate   LABORATORY DATA:  None for this visit.  DIAGNOSTIC IMAGING:  None for this visit.      ASSESSMENT AND PLAN:  Sally Nguyen is a pleasant 63 y.o. female with Stage 0 right breast DCIS,  ER-/PR-, diagnosed in 05/2019, treated with right mastectomy in 06/2019.  She presents to the Survivorship Clinic for our initial meeting and routine follow-up post-completion of treatment for breast cancer.    1. Stage 0 DCIS right breast:  Sally Nguyen is continuing to recover from definitive treatment for breast cancer. She will follow-up with her medical oncologist, Dr. Burr Medico in 07/2020 with history and physical exam per surveillance protocol. Her breast exam today is benign, no concerns. She is not on tamoxifen or AI due to ER/PR - DCIS. Today, a comprehensive survivorship care plan and treatment summary was reviewed with the patient today detailing her breast cancer diagnosis, treatment course, potential late/long-term effects of treatment, appropriate follow-up care with recommendations for the future, and patient education resources.  A copy of this summary, along with a letter will be sent to the patients primary care provider via In Basket message after todays visit.    2. Mild left chest/UE muscle tightness and ROM: Continue PT as planned   3. Bone health:  Given Sally Nguyen age/history she is at risk for bone demineralization.  Her last DEXA scan was 4 years ago, I do not have the report. She thinks she was "borderline" and takes calcium and vitamin D. Will repeat DEXA, I placed the order today.  In the meantime, she was encouraged to continue calcium and vitamin D, as well as increase her weight-bearing activities.  She was given education on specific activities to promote bone health.  4. Cancer screening:  Due to Sally Nguyen's history and her age, she should receive screening for skin cancers, colon cancer,  and gynecologic cancers. She is followed by GI Dr. Collene Mares, last colonoscopy was 2 years ago. Her mother died from colon cancer, so patient is UTD on this. She is also reportedly UTD on PAP screening, will be screened again at next GYN f/u. The information and recommendations were reviewed in detail with the patient.    5. Health maintenance and wellness promotion: Sally Nguyen was encouraged to consume 5-7 servings of fruits and vegetables per day. We reviewed the "Nutrition Rainbow" handout, as well as the handout "Take Control of Your Health and Reduce Your Cancer Risk" from the [employer].  She was also encouraged to engage in moderate to vigorous exercise for 30 minutes per day most days of the week. We discussed the [employer] fitness program, which is designed for cancer survivors to help  them become more physically fit after cancer treatments.  She was instructed to limit her alcohol consumption and continue to abstain from tobacco use.     6. Support services/counseling: It is not uncommon for this period of the patient's cancer care trajectory to be one of many emotions and stressors.  We discussed an opportunity for her to participate in the next session of [facility] ("Finding Your New Normal") support group series designed for patients after they have completed treatment.   Sally Nguyen was encouraged to take advantage of our many other support services programs, support groups, and/or counseling in coping with her new life as a cancer survivor after completing anti-cancer treatment.  She was offered support today through active listening and expressive supportive counseling.  She was given information regarding our available services and encouraged to contact me with any questions or for help enrolling in any of our support group/programs. She recently signed up for virtual [facility]. She denies body image concerns or intimacy issues.    Dispo:   -DEXA due now, may schedule at your  convenience  -screening breast MRI 11/25/2019 -F/u with Dr. Barry Dienes Sally Nguyen or Feb 2021 -Mammogram due 04/2020 -Return to cancer center 07/2020 -She is welcome to return back to the Survivorship Clinic at any time; no additional follow-up needed at this time.  -Consider referral back to survivorship as a long-term survivor for continued surveillance  A total of (30) minutes of face-to-face time was spent with this patient with greater than 50% of that time in counseling and care-coordination.   Sally Rue, NP Survivorship Program [facility] [phone]  Orders Placed This Encounter  Procedures   DG Bone Density    Standing Status:   Future    Standing Expiration Date:   10/14/2020    Order Specific Question:   Reason for Exam (SYMPTOM  OR DIAGNOSIS REQUIRED)    Answer:   postmenopausal, last DEXA 4 years ago    Order Specific Question:   Preferred imaging location?    Answer:   [facility]   Note: PRIMARY CARE PROVIDER [name], [place] [phone]

## 2019-10-15 ENCOUNTER — Inpatient Hospital Stay: Attending: Hematology | Admitting: Nurse Practitioner

## 2019-10-15 ENCOUNTER — Encounter: Payer: Self-pay | Admitting: Nurse Practitioner

## 2019-10-15 ENCOUNTER — Other Ambulatory Visit: Payer: Self-pay

## 2019-10-15 VITALS — BP 111/74 | HR 94 | Temp 98.1°F | Resp 18 | Ht 66.0 in | Wt 128.0 lb

## 2019-10-15 DIAGNOSIS — Z86000 Personal history of in-situ neoplasm of breast: Secondary | ICD-10-CM | POA: Diagnosis present

## 2019-10-15 DIAGNOSIS — E2839 Other primary ovarian failure: Secondary | ICD-10-CM

## 2019-10-15 DIAGNOSIS — Z9012 Acquired absence of left breast and nipple: Secondary | ICD-10-CM | POA: Insufficient documentation

## 2019-10-15 DIAGNOSIS — D0512 Intraductal carcinoma in situ of left breast: Secondary | ICD-10-CM | POA: Diagnosis not present

## 2019-10-15 DIAGNOSIS — Z79899 Other long term (current) drug therapy: Secondary | ICD-10-CM | POA: Diagnosis not present

## 2019-10-15 NOTE — Addendum Note (Signed)
Addended by: Alla Feeling on: 10/15/2019 11:19 AM   Modules accepted: Orders

## 2019-10-16 ENCOUNTER — Telehealth: Payer: Self-pay | Admitting: Nurse Practitioner

## 2019-10-16 NOTE — Telephone Encounter (Signed)
Scheduled appt per 11/11 los.  Sent a staf message to get a calendar mailed out.

## 2019-10-23 ENCOUNTER — Other Ambulatory Visit: Payer: Self-pay

## 2019-10-23 ENCOUNTER — Encounter: Payer: Self-pay | Admitting: Rehabilitation

## 2019-10-23 ENCOUNTER — Ambulatory Visit: Attending: General Surgery | Admitting: Rehabilitation

## 2019-10-23 DIAGNOSIS — R293 Abnormal posture: Secondary | ICD-10-CM

## 2019-10-23 DIAGNOSIS — M25612 Stiffness of left shoulder, not elsewhere classified: Secondary | ICD-10-CM | POA: Diagnosis present

## 2019-10-23 DIAGNOSIS — Z483 Aftercare following surgery for neoplasm: Secondary | ICD-10-CM

## 2019-10-23 NOTE — Therapy (Signed)
[place], [place], [address] Phone: [phone]   Fax:  [phone]  Physical Therapy Treatment  Patient Details  Name: Sally Nguyen MRN: [id number] Date of Birth: September 22, 1956 Referring Provider (PT): Dr. Barry Dienes   Encounter Date: 10/23/2019  PT End of Session - 10/23/19 1714    Visit Number  16    Number of Visits  18    Date for PT Re-Evaluation  12/04/19    PT Start Time  1503    PT Stop Time  1556    PT Time Calculation (min)  53 min    Activity Tolerance  Patient tolerated treatment well    Behavior During Therapy  [facility] for tasks assessed/performed       Past Medical History:  Diagnosis Date  . Cancer ([place]) 05/2019   left breast DCIS  . Seizures ([place])   . Vision abnormalities     Past Surgical History:  Procedure Laterality Date  . COLONOSCOPY    . MASTECTOMY W/ SENTINEL NODE BIOPSY Left 06/19/2019   Procedure: LEFT MASTECTOMY WITH SENTINEL LYMPH NODE BIOPSY;  Surgeon: Sally Klein, MD;  Location: [facility];  Service: General;  Laterality: Left;  . WISDOM TOOTH EXTRACTION      There were no vitals filed for this visit.  Subjective Assessment - 10/23/19 1504    Subjective  The Right side is still numb, I have been doing well.  I stopped exercising 1.5-2 weeks and I can tell that it gets worse.  Getting random soreness lateral chest .  I went to survivorship and had my first [facility]. I have been doing the weights 2x per week and everyday the stretches.    Pertinent History  Lt mastectomy and SLNB on 06/19/19 by Dr. Barry Dienes with 2 nodes removed. ER/PR negative HER2 positive.  History of seizures. No other treatments needed    Patient Stated Goals  get back to normal    Currently in Pain?  No/denies         [facility] PT Assessment - 10/23/19 0001      AROM   Left Shoulder Extension  62 Degrees    Left Shoulder Flexion  150 Degrees   anterior chest tightness   Left  Shoulder ABduction  156 Degrees    Left Shoulder Internal Rotation  15 Degrees    Left Shoulder External Rotation  98 Degrees    Left Shoulder Horizontal ABduction  13 Degrees      Strength   Overall Strength Comments  global 4+/5 in standing , prone MT/LT/UT all 4/5 on the left                   [facility] Adult PT Treatment/Exercise - 10/23/19 0001      Manual Therapy   Manual Therapy  Edema management    Edema Management  pt with concerns that the puffiness in the lateral chest is lymphedema vs my new normal.  Lengthy discussion about how if it was lymphedema we would still continue with the compression bras, use of foam, and self MLD.  Pt will be fitted for her prosthesis 11/30 so PT called [employer] to [employer] to add compression bra to the fitting.  Pt will get a more compressive bra than her current sports bra and start using the foam again.  pt is already aware of self MLD.  Puffiness here is mild and has not changed since beginning of treatment.  Soft tissue mobilization  to the left pectoralis and latissimus in netural and flexed shoulder position with visible release    Passive ROM  to the left shoulder into all planes including D2 and ER at various angles.                  PT Long Term Goals - 10/23/19 1717      PT LONG TERM GOAL #1   Title  Pt will improve lt shoulder abduction to 155 or greater    Baseline  92; 07/30/19:132, 9/28:137,  09/18/19;  152 on 10/15, 150 on 11/19    Status  On-going      PT LONG TERM GOAL #2   Title  Pt will improve Lt horizontal abduction to 30deg or greater    Baseline  -30, 8/26: 5deg, 9/28: 15deg, 10/15: 30, 11/19; 15    Status  Partially Met      PT LONG TERM GOAL #3   Title  Pt will return to all daily activities without limitations - including irish drumming    Status  Achieved      PT LONG TERM GOAL #4   Title  Pt will be knowledgeable about lmyphedema and risk reduction    Status  Achieved            Plan  - 10/23/19 1714    Clinical Impression Statement  Pt returns to PT after about 1 month break.  Overall she has been able to maintain her ROM except for horizontal abduction which has decreased from 30 back to 15.  Pt reports she tried not to do any stretches or exercises the past 3 weeks to see if it would get worse.  This is promising assuming it would have improved or stayed the same with continued exercise.  Pt does continue with slight puffiness left lateral chest but unchanged since evaluation we reviewed treatment if it is lymphedema today with pt agreeable to trying a different bra now.  Due to apprehension we will continue 2 more visits before independence.    PT Frequency  Biweekly    PT Duration  4 weeks    PT Treatment/Interventions  ADLs/Self Care Home Management;Manual lymph drainage;Manual techniques;Therapeutic exercise;Therapeutic activities;Patient/family education    PT Next Visit Plan  STM for the left chest and Lt shoulder ROM work and exercise, new bra? new foam?    PT Home Exercise Plan  Access Code: [email or web address] and strength ABC    Consulted and Agree with Plan of Care  Patient       Patient will benefit from skilled therapeutic intervention in order to improve the following deficits and impairments:  Decreased skin integrity, Increased fascial restricitons, Decreased scar mobility, Pain, Decreased range of motion, Impaired UE functional use  Visit Diagnosis: Stiffness of left shoulder, not elsewhere classified  Abnormal posture  Aftercare following surgery for neoplasm     Problem List Patient Active Problem List   Diagnosis Date Noted  . Breast cancer, stage 0, left 06/19/2019  . Ductal carcinoma in situ (DCIS) of left breast 05/15/2019  . Insomnia 05/09/2018  . 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 Partial symptomatic epilepsy with complex partial seizures, not intractable, without status epilepticus ([place]) 04/10/2017    Sally Nguyen 10/23/2019, 5:19  PM  [place], [place], [address] Phone: [phone]   Fax:  [phone]  Name: Sally Nguyen MRN: [id number] Date of Birth: 1956/10/01

## 2019-11-13 ENCOUNTER — Ambulatory Visit: Attending: General Surgery | Admitting: Rehabilitation

## 2019-11-13 ENCOUNTER — Other Ambulatory Visit: Payer: Self-pay

## 2019-11-13 ENCOUNTER — Encounter: Payer: Self-pay | Admitting: Rehabilitation

## 2019-11-13 DIAGNOSIS — R293 Abnormal posture: Secondary | ICD-10-CM | POA: Diagnosis present

## 2019-11-13 DIAGNOSIS — Z483 Aftercare following surgery for neoplasm: Secondary | ICD-10-CM | POA: Insufficient documentation

## 2019-11-13 DIAGNOSIS — M25612 Stiffness of left shoulder, not elsewhere classified: Secondary | ICD-10-CM | POA: Insufficient documentation

## 2019-11-13 NOTE — Therapy (Signed)
[place], [place], [address] Phone: [phone]   Fax:  [phone]  Physical Therapy Treatment  Patient Details  Name: Sally Nguyen MRN: [id number] Date of Birth: 08-Nov-1956 Referring Provider (PT): Dr. Barry Dienes   Encounter Date: 11/13/2019  PT End of Session - 11/13/19 1414    Visit Number  17    Number of Visits  18    Date for PT Re-Evaluation  12/04/19    PT Start Time  1302    PT Stop Time  1356    PT Time Calculation (min)  54 min    Activity Tolerance  Patient tolerated treatment well    Behavior During Therapy  [facility] for tasks assessed/performed       Past Medical History:  Diagnosis Date  . Cancer ([place]) 05/2019   left breast DCIS  . Seizures ([place])   . Vision abnormalities     Past Surgical History:  Procedure Laterality Date  . COLONOSCOPY    . MASTECTOMY W/ SENTINEL NODE BIOPSY Left 06/19/2019   Procedure: LEFT MASTECTOMY WITH SENTINEL LYMPH NODE BIOPSY;  Surgeon: Stark Klein, MD;  Location: [facility];  Service: General;  Laterality: Left;  . WISDOM TOOTH EXTRACTION      There were no vitals filed for this visit.  Subjective Assessment - 11/13/19 1305    Subjective  I got a new bra and an insert and don't think it really helps with the puffiness. I don't feel really tight    Pertinent History  Lt mastectomy and SLNB on 06/19/19 by Dr. Barry Dienes with 2 nodes removed. ER/PR negative HER2 positive.  History of seizures. No other treatments needed    Currently in Pain?  No/denies         [facility] PT Assessment - 11/13/19 0001      AROM   Left Shoulder Flexion  151 Degrees   anterior chest tightness   Left Shoulder ABduction  155 Degrees    Left Shoulder Internal Rotation  40 Degrees    Left Shoulder External Rotation  98 Degrees    Left Shoulder Horizontal ABduction  20 Degrees                   [facility] Adult PT Treatment/Exercise - 11/13/19 0001       Manual Therapy   Soft tissue mobilization  to the left pectoralis and latissimus in netural and flexed shoulder position with visible release    Myofascial Release  Cross hand myofascial release across the Lt anterior chest wall w/an emphasis over scar tissue and to axilla at end P/ROM.     Manual Lymphatic Drainage (MLD)  short neck, Rt axillary nodes and Lt inguinal nodes, anterior interaxillary pathway, left axillo inguinal pathway, then Lt axillary nodes and then focus on left lateral chest and inferior to the incision working towards all 3 node beds.       Passive ROM  to the left shoulder into all planes including D2 and ER at various angles.                  PT Long Term Goals - 10/23/19 1717      PT LONG TERM GOAL #1   Title  Pt will improve lt shoulder abduction to 155 or greater    Baseline  92; 07/30/19:132, 9/28:137,  09/18/19;  152 on 10/15, 150 on 11/19    Status  On-going      PT LONG  TERM GOAL #2   Title  Pt will improve Lt horizontal abduction to 30deg or greater    Baseline  -30, 8/26: 5deg, 9/28: 15deg, 10/15: 30, 11/19; 15    Status  Partially Met      PT LONG TERM GOAL #3   Title  Pt will return to all daily activities without limitations - including irish drumming    Status  Achieved      PT LONG TERM GOAL #4   Title  Pt will be knowledgeable about lmyphedema and risk reduction    Status  Achieved            Plan - 11/13/19 1414    Clinical Impression Statement  Pt with some slight increased puffiness inferior and lateral to her incision since last visit with new bra and prosthesis.  AROM remains consistent and still with some pectoralis region pulling with end range.  Focused on STM and MLD today and pt will try to not wear her prosthesis with just bra and foam to see if that helps or no compression bra to see if that helps.  Overall pt is able to remain consistent with 2 week break    PT Frequency  Biweekly    PT Duration  4 weeks    PT  Treatment/Interventions  ADLs/Self Care Home Management;Manual lymph drainage;Manual techniques;Therapeutic exercise;Therapeutic activities;Patient/family education    PT Home Exercise Plan  Access Code: [email or web address] and strength [email or web address]    Consulted and Agree with Plan of Care  Patient       Patient will benefit from skilled therapeutic intervention in order to improve the following deficits and impairments:     Visit Diagnosis: Stiffness of left shoulder, not elsewhere classified  Abnormal posture  Aftercare following surgery for neoplasm     Problem List Patient Active Problem List   Diagnosis Date Noted  . Breast cancer, stage 0, left 06/19/2019  . Ductal carcinoma in situ (DCIS) of left breast 05/15/2019  . Insomnia 05/09/2018  . 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 Partial symptomatic epilepsy with complex partial seizures, not intractable, without status epilepticus ([place]) 04/10/2017    Stark Bray 11/13/2019, 2:17 PM  [place], [place], [address] Phone: [phone]   Fax:  [phone]  Name: Sally Nguyen MRN: [id number] Date of Birth: 03/24/1956

## 2019-11-25 ENCOUNTER — Other Ambulatory Visit: Payer: Self-pay

## 2019-11-25 ENCOUNTER — Ambulatory Visit
Admission: RE | Admit: 2019-11-25 | Discharge: 2019-11-25 | Disposition: A | Discharge status: Home / Self Care | Source: Ambulatory Visit | Attending: Hematology | Admitting: Hematology

## 2019-11-25 ENCOUNTER — Other Ambulatory Visit

## 2019-11-25 DIAGNOSIS — Z1231 Encounter for screening mammogram for malignant neoplasm of breast: Secondary | ICD-10-CM

## 2019-11-25 MED ORDER — GADOBENATE DIMEGLUMINE 529 MG/ML IV SOLN
6.0000 mL | Freq: Once | INTRAVENOUS | Status: AC | PRN
  Administered 2019-11-25: 6 mL via INTRAVENOUS

## 2019-12-01 ENCOUNTER — Encounter: Payer: Self-pay | Admitting: Rehabilitation

## 2019-12-01 ENCOUNTER — Other Ambulatory Visit: Payer: Self-pay

## 2019-12-01 ENCOUNTER — Ambulatory Visit
Admission: RE | Admit: 2019-12-01 | Discharge: 2019-12-01 | Disposition: A | Discharge status: Home / Self Care | Source: Ambulatory Visit | Attending: Nurse Practitioner | Admitting: Nurse Practitioner

## 2019-12-01 ENCOUNTER — Ambulatory Visit: Admitting: Rehabilitation

## 2019-12-01 DIAGNOSIS — M25612 Stiffness of left shoulder, not elsewhere classified: Secondary | ICD-10-CM

## 2019-12-01 DIAGNOSIS — R293 Abnormal posture: Secondary | ICD-10-CM

## 2019-12-01 DIAGNOSIS — Z483 Aftercare following surgery for neoplasm: Secondary | ICD-10-CM

## 2019-12-01 DIAGNOSIS — E2839 Other primary ovarian failure: Secondary | ICD-10-CM

## 2019-12-01 NOTE — Therapy (Signed)
[place], [place], [address] Phone: [phone]   Fax:  [phone]  Physical Therapy Treatment  Patient Details  Name: Sally Nguyen MRN: [id number] Date of Birth: 05/20/1956 Referring Provider (PT): Dr. Barry Dienes   Encounter Date: 12/01/2019  PT End of Session - 12/01/19 1345    Visit Number  18    Number of Visits  18    Date for PT Re-Evaluation  12/04/19    PT Start Time  1302    PT Stop Time  1345    PT Time Calculation (min)  43 min    Activity Tolerance  Patient tolerated treatment well    Behavior During Therapy  [facility] for tasks assessed/performed       Past Medical History:  Diagnosis Date  . Cancer ([place]) 05/2019   left breast DCIS  . Seizures ([place])   . Vision abnormalities     Past Surgical History:  Procedure Laterality Date  . COLONOSCOPY    . MASTECTOMY W/ SENTINEL NODE BIOPSY Left 06/19/2019   Procedure: LEFT MASTECTOMY WITH SENTINEL LYMPH NODE BIOPSY;  Surgeon: Stark Klein, MD;  Location: [place];  Service: General;  Laterality: Left;  . WISDOM TOOTH EXTRACTION      There were no vitals filed for this visit.  Subjective Assessment - 12/01/19 1304    Subjective  My husband is really getting confused at home so therapy is coming to an end at a good time. I just have that real tightness.  I feel good during the day and at night but when I wake up it is stiff again. MRI was clear and had bone scan today. I am ready to be done.    Pertinent History  Lt mastectomy and SLNB on 06/19/19 by Dr. Barry Dienes with 2 nodes removed. ER/PR negative HER2 positive.  History of seizures. No other treatments needed    Patient Stated Goals  get back to normal    Currently in Pain?  No/denies         [facility] PT Assessment - 12/01/19 0001      Assessment   Medical Diagnosis  Lt mastectomy    Referring Provider (PT)  Dr. Barry Dienes      Observation/Other Assessments   Observations  mild puffiness remains lateral trunk but unchanged overall.        AROM   Left Shoulder Flexion  152 Degrees   pull   Left Shoulder ABduction  151 Degrees    Left Shoulder Internal Rotation  --   behind the back full   Left Shoulder External Rotation  110 Degrees    Left Shoulder Horizontal ABduction  20 Degrees      Strength   Overall Strength Comments  global 4+/5 in standing , prone MT/LT/UT all 4/5 on the left                   [facility] Adult PT Treatment/Exercise - 12/01/19 0001      Manual Therapy   Soft tissue mobilization  to the left pectoralis  in netural and flexed shoulder position with visible release focus on tight band from the end of th eincision to the axilla    Myofascial Release  Cross hand myofascial release across the Lt anterior chest wall w/an emphasis over scar tissue and to axilla at end P/ROM.     Manual Lymphatic Drainage (MLD)  short neck, Rt axillary nodes and Lt inguinal nodes, anterior interaxillary pathway,  left axillo inguinal pathway, then Lt axillary nodes and then focus on left lateral chest and inferior to the incision working towards all 3 node beds.       Passive ROM  to the left shoulder into all planes including D2 and ER at various angles.                  PT Long Term Goals - 12/01/19 1313      PT LONG TERM GOAL #1   Title  Pt will improve lt shoulder abduction to 155 or greater    Baseline  92; 07/30/19:132, 9/28:137,  09/18/19;  152 on 10/15, 150 on 11/19, 155 12/28    Status  Achieved      PT LONG TERM GOAL #2   Title  Pt will improve Lt horizontal abduction to 30deg or greater    Baseline  -30, 8/26: 5deg, 9/28: 15deg, 10/15: 30, 11/19; 15, 20 on 12/01/19    Status  Partially Met      PT LONG TERM GOAL #3   Title  Pt will return to all daily activities without limitations - including irish drumming    Status  Achieved      PT LONG TERM GOAL #4   Title  Pt will be knowledgeable about lmyphedema  and risk reduction    Status  Achieved            Plan - 12/01/19 1346    Clinical Impression Statement  Pt is 18 visits out from PT eval with plateau in left shoulder ROM and mild puffiness in the left chest wall.  Overall pt has improved tremendously and is more comfortable with her post surgical status without return to "normal".  Pt has met all goals except for horizontal abduction but partially met.  Will DC today.       Patient will benefit from skilled therapeutic intervention in order to improve the following deficits and impairments:     Visit Diagnosis: Stiffness of left shoulder, not elsewhere classified  Abnormal posture  Aftercare following surgery for neoplasm     Problem List Patient Active Problem List   Diagnosis Date Noted  . Breast cancer, stage 0, left 06/19/2019  . Ductal carcinoma in situ (DCIS) of left breast 05/15/2019  . Insomnia 05/09/2018  . 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 Partial symptomatic epilepsy with complex partial seizures, not intractable, without status epilepticus ([place]) 04/10/2017    Stark Bray 12/01/2019, 1:48 PM  [place], [place], [address] Phone: [phone]   Fax:  [phone]  Name: Sally Nguyen MRN: [id number] Date of Birth: [birth year]  PHYSICAL THERAPY DISCHARGE SUMMARY  Visits from Start of Care: 18  Current functional level related to goals / functional outcomes: See above   Remaining deficits: See above   Education / Equipment: Strength ABC, compression bra, foam for the bra, stretches Plan: Patient agrees to discharge.  Patient goals were partially met. Patient is being discharged due to lack of progress.  ?????     Shan Levans, PT

## 2019-12-02 ENCOUNTER — Telehealth: Payer: Self-pay | Admitting: Nurse Practitioner

## 2019-12-02 NOTE — Telephone Encounter (Signed)
I called to review DEXA which shows T-score -1.6 at femur neck, she is considered osteopenic. I recommend to take calcium and vitamin D daily and engage in weight bearing exercise. She had been off calcium for approx 6 months. We discussed doses and suggested exercises. She understands and appreciates the call.  [name], NP  12/02/2019

## 2019-12-04 ENCOUNTER — Encounter: Payer: Self-pay | Admitting: *Deleted

## 2020-02-18 ENCOUNTER — Encounter: Admitting: Nurse Practitioner

## 2020-02-19 ENCOUNTER — Ambulatory Visit (INDEPENDENT_AMBULATORY_CARE_PROVIDER_SITE_OTHER): Admitting: Nurse Practitioner

## 2020-02-19 ENCOUNTER — Other Ambulatory Visit: Payer: Self-pay

## 2020-02-19 ENCOUNTER — Encounter: Payer: Self-pay | Admitting: Nurse Practitioner

## 2020-02-19 VITALS — BP 110/78 | HR 96 | Temp 98.3°F | Ht 66.0 in | Wt 127.8 lb

## 2020-02-19 DIAGNOSIS — D0512 Intraductal carcinoma in situ of left breast: Secondary | ICD-10-CM

## 2020-02-19 DIAGNOSIS — R7309 Other abnormal glucose: Secondary | ICD-10-CM | POA: Diagnosis not present

## 2020-02-19 DIAGNOSIS — G4089 Other seizures: Secondary | ICD-10-CM

## 2020-02-19 DIAGNOSIS — R21 Rash and other nonspecific skin eruption: Secondary | ICD-10-CM

## 2020-02-19 DIAGNOSIS — Z1159 Encounter for screening for other viral diseases: Secondary | ICD-10-CM

## 2020-02-19 DIAGNOSIS — Z9012 Acquired absence of left breast and nipple: Secondary | ICD-10-CM | POA: Diagnosis not present

## 2020-02-19 DIAGNOSIS — Z0000 Encounter for general adult medical examination without abnormal findings: Secondary | ICD-10-CM | POA: Diagnosis not present

## 2020-02-19 LAB — POCT URINALYSIS DIPSTICK
Bilirubin, UA: NEGATIVE
Glucose, UA: NEGATIVE
Ketones, UA: NEGATIVE
Leukocytes, UA: NEGATIVE
Nitrite, UA: NEGATIVE
Protein, UA: NEGATIVE
Spec Grav, UA: 1.025 (ref 1.010–1.025)
Urobilinogen, UA: 0.2 E.U./dL
pH, UA: 5.5 (ref 5.0–8.0)

## 2020-02-19 MED ORDER — VALACYCLOVIR HCL 500 MG PO TABS: 500.0000 mg | ORAL_TABLET | Freq: Two times a day (BID) | ORAL | 0 refills | Status: DC

## 2020-02-19 MED ORDER — SHINGRIX 50 MCG/0.5ML IM SUSR: 0.5000 mL | Freq: Once | INTRAMUSCULAR | 0 refills | Status: AC

## 2020-02-19 NOTE — Patient Instructions (Signed)
Health Maintenance, Female Adopting a healthy lifestyle and getting preventive care are important in promoting health and wellness. Ask your health care provider about:  The right schedule for you to have regular tests and exams.  Things you can do on your own to prevent diseases and keep yourself healthy. What should I know about diet, weight, and exercise? Eat a healthy diet   Eat a diet that includes plenty of vegetables, fruits, low-fat dairy products, and lean protein.  Do not eat a lot of foods that are high in solid fats, added sugars, or sodium. Maintain a healthy weight Body mass index (BMI) is used to identify weight problems. It estimates body fat based on height and weight. Your health care provider can help determine your BMI and help you achieve or maintain a healthy weight. Get regular exercise Get regular exercise. This is one of the most important things you can do for your health. Most adults should:  Exercise for at least 150 minutes each week. The exercise should increase your heart rate and make you sweat (moderate-intensity exercise).  Do strengthening exercises at least twice a week. This is in addition to the moderate-intensity exercise.  Spend less time sitting. Even light physical activity can be beneficial. Watch cholesterol and blood lipids Have your blood tested for lipids and cholesterol at 63 years of age, then have this test every 5 years. Have your cholesterol levels checked more often if:  Your lipid or cholesterol levels are high.  You are older than 63 years of age.  You are at high risk for heart disease. What should I know about cancer screening? Depending on your health history and family history, you may need to have cancer screening at various ages. This may include screening for:  Breast cancer.  Cervical cancer.  Colorectal cancer.  Skin cancer.  Lung cancer. What should I know about heart disease, diabetes, and high blood  pressure? Blood pressure and heart disease  High blood pressure causes heart disease and increases the risk of stroke. This is more likely to develop in people who have high blood pressure readings, are of African descent, or are overweight.  Have your blood pressure checked: ? Every 3-5 years if you are 18-39 years of age. ? Every year if you are 40 years old or older. Diabetes Have regular diabetes screenings. This checks your fasting blood sugar level. Have the screening done:  Once every three years after age 40 if you are at a normal weight and have a low risk for diabetes.  More often and at a younger age if you are overweight or have a high risk for diabetes. What should I know about preventing infection? Hepatitis B If you have a higher risk for hepatitis B, you should be screened for this virus. Talk with your health care provider to find out if you are at risk for hepatitis B infection. Hepatitis C Testing is recommended for:  Everyone born from 1945 through 1965.  Anyone with known risk factors for hepatitis C. Sexually transmitted infections (STIs)  Get screened for STIs, including gonorrhea and chlamydia, if: ? You are sexually active and are younger than 63 years of age. ? You are older than 63 years of age and your health care provider tells you that you are at risk for this type of infection. ? Your sexual activity has changed since you were last screened, and you are at increased risk for chlamydia or gonorrhea. Ask your health care provider if   you are at risk.  Ask your health care provider about whether you are at high risk for HIV. Your health care provider may recommend a prescription medicine to help prevent HIV infection. If you choose to take medicine to prevent HIV, you should first get tested for HIV. You should then be tested every 3 months for as long as you are taking the medicine. Pregnancy  If you are about to stop having your period (premenopausal) and  you may become pregnant, seek counseling before you get pregnant.  Take 400 to 800 micrograms (mcg) of folic acid every day if you become pregnant.  Ask for birth control (contraception) if you want to prevent pregnancy. Osteoporosis and menopause Osteoporosis is a disease in which the bones lose minerals and strength with aging. This can result in bone fractures. If you are 65 years old or older, or if you are at risk for osteoporosis and fractures, ask your health care provider if you should:  Be screened for bone loss.  Take a calcium or vitamin D supplement to lower your risk of fractures.  Be given hormone replacement therapy (HRT) to treat symptoms of menopause. Follow these instructions at home: Lifestyle  Do not use any products that contain nicotine or tobacco, such as cigarettes, e-cigarettes, and chewing tobacco. If you need help quitting, ask your health care provider.  Do not use street drugs.  Do not share needles.  Ask your health care provider for help if you need support or information about quitting drugs. Alcohol use  Do not drink alcohol if: ? Your health care provider tells you not to drink. ? You are pregnant, may be pregnant, or are planning to become pregnant.  If you drink alcohol: ? Limit how much you use to 0-1 drink a day. ? Limit intake if you are breastfeeding.  Be aware of how much alcohol is in your drink. In the [place] one drink equals one 12 oz bottle of beer (355 mL), one 5 oz glass of wine (148 mL), or one 1 oz glass of hard liquor (44 mL). General instructions  Schedule regular health, dental, and eye exams.  Stay current with your vaccines.  Tell your health care provider if: ? You often feel depressed. ? You have ever been abused or do not feel safe at home. Summary  Adopting a healthy lifestyle and getting preventive care are important in promoting health and wellness.  Follow your health care provider's instructions about healthy  diet, exercising, and getting tested or screened for diseases.  Follow your health care provider's instructions on monitoring your cholesterol and blood pressure. This information is not intended to replace advice given to you by your health care provider. Make sure you discuss any questions you have with your health care provider. Document Revised: 11/13/2018 Document Reviewed: 11/13/2018 Elsevier Patient Education  2020 Elsevier Inc.  

## 2020-02-19 NOTE — Progress Notes (Signed)
Subjective:     Patient ID: Sally Nguyen , female    DOB: 10-21-1956 , 64 y.o.   MRN: [id number]   Chief Complaint  Patient presents with  . Annual Exam   The patient states she uses post menopausal status for birth control. Last LMP was No LMP recorded. Patient is postmenopausal.. Negative for Dysmenorrhea and Negative for Menorrhagia Mammogram last done 2019.   Negative for: breast discharge, breast lump(s), breast pain and breast self exam.  Pertinent negatives include abnormal bleeding (hematology), anxiety, decreased libido, depression, difficulty falling sleep, dyspareunia, history of infertility, nocturia, sexual dysfunction, sleep disturbances, urinary incontinence, urinary urgency, vaginal discharge and vaginal itching. Diet regular.The patient states her exercise level is  minimal   The patient's tobacco use is:   Social History   Tobacco Use  Smoking Status Never Smoker  Smokeless Tobacco Never Used   She has been exposed to passive smoke. The patient's alcohol use is:  Social History   Substance and Sexual Activity  Alcohol Use No   Additional information: Last pap 01/2017, next one scheduled for today.   HPI  Here for HM  She had to have a mastectomy to the left breast. She was released in November. She is not taking any Tamoxifen.     Past Medical History:  Diagnosis Date  . Cancer ([place]) 05/2019   left breast DCIS  . Seizures ([place])   . Vision abnormalities      Family History  Problem Relation Age of Onset  . Liver cancer Mother   . Colon cancer Mother        81  . Atrial fibrillation Father   . Obesity Sister      Current Outpatient Medications:  .  Calcium Citrate (CITRACAL PO), Take by mouth. +vit d, Disp: , Rfl:  .  cetirizine (ZYRTEC) 10 MG tablet, Take 10 mg by mouth daily., Disp: , Rfl:  .  cholecalciferol (VITAMIN D) 1000 units tablet, Take 2,000 Units by mouth daily., Disp: , Rfl:  .  gabapentin (NEURONTIN) 100 MG capsule, TAKE 1 OR 2  CAPSULES AT BEDTIME, Disp: 180 capsule, Rfl: 4 .  Oxcarbazepine (TRILEPTAL) 300 MG tablet, Take 1 and 1/2 tablet in the morning and 2 tablets in the evening., Disp: 360 tablet, Rfl: 4 .  UNABLE TO FIND, 500 mg. Med Name: Tumeric Curcumin, Disp: , Rfl:  .  UNABLE TO FIND, Med Name: LivCo, Disp: , Rfl:    Allergies  Allergen Reactions  . Penicillins Rash     Review of Systems  Constitutional: Negative.   HENT: Negative.   Eyes: Negative.   Respiratory: Negative.   Cardiovascular: Negative.   Gastrointestinal: Negative.   Endocrine: Negative.   Genitourinary: Negative.   Musculoskeletal: Negative.   Skin: Positive for rash (left side abdomen and back).       Mastectomy left breast  Allergic/Immunologic: Negative.   Neurological: Negative.  Negative for dizziness and headaches.  Hematological: Negative.   Psychiatric/Behavioral: Negative.      Today's Vitals   02/19/20 1106  BP: 110/78  Pulse: 96  Temp: 98.3 F (36.8 C)  TempSrc: Oral  Weight: 127 lb 12.8 oz (58 kg)  Height: 5' 6" (1.676 m)  PainSc: 0-No pain   Body mass index is 20.63 kg/m.   Objective:  Physical Exam Vitals reviewed.  Constitutional:      General: She is not in acute distress.    Appearance: Normal appearance. She is well-developed.  HENT:  Head: Normocephalic and atraumatic.     Right Ear: Hearing, tympanic membrane, ear canal and external ear normal.     Left Ear: Hearing, tympanic membrane, ear canal and external ear normal.     Nose: Nose normal.     Mouth/Throat:     Mouth: Mucous membranes are moist.  Eyes:     General: Lids are normal.        Right eye: No discharge.        Left eye: No discharge.     Extraocular Movements: Extraocular movements intact.     Conjunctiva/sclera: Conjunctivae normal.     Pupils: Pupils are equal, round, and reactive to light.     Funduscopic exam:    Right eye: No papilledema.        Left eye: No papilledema.  Neck:     Thyroid: No thyroid mass.      Vascular: No carotid bruit.  Cardiovascular:     Rate and Rhythm: Normal rate and regular rhythm.     Pulses: Normal pulses.     Heart sounds: Normal heart sounds. No murmur.  Pulmonary:     Effort: Pulmonary effort is normal.     Breath sounds: Normal breath sounds.  Chest:     Chest wall: No mass.     Breasts:        Right: Normal. No mass, nipple discharge or tenderness.        Left: Absent.     Comments: Healed scar to left breast from mastectomy Abdominal:     General: Abdomen is flat. Bowel sounds are normal.     Palpations: Abdomen is soft.  Genitourinary:    Pubic Area: No rash.      Labia:        Right: No rash or tenderness.        Left: No rash or tenderness.   Musculoskeletal:        General: No swelling. Normal range of motion.     Cervical back: Full passive range of motion without pain, normal range of motion and neck supple.     Right lower leg: No edema.     Left lower leg: No edema.  Lymphadenopathy:     Upper Body:     Right upper body: No supraclavicular adenopathy.     Left upper body: No supraclavicular adenopathy.  Skin:    General: Skin is warm and dry.     Capillary Refill: Capillary refill takes less than 2 seconds.  Neurological:     General: No focal deficit present.     Mental Status: She is alert and oriented to person, place, and time.     Cranial Nerves: No cranial nerve deficit.     Sensory: No sensory deficit.  Psychiatric:        Mood and Affect: Mood normal.        Behavior: Behavior normal.        Thought Content: Thought content normal.        Judgment: Judgment normal.         Assessment And Plan:     1. Routine physical examination . Behavior modifications discussed and diet history reviewed.   . Pt will continue to exercise regularly and modify diet with low GI, plant based foods and decrease intake of processed foods.  . Recommend intake of daily multivitamin, Vitamin D, and calcium.  . Recommend mammogram (up to  date) for preventive screenings, as well as recommend immunizations that include  Shingles (she is to go in one month to allow current rash to heal) - CMP14+EGFR - Lipid panel - CBC  2. Abnormal glucose  Chronic, slightly elevated   No current medications - Hemoglobin A1c  3. Ductal carcinoma in situ (DCIS) of left breast  No radiation or chemo, she had a left breast mastectomy  Overall doing well  Being followed by Oncology  4. H/O left mastectomy   5. Encounter for hepatitis C screening test for low risk patient  Will check for Hepatitis C screening due to being born between the years [birth year]-1965 - Hepatitis C antibody  6. Other seizures ([place])  Chronic, stable   No reports of seizures  Being followed by Neurology  7. Rash and nonspecific skin eruption  She has a clustered rash to back and several small clusters to left chest  This may be shingles will treat with valacyclovir and she is to wait on her shingles vaccine.        [name], FNP

## 2020-02-20 LAB — CMP14+EGFR
ALT: 10 IU/L (ref 0–32)
AST: 16 IU/L (ref 0–40)
Albumin/Globulin Ratio: 1.5 (ref 1.2–2.2)
Albumin: 4.6 g/dL (ref 3.8–4.8)
Alkaline Phosphatase: 72 IU/L (ref 39–117)
BUN/Creatinine Ratio: 15 (ref 12–28)
BUN: 14 mg/dL (ref 8–27)
Bilirubin Total: <0.2 mg/dL (ref 0.0–1.2)
CO2: 25 mmol/L (ref 20–29)
Calcium: 10.1 mg/dL (ref 8.7–10.3)
Chloride: 103 mmol/L (ref 96–106)
Creatinine, Ser: 0.94 mg/dL (ref 0.57–1.00)
GFR calc Af Amer: 75 mL/min/{1.73_m2} (ref >59)
GFR calc non Af Amer: 65 mL/min/{1.73_m2} (ref >59)
Globulin, Total: 3 g/dL (ref 1.5–4.5)
Glucose: 79 mg/dL (ref 65–99)
Potassium: 4.3 mmol/L (ref 3.5–5.2)
Sodium: 142 mmol/L (ref 134–144)
Total Protein: 7.6 g/dL (ref 6.0–8.5)

## 2020-02-20 LAB — LIPID PANEL
Chol/HDL Ratio: 3 ratio (ref 0.0–4.4)
Cholesterol, Total: 192 mg/dL (ref 100–199)
HDL: 64 mg/dL (ref >39)
LDL Chol Calc (NIH): 110 mg/dL — ABNORMAL HIGH (ref 0–99)
Triglycerides: 103 mg/dL (ref 0–149)
VLDL Cholesterol Cal: 18 mg/dL (ref 5–40)

## 2020-02-20 LAB — CBC
Hematocrit: 43.5 % (ref 34.0–46.6)
Hemoglobin: 14.3 g/dL (ref 11.1–15.9)
MCH: 28.6 pg (ref 26.6–33.0)
MCHC: 32.9 g/dL (ref 31.5–35.7)
MCV: 87 fL (ref 79–97)
Platelets: 308 10*3/uL (ref 150–450)
RBC: 5 x10E6/uL (ref 3.77–5.28)
RDW: 13.1 % (ref 11.7–15.4)
WBC: 5.2 10*3/uL (ref 3.4–10.8)

## 2020-02-20 LAB — HEMOGLOBIN A1C
Est. average glucose Bld gHb Est-mCnc: 123 mg/dL
Hgb A1c MFr Bld: 5.9 % — ABNORMAL HIGH (ref 4.8–5.6)

## 2020-02-20 LAB — HEPATITIS C ANTIBODY: Hep C Virus Ab: <0.1 s/co ratio (ref 0.0–0.9)

## 2020-02-23 ENCOUNTER — Other Ambulatory Visit: Payer: Self-pay

## 2020-02-23 MED ORDER — VALACYCLOVIR HCL 500 MG PO TABS: 500.0000 mg | ORAL_TABLET | Freq: Two times a day (BID) | ORAL | 0 refills | Status: DC

## 2020-03-18 ENCOUNTER — Other Ambulatory Visit: Payer: Self-pay | Admitting: Nurse Practitioner

## 2020-03-21 IMAGING — MR MR BILATERAL BREAST WITHOUT AND WITH CONTRAST
6 of 13 series · 19 of 48 positions shown · IV contrast (Yes)
Comparison: 05/14/2027 and earlier

CLINICAL DATA: Recent diagnosis of ductal carcinoma in situ LEFT
breast after stereotactic guided core biopsy of calcifications.

LABS:  None obtained at the time of imaging.
EXAM:
BILATERAL BREAST MRI WITH AND WITHOUT CONTRAST
TECHNIQUE: Multiplanar, multisequence MR images of both breasts were obtained
prior to and following the intravenous administration of 5 ml of
Gadavist

[Series 1: 3 plane loc · axial · 7.0mm · 1.56mm/px · 1 of 25 slices shown]
[im 1/25]
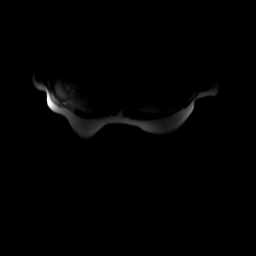

[Series 3: T1 · axial · non-contrast · 1.8mm · 0.62mm/px · z∈[-109,+106]mm · 5 of 240 slices shown]
[im 1/240]
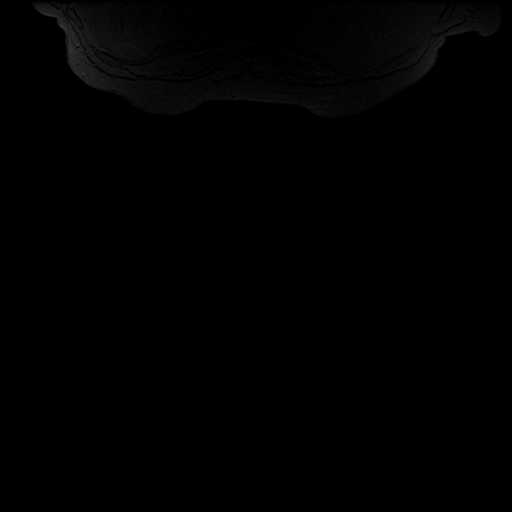
[im 60/240]
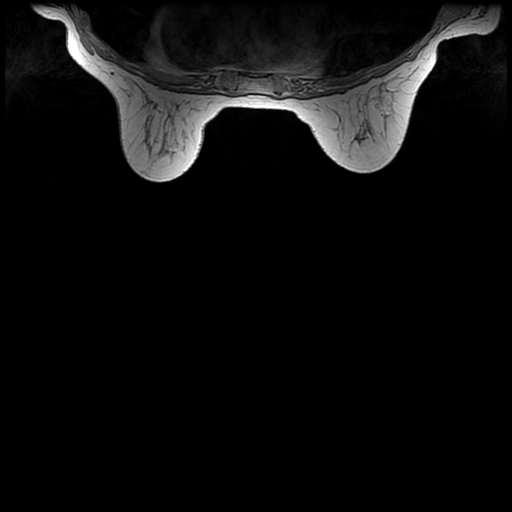
[im 120/240]
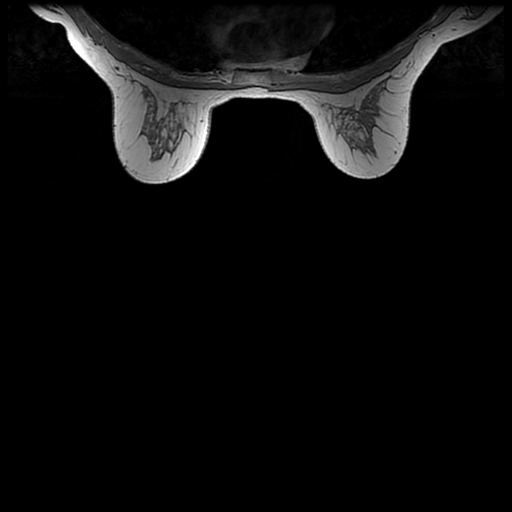
[im 180/240]
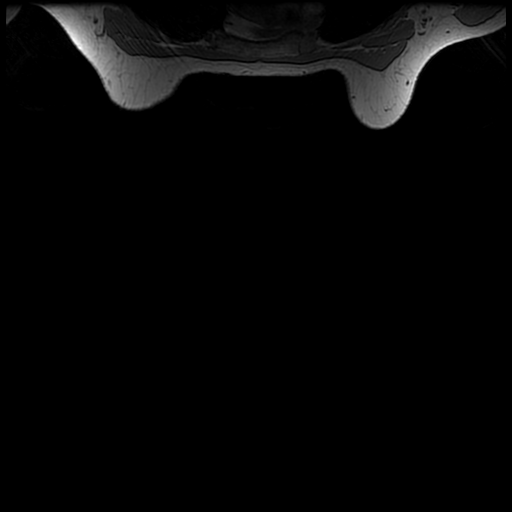
[im 240/240]
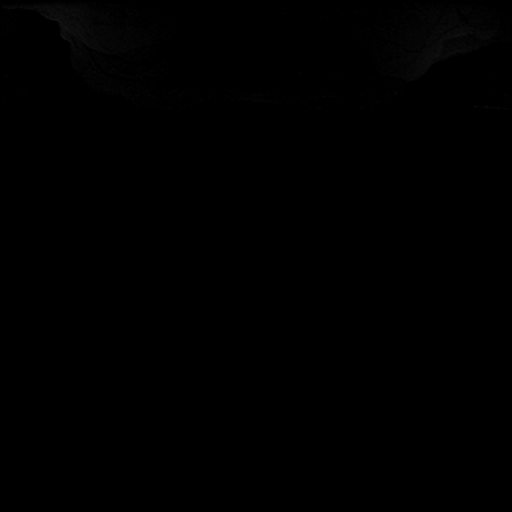

[Series 4: T2 · axial · 3.0mm · 0.70mm/px · 1 of 72 slices shown]
[im 1/72]
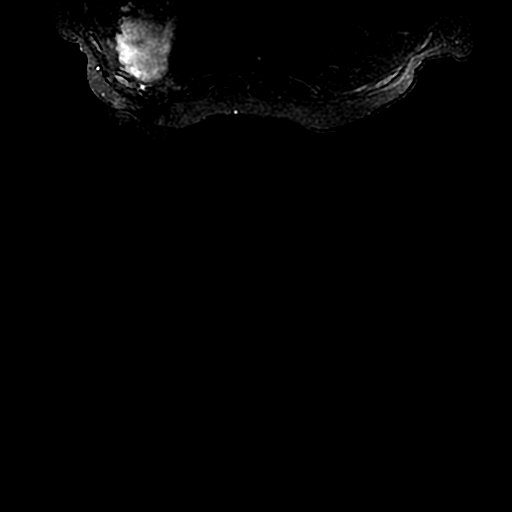

[Series 600: T1 fat-sat · axial · 1.8mm · 0.62mm/px · z∈[-109,+106]mm · 5 of 240 slices shown (1 of 3)]
[im 1/240]
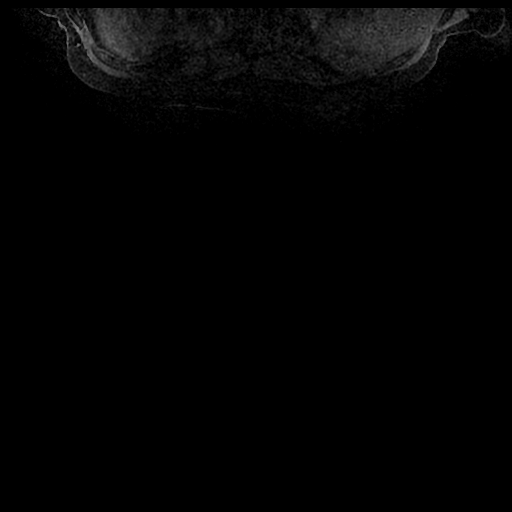
[im 60/240]
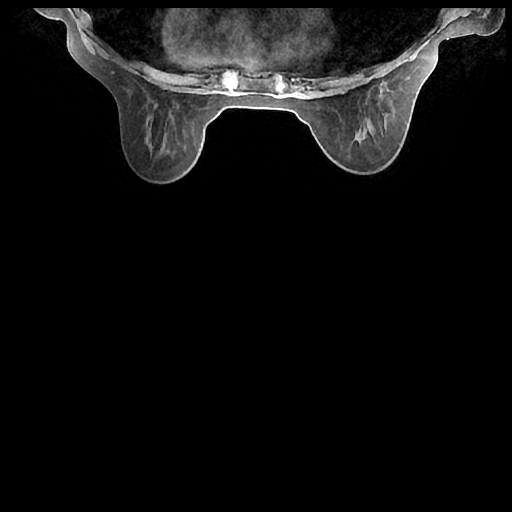
[im 120/240]
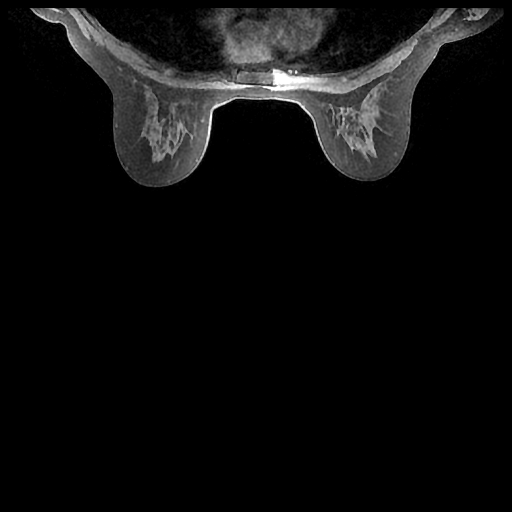
[im 180/240]
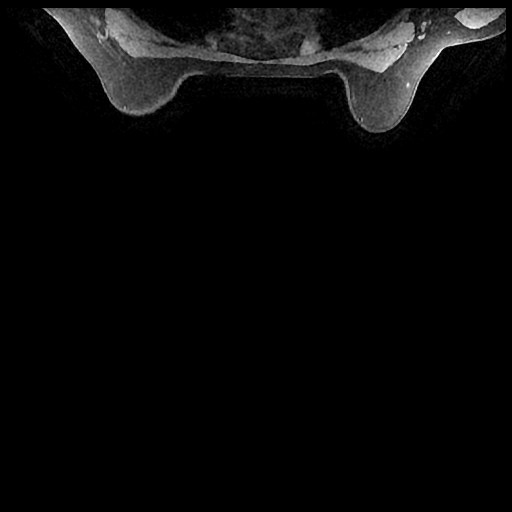
[im 240/240]
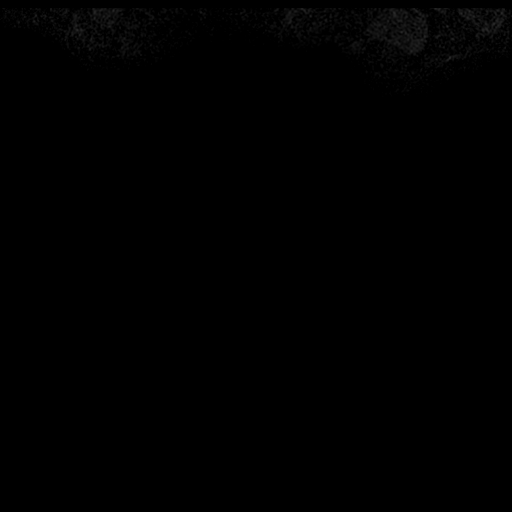

[Series 601: T1 fat-sat · axial · 1.8mm · 0.62mm/px · z∈[-109,+106]mm · 5 of 240 slices shown (2 of 3)]
[im 1/240]
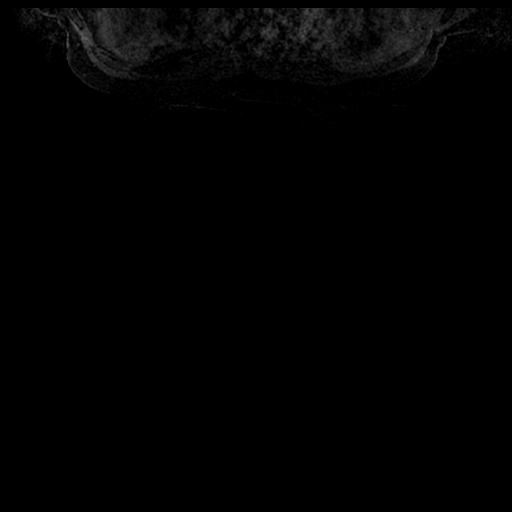
[im 60/240]
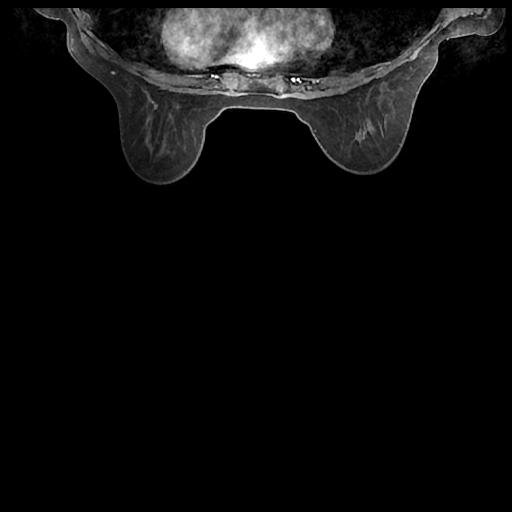
[im 120/240]
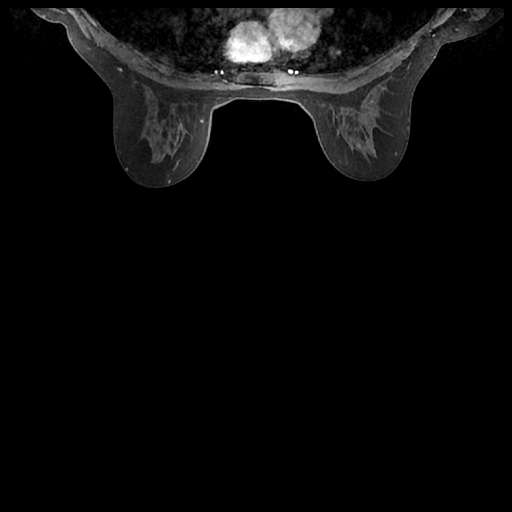
[im 180/240]
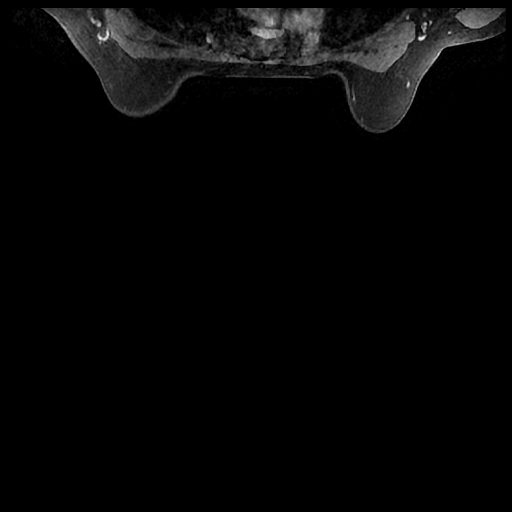
[im 240/240]
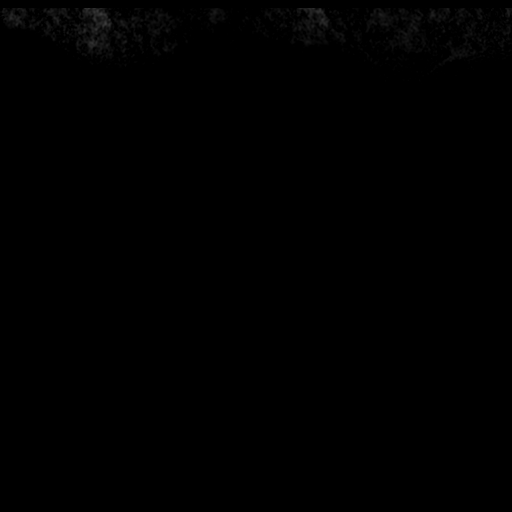

[Series 602: T1 fat-sat · axial · 1.8mm · 0.62mm/px · z∈[-109,-67]mm · 2 of 240 slices shown (3 of 3)]
[im 1/240]
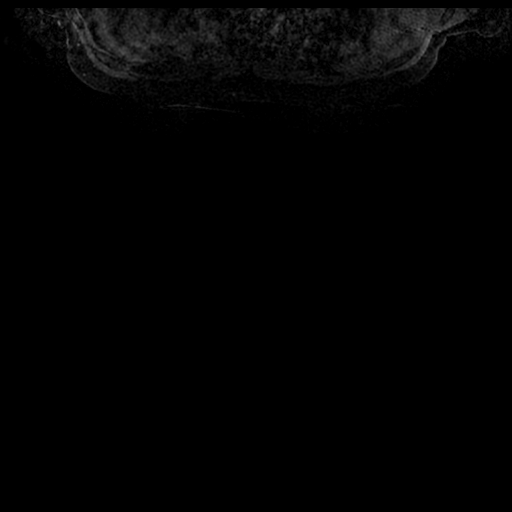
[im 48/240]
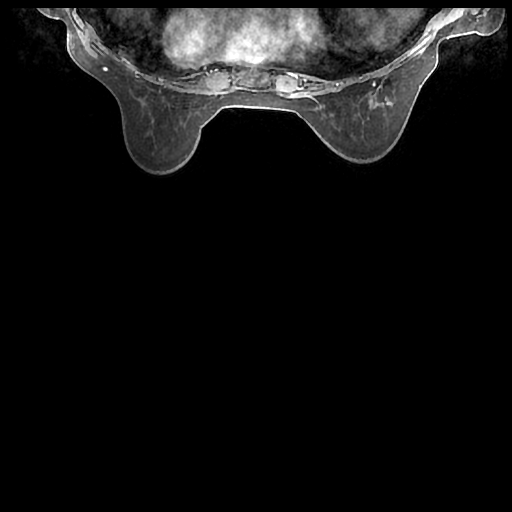

[19 of 48 positions shown; findings below may reference images not displayed]

Three-dimensional MR images were rendered by post-processing of the
original MR data on an independent workstation. The
three-dimensional MR images were interpreted, and findings are
reported in the following complete MRI report for this study. Three
dimensional images were evaluated at the independent [employer]
workstation
FINDINGS: Breast composition: c. Heterogeneous fibroglandular tissue.

Background parenchymal enhancement: Moderate

Right breast: No mass or abnormal enhancement.

Left breast: Regional area of non mass enhancement involving the
LOWER OUTER QUADRANT of the LEFT breast. Within this enhancement
there is tissue clip artifact consistent with recent stereotactic
guided core biopsy. The area of non mass enhancement measures 3.9 x
2.0 x 2.7 centimeters and demonstrates rapid plateau type
enhancement kinetics. This area correlates well with the extent of
calcifications in the LOWER OUTER QUADRANT.

1.5 centimeters anterior to the non mass enhancement there is a
small oval mass with irregular margins which measures 0.3 x 0.5 x
0.6 centimeters. This mass has similar enhancement characteristics
and may represent the anterior extent of disease. Mass is
centimeters deep to the nipple.

Lymph nodes: No abnormal appearing lymph nodes.

Ancillary findings:  None.
IMPRESSION: 1. 3.9 centimeter area of non mass enhancement in the LOWER OUTER
QUADRANT of the LEFT breast, correlating well with the extent of
calcifications seen mammographically. The clip is centrally located
within this group of calcifications/non mass enhancement.
2. Small possible satellite nodule anterior to the non mass
enhancement measuring 0.6 centimeters. Consider MR guided core
biopsy of this lesion to define the anterior extent. If biopsy of
this lesion is benign and concordant, I would recommend localization
of the entire area of calcifications, to include the anterior and
posterior components.

RECOMMENDATION:
MR guided core biopsy of possible satellite nodule in the
retroareolar region of the LEFT breast, anterior to non mass
enhancement corresponding to known malignancy in the LOWER OUTER
QUADRANT of the LEFT breast.

BI-RADS CATEGORY  4: Suspicious.

## 2020-03-22 ENCOUNTER — Other Ambulatory Visit: Payer: Self-pay | Admitting: Hematology

## 2020-03-22 DIAGNOSIS — Z1231 Encounter for screening mammogram for malignant neoplasm of breast: Secondary | ICD-10-CM

## 2020-04-02 ENCOUNTER — Other Ambulatory Visit: Payer: Self-pay | Admitting: Nurse Practitioner

## 2020-04-27 ENCOUNTER — Other Ambulatory Visit: Payer: Self-pay

## 2020-04-27 ENCOUNTER — Encounter: Payer: Self-pay | Admitting: Family Medicine

## 2020-04-27 ENCOUNTER — Ambulatory Visit (INDEPENDENT_AMBULATORY_CARE_PROVIDER_SITE_OTHER): Admitting: Family Medicine

## 2020-04-27 VITALS — BP 98/63 | HR 99 | Ht 66.0 in | Wt 128.0 lb

## 2020-04-27 DIAGNOSIS — D0512 Intraductal carcinoma in situ of left breast: Secondary | ICD-10-CM | POA: Diagnosis not present

## 2020-04-27 DIAGNOSIS — G40209 Localization-related (focal) (partial) symptomatic epilepsy and epileptic syndromes with complex partial seizures, not intractable, without status epilepticus: Secondary | ICD-10-CM | POA: Diagnosis not present

## 2020-04-27 DIAGNOSIS — G4721 Circadian rhythm sleep disorder, delayed sleep phase type: Secondary | ICD-10-CM | POA: Diagnosis not present

## 2020-04-27 MED ORDER — GABAPENTIN 100 MG PO CAPS: ORAL_CAPSULE | ORAL | 4 refills | Status: DC

## 2020-04-27 MED ORDER — OXCARBAZEPINE 300 MG PO TABS: ORAL_TABLET | ORAL | 4 refills | Status: DC

## 2020-04-27 NOTE — Progress Notes (Signed)
PATIENT: Sally Nguyen DOB: 12-13-1955  REASON FOR VISIT: follow up HISTORY FROM: patient  Chief Complaint  Patient presents with  . Follow-up    Rm 2 here for a f/u on epilepsy. Pt is having no new sx     HISTORY OF PRESENT ILLNESS: Today 04/27/20 Sally Nguyen is a 64 y.o. female here today for follow up for seizures and delayed sleep phase. She continues oxcarbamazepine 450mg  in am and 600mg  in pm. Last seizure in 2004. She continues gabapentin 100-200mg  at bedtime which helps with sleep. She was diagnosed with left sided breast cancer. She had a left mastectomy on 06/19/2019 but did not require any additional treatment. She is followed regularly by oncology. Labs with PCP normal in 02/2020.   HISTORY: (copied from Dr Garth Bigness note on 05/14/2019)  She denies any more seizures.  Her last seizure was in 2004.  Prior to that, she was having about 1-2 seizures per year.  She tolerates the oxcarbazepine well. Current dose is 450 mg qAM and 600 mg qHS. In 2018, the blood level was low normal.  Her last EEG was in 2017 and showed hypersynchronous activity in the right temporal region that could be consistent with a seizure disorder.  An EEG in 2016 showed a left temporal focus.  She reports that most EEGs have shown some abnormality.  Her second issue is a mild sleep disturbance.  She has delayed phase sleep syndrome usually going to bed around 2 AM hours of sleep.  When she falls asleep she does well.  Adding the gabapentin before bedtime has helped her fall asleep and stay asleep better.Marland Kitchen   REVIEW OF SYSTEMS: Out of a complete 14 system review of symptoms, the patient complains only of the following symptoms, insomnia and all other reviewed systems are negative.   ALLERGIES: Allergies  Allergen Reactions  . Penicillins Rash    HOME MEDICATIONS: Outpatient Medications Prior to Visit  Medication Sig Dispense Refill  . Calcium Citrate (CITRACAL PO) Take by mouth. +vit d    .  cetirizine (ZYRTEC) 10 MG tablet Take 10 mg by mouth daily.    . cholecalciferol (VITAMIN D) 1000 units tablet Take 2,000 Units by mouth daily.    Marland Kitchen UNABLE TO FIND 500 mg. Med Name: Tumeric Curcumin    . gabapentin (NEURONTIN) 100 MG capsule TAKE 1 OR 2 CAPSULES AT BEDTIME 180 capsule 4  . Oxcarbazepine (TRILEPTAL) 300 MG tablet Take 1 and 1/2 tablet in the morning and 2 tablets in the evening. 360 tablet 4  . UNABLE TO FIND Med Name: [employer]    . valACYclovir (VALTREX) 500 MG tablet TAKE 1 TABLET BY MOUTH TWICE A DAY 180 tablet 1   No facility-administered medications prior to visit.    PAST MEDICAL HISTORY: Past Medical History:  Diagnosis Date  . Cancer ([place]) 05/2019   left breast DCIS  . Seizures ([place])   . Vision abnormalities     PAST SURGICAL HISTORY: Past Surgical History:  Procedure Laterality Date  . COLONOSCOPY    . MASTECTOMY W/ SENTINEL NODE BIOPSY Left 06/19/2019   Procedure: LEFT MASTECTOMY WITH SENTINEL LYMPH NODE BIOPSY;  Surgeon: Stark Klein, MD;  Location: [facility];  Service: General;  Laterality: Left;  . WISDOM TOOTH EXTRACTION      FAMILY HISTORY: Family History  Problem Relation Age of Onset  . Liver cancer Mother   . Colon cancer Mother        65  .  Atrial fibrillation Father   . Obesity Sister     SOCIAL HISTORY: Social History   Socioeconomic History  . Marital status: Married    Spouse name: Not on file  . Number of children: 1  . Years of education: Not on file  . Highest education level: Not on file  Occupational History  . Not on file  Tobacco Use  . Smoking status: Never Smoker  . Smokeless tobacco: Never Used  Substance and Sexual Activity  . Alcohol use: No  . Drug use: No  . Sexual activity: Not on file  Other Topics Concern  . Not on file  Social History Narrative  . Not on file   Social Determinants of Health   Financial Resource Strain:   . Difficulty of Paying Living Expenses:   Food Insecurity:    . Worried About Charity fundraiser in the Last Year:   . Arboriculturist in the Last Year:   Transportation Needs:   . Film/video editor (Medical):   Marland Kitchen Lack of Transportation (Non-Medical):   Physical Activity:   . Days of Exercise per Week:   . Minutes of Exercise per Session:   Stress:   . Feeling of Stress :   Social Connections:   . Frequency of Communication with Friends and Family:   . Frequency of Social Gatherings with Friends and Family:   . Attends Religious Services:   . Active Member of Clubs or Organizations:   . Attends Archivist Meetings:   Marland Kitchen Marital Status:   Intimate Partner Violence:   . Fear of Current or Ex-Partner:   . Emotionally Abused:   Marland Kitchen Physically Abused:   . Sexually Abused:       PHYSICAL EXAM  Vitals:   04/27/20 1310  BP: 98/63  Pulse: 99  Weight: 128 lb (58.1 kg)  Height: 5\' 6"  (1.676 m)   Body mass index is 20.66 kg/m.  Generalized: Well developed, in no acute distress  Cardiology: normal rate and rhythm, no murmur noted Respiratory: clear to auscultation bilaterally  Neurological examination  Mentation: Alert oriented to time, place, history taking. Follows all commands speech and language fluent Cranial nerve II-XII: Pupils were equal round reactive to light. Extraocular movements were full, visual field were full on confrontational test. Facial sensation and strength were normal. Uvula tongue midline. Head turning and shoulder shrug  were normal and symmetric. Motor: The motor testing reveals 5 over 5 strength of all 4 extremities. Good symmetric motor tone is noted throughout.  Sensory: Sensory testing is intact to soft touch on all 4 extremities. No evidence of extinction is noted.  Coordination: Cerebellar testing reveals good finger-nose-finger and heel-to-shin bilaterally.  Gait and station: Gait is normal.   DIAGNOSTIC DATA (LABS, IMAGING, TESTING) - I reviewed patient records, labs, notes, testing and  imaging myself where available.  No flowsheet data found.   Lab Results  Component Value Date   WBC 5.2 02/19/2020   HGB 14.3 02/19/2020   HCT 43.5 02/19/2020   MCV 87 02/19/2020   PLT 308 02/19/2020      Component Value Date/Time   NA 142 02/19/2020 1406   K 4.3 02/19/2020 1406   CL 103 02/19/2020 1406   CO2 25 02/19/2020 1406   GLUCOSE 79 02/19/2020 1406   GLUCOSE 88 05/21/2019 1202   BUN 14 02/19/2020 1406   CREATININE 0.94 02/19/2020 1406   CREATININE 0.88 05/21/2019 1202   CALCIUM 10.1 02/19/2020 1406  PROT 7.6 02/19/2020 1406   ALBUMIN 4.6 02/19/2020 1406   AST 16 02/19/2020 1406   AST 12 (L) 05/21/2019 1202   ALT 10 02/19/2020 1406   ALT 9 05/21/2019 1202   ALKPHOS 72 02/19/2020 1406   BILITOT <0.2 02/19/2020 1406   BILITOT 0.2 (L) 05/21/2019 1202   GFRNONAA 65 02/19/2020 1406   GFRNONAA >60 05/21/2019 1202   GFRAA 75 02/19/2020 1406   GFRAA >60 05/21/2019 1202   Lab Results  Component Value Date   CHOL 192 02/19/2020   HDL 64 02/19/2020   LDLCALC 110 (H) 02/19/2020   TRIG 103 02/19/2020   CHOLHDL 3.0 02/19/2020   Lab Results  Component Value Date   HGBA1C 5.9 (H) 02/19/2020   No results found for: VITAMINB12 No results found for: TSH     ASSESSMENT AND PLAN 64 y.o. year old female  has a past medical history of Cancer ([place]) (05/2019), Seizures ([place]), and Vision abnormalities. here with     ICD-10-CM   1. Partial symptomatic epilepsy with complex partial seizures, not intractable, without status epilepticus ([place])  G40.209   2. 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 Ductal carcinoma in situ (DCIS) of left breast  D05.12     Brookelynn is doing very well today.  She continues oxcarbamazepine 450 mg in the morning and 600 mg in the evenings.  No recent seizure activity.  Gabapentin 1 to 200 mg in the evenings helps with delayed sleep phase.  She is tolerating medications well with no obvious adverse effects.  We will continue current therapy.  Lab  work has been reviewed in epic.  She was encouraged to stay well-hydrated, focus on a well-balanced diet and regular exercise.  She will continue close follow-up with primary care and oncology for recent breast cancer diagnosis.  She has a mammogram scheduled.  She will follow-up with me in 1 year, sooner if needed.  She verbalizes understanding and agreement with this plan.   No orders of the defined types were placed in this encounter.    Meds ordered this encounter  Medications  . Oxcarbazepine (TRILEPTAL) 300 MG tablet    Sig: Take 1 and 1/2 tablet in the morning and 2 tablets in the evening.    Dispense:  360 tablet    Refill:  4    Order Specific Question:   Supervising Provider    Answer:   Melvenia Beam [id number]  . gabapentin (NEURONTIN) 100 MG capsule    Sig: TAKE 1 OR 2 CAPSULES AT BEDTIME    Dispense:  180 capsule    Refill:  4    Order Specific Question:   Supervising Provider    Answer:   Melvenia Beam [id number]      I spent 15 minutes with the patient. 50% of this time was spent counseling and educating patient on plan of care and medications.    Debbora Presto, FNP-C 04/27/2020, 2:24 PM [facility] [address] [place], [place] [address] [phone]

## 2020-04-27 NOTE — Patient Instructions (Addendum)
We will continue oxcarbamazepine 450mg  in am and 600mg  in pm. Continue gabapentin 100-200mg  at bedtime.   Stay well hydrated. Eat a well balanced diet and exercise regularly  Follow up in 1 year, sooner if needed   Seizure, Adult A seizure is a sudden burst of abnormal electrical activity in the brain. Seizures usually last from 30 seconds to 2 minutes. They can cause many different symptoms. Usually, seizures are not harmful unless they last a long time. What are the causes? Common causes of this condition include:  Fever or infection.  Conditions that affect the brain, such as: ? A brain abnormality that you were born with. ? A brain or head injury. ? Bleeding in the brain. ? A tumor. ? Stroke. ? Brain disorders such as autism or cerebral palsy.  Low blood sugar.  Conditions that are passed from parent to child (are inherited).  Problems with substances, such as: ? Having a reaction to a drug or a medicine. ? Suddenly stopping the use of a substance (withdrawal). In some cases, the cause may not be known. A person who has repeated seizures over time without a clear cause has a condition called epilepsy. What increases the risk? You are more likely to get this condition if you have:  A family history of epilepsy.  Had a seizure in the past.  A brain disorder.  A history of head injury, lack of oxygen at birth, or strokes. What are the signs or symptoms? There are many types of seizures. The symptoms vary depending on the type of seizure you have. Examples of symptoms during a seizure include:  Shaking (convulsions).  Stiffness in the body.  Passing out (losing consciousness).  Head nodding.  Staring.  Not responding to sound or touch.  Loss of bladder control and bowel control. Some people have symptoms right before and right after a seizure happens. Symptoms before a seizure may include:  Fear.  Worry (anxiety).  Feeling like you may vomit  (nauseous).  Feeling like the room is spinning (vertigo).  Feeling like you saw or heard something before (dj vu).  Odd tastes or smells.  Changes in how you see. You may see flashing lights or spots. Symptoms after a seizure happens can include:  Confusion.  Sleepiness.  Headache.  Weakness on one side of the body. How is this treated? Most seizures will stop on their own in under 5 minutes. In these cases, no treatment is needed. Seizures that last longer than 5 minutes will usually need treatment. Treatment can include:  Medicines given through an IV tube.  Avoiding things that are known to cause your seizures. These can include medicines that you take for another condition.  Medicines to treat epilepsy.  Surgery to stop the seizures. This may be needed if medicines do not help. Follow these instructions at home: Medicines  Take over-the-counter and prescription medicines only as told by your doctor.  Do not eat or drink anything that may keep your medicine from working, such as alcohol. Activity  Do not do any activities that would be dangerous if you had another seizure, like driving or swimming. Wait until your doctor says it is safe for you to do them.  If you live in the [place] ask your local DMV (department of motor vehicles) when you can drive.  Get plenty of rest. Teaching others Teach friends and family what to do when you have a seizure. They should:  Lay you on the ground.  Protect  your head and body.  Loosen any tight clothing around your neck.  Turn you on your side.  Not hold you down.  Not put anything into your mouth.  Know whether or not you need emergency care.  Stay with you until you are better.  General instructions  Contact your doctor each time you have a seizure.  Avoid anything that gives you seizures.  Keep a seizure diary. Write down: ? What you think caused each seizure. ? What you remember about each seizure.  Keep  all follow-up visits as told by your doctor. This is important. Contact a doctor if:  You have another seizure.  You have seizures more often.  There is any change in what happens during your seizures.  You keep having seizures with treatment.  You have symptoms of being sick or having an infection. Get help right away if:  You have a seizure that: ? Lasts longer than 5 minutes. ? Is different than seizures you had before. ? Makes it harder to breathe. ? Happens after you hurt your head.  You have any of these symptoms after a seizure: ? Not being able to speak. ? Not being able to use a part of your body. ? Confusion. ? A bad headache.  You have two or more seizures in a row.  You do not wake up right after a seizure.  You get hurt during a seizure. These symptoms may be an emergency. Do not wait to see if the symptoms will go away. Get medical help right away. Call your local emergency services (911 in the [place] Do not drive yourself to the hospital. Summary  Seizures usually last from 30 seconds to 2 minutes. Usually, they are not harmful unless they last a long time.  Do not eat or drink anything that may keep your medicine from working, such as alcohol.  Teach friends and family what to do when you have a seizure.  Contact your doctor each time you have a seizure. This information is not intended to replace advice given to you by your health care provider. Make sure you discuss any questions you have with your health care provider. Document Revised: 02/07/2019 Document Reviewed: 02/07/2019 Elsevier Patient Education  [employer].   Insomnia Insomnia is a sleep disorder that makes it difficult to fall asleep or stay asleep. Insomnia can cause fatigue, low energy, difficulty concentrating, mood swings, and poor performance at work or school. There are three different ways to classify insomnia:  Difficulty falling asleep.  Difficulty staying asleep.   Waking up too early in the morning. Any type of insomnia can be long-term (chronic) or short-term (acute). Both are common. Short-term insomnia usually lasts for three months or less. Chronic insomnia occurs at least three times a week for longer than three months. What are the causes? Insomnia may be caused by another condition, situation, or substance, such as:  Anxiety.  Certain medicines.  Gastroesophageal reflux disease (GERD) or other gastrointestinal conditions.  Asthma or other breathing conditions.  Restless legs syndrome, sleep apnea, or other sleep disorders.  Chronic pain.  Menopause.  Stroke.  Abuse of alcohol, tobacco, or illegal drugs.  Mental health conditions, such as depression.  Caffeine.  Neurological disorders, such as Alzheimer's disease.  An overactive thyroid (hyperthyroidism). Sometimes, the cause of insomnia may not be known. What increases the risk? Risk factors for insomnia include:  Gender. Women are affected more often than men.  Age. Insomnia is more common as you  get older.  Stress.  Lack of exercise.  Irregular work schedule or working night shifts.  Traveling between different time zones.  Certain medical and mental health conditions. What are the signs or symptoms? If you have insomnia, the main symptom is having trouble falling asleep or having trouble staying asleep. This may lead to other symptoms, such as:  Feeling fatigued or having low energy.  Feeling nervous about going to sleep.  Not feeling rested in the morning.  Having trouble concentrating.  Feeling irritable, anxious, or depressed. How is this diagnosed? This condition may be diagnosed based on:  Your symptoms and medical history. Your health care provider may ask about: ? Your sleep habits. ? Any medical conditions you have. ? Your mental health.  A physical exam. How is this treated? Treatment for insomnia depends on the cause. Treatment may focus  on treating an underlying condition that is causing insomnia. Treatment may also include:  Medicines to help you sleep.  Counseling or therapy.  Lifestyle adjustments to help you sleep better. Follow these instructions at home: Eating and drinking   Limit or avoid alcohol, caffeinated beverages, and cigarettes, especially close to bedtime. These can disrupt your sleep.  Do not eat a large meal or eat spicy foods right before bedtime. This can lead to digestive discomfort that can make it hard for you to sleep. Sleep habits   Keep a sleep diary to help you and your health care provider figure out what could be causing your insomnia. Write down: ? When you sleep. ? When you wake up during the night. ? How well you sleep. ? How rested you feel the next day. ? Any side effects of medicines you are taking. ? What you eat and drink.  Make your bedroom a dark, comfortable place where it is easy to fall asleep. ? Put up shades or blackout curtains to block light from outside. ? Use a white noise machine to block noise. ? Keep the temperature cool.  Limit screen use before bedtime. This includes: ? Watching TV. ? Using your smartphone, tablet, or computer.  Stick to a routine that includes going to bed and waking up at the same times every day and night. This can help you fall asleep faster. Consider making a quiet activity, such as reading, part of your nighttime routine.  Try to avoid taking naps during the day so that you sleep better at night.  Get out of bed if you are still awake after 15 minutes of trying to sleep. Keep the lights down, but try reading or doing a quiet activity. When you feel sleepy, go back to bed. General instructions  Take over-the-counter and prescription medicines only as told by your health care provider.  Exercise regularly, as told by your health care provider. Avoid exercise starting several hours before bedtime.  Use relaxation techniques to  manage stress. Ask your health care provider to suggest some techniques that may work well for you. These may include: ? Breathing exercises. ? Routines to release muscle tension. ? Visualizing peaceful scenes.  Make sure that you drive carefully. Avoid driving if you feel very sleepy.  Keep all follow-up visits as told by your health care provider. This is important. Contact a health care provider if:  You are tired throughout the day.  You have trouble in your daily routine due to sleepiness.  You continue to have sleep problems, or your sleep problems get worse. Get help right away if:  You have  serious thoughts about hurting yourself or someone else. If you ever feel like you may hurt yourself or others, or have thoughts about taking your own life, get help right away. You can go to your nearest emergency department or call:  Your local emergency services (911 in the [place]  A suicide crisis helpline, such as the [facility] at 323-346-1748. This is open 24 hours a day. Summary  Insomnia is a sleep disorder that makes it difficult to fall asleep or stay asleep.  Insomnia can be long-term (chronic) or short-term (acute).  Treatment for insomnia depends on the cause. Treatment may focus on treating an underlying condition that is causing insomnia.  Keep a sleep diary to help you and your health care provider figure out what could be causing your insomnia. This information is not intended to replace advice given to you by your health care provider. Make sure you discuss any questions you have with your health care provider. Document Revised: 11/02/2017 Document Reviewed: 08/30/2017 Elsevier Patient Education  2020 [employer].

## 2020-04-27 NOTE — Progress Notes (Signed)
I have read the note, and I agree with the clinical assessment and plan.  Tashona Calk A. Yoshika Vensel, MD, PhD, FAAN Certified in Neurology, Clinical Neurophysiology, Sleep Medicine, Pain Medicine and Neuroimaging  [facility] [address], [address] [place], [place] [address] [phone]  

## 2020-04-29 ENCOUNTER — Ambulatory Visit
Admission: RE | Admit: 2020-04-29 | Discharge: 2020-04-29 | Disposition: A | Discharge status: Home / Self Care | Source: Ambulatory Visit | Attending: Hematology | Admitting: Hematology

## 2020-04-29 DIAGNOSIS — Z1231 Encounter for screening mammogram for malignant neoplasm of breast: Secondary | ICD-10-CM

## 2020-05-17 ENCOUNTER — Ambulatory Visit: Admitting: Family Medicine

## 2020-07-11 ENCOUNTER — Other Ambulatory Visit: Payer: Self-pay | Admitting: Nurse Practitioner

## 2020-07-11 DIAGNOSIS — D0512 Intraductal carcinoma in situ of left breast: Secondary | ICD-10-CM

## 2020-07-11 NOTE — Progress Notes (Addendum)
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   05/14/2019 Initial Biopsy   Diagnosis 05/14/19  Breast, left, needle core biopsy, outer mid - DUCTAL CARCINOMA IN SITU WITH CALCIFICATIONS. - SEE COMMENT. Estrogen Receptor: 0%, NEGATIVE Progesterone Receptor: 0%, NEGATIVE   05/14/2019 Cancer Staging   Staging form: Breast, AJCC 8th Edition - Clinical stage from 05/14/2019: Stage Unknown (cTis (DCIS), cNX, cM0, ER-, PR-, HER2: Not Assessed) - Signed by Truitt Merle, MD on 05/20/2019 Cancer Staging Ductal carcinoma in situ (DCIS) of left breast Staging form: Breast, AJCC 8th Edition - Clinical stage from 05/14/2019: Stage Unknown (cTis (DCIS), cNX, cM0, ER-, PR-, HER2: Not Assessed) - Signed by Truitt Merle, MD on 05/20/2019 - Pathologic stage from 06/09/2019: Stage 0 (pTis (DCIS), pN0, cM0, G3, ER-, PR-, HER2: Not Assessed) - Signed by Alla Feeling, NP on 10/15/2019    05/15/2019 Initial Diagnosis   Ductal carcinoma in situ (DCIS) of left breast   05/27/2019 Breast MRI   Breast MRI 05/27/19  IMPRESSION: 1. 3.9 centimeter area of non mass enhancement in the LOWER OUTER QUADRANT of the LEFT breast, correlating well with the  extent of calcifications seen mammographically. The clip is centrally located within this group of calcifications/non mass enhancement. 2. Small possible satellite nodule anterior to the non mass enhancement measuring 0.6 centimeters. Consider MR guided core biopsy of this lesion to define the anterior extent. If biopsy of this lesion is benign and concordant, I would recommend localization of the entire area of calcifications, to include the anterior and posterior components.   06/09/2019 Pathology Results   Diagnosis 06/09/19 Breast, left, needle core biopsy, LOQ - DUCTAL CARCINOMA IN SITU WITH CALCIFICATIONS AND NECROSIS, SEE COMMENT. Results: IMMUNOHISTOCHEMICAL AND MORPHOMETRIC ANALYSIS PERFORMED MANUALLY Estrogen Receptor: 0%, NEGATIVE Progesterone Receptor: 0%, NEGATIVE   06/09/2019 Cancer Staging   Staging form: Breast, AJCC 8th Edition - Pathologic stage from 06/09/2019: Stage 0 (pTis (DCIS), pN0, cM0, G3, ER-, PR-, HER2: Not Assessed) - Signed by Alla Feeling, NP on 10/15/2019   06/19/2019 Surgery   LEFT MASTECTOMY WITH SENTINEL LYMPH NODE BIOPSY by Dr. Barry Dienes  06/19/19    06/19/2019 Pathology Results   Diagnosis 06/19/19 1. Breast, simple mastectomy, Left - DUCTAL CARCINOMA IN SITU, HIGH GRADE, WITH CALCIFICATIONS AND NECROSIS - MARGINS UNINVOLVED BY CARCINOMA (2.5 CM; POSTERIOR) - PREVIOUS BIOPSY SITE CHANGES - SEE ONCOLOGY TABLE AND COMMENT BELOW 2. Lymph node, sentinel, biopsy, Left #1 - NO CARCINOMA IDENTIFIED IN ONE LYMPH NODE (0/1) 3. Lymph node, sentinel, biopsy, Left #2 - NO CARCINOMA IDENTIFIED IN ONE LYMPH NODE (0/1)   10/15/2019 Survivorship   Per [name], NP      CURRENT THERAPY: Surveillance   INTERVAL HISTORY: Sally Nguyen returns for f/u as scheduled. She was last seen by me  for survivorship visit on 10/15/19. She had a screening right mammogram on 04/29/20 that was negative.  She feels well but more fatigued in general since mastectomy.  The soft  tissue prominence in the left axilla is bothersome especially at night.  Sensation in the chest is coming back unevenly.  Denies new lump, mass, right nipple discharge.  Denies bone or joint pain.  Denies change in bowel habits, bleeding, recent infection.    MEDICAL HISTORY:  Past Medical History:  Diagnosis Date  . Cancer ([place]) 05/2019   left breast DCIS  . Seizures ([place])   . Vision abnormalities     SURGICAL HISTORY: Past Surgical History:  Procedure Laterality Date  . COLONOSCOPY    . MASTECTOMY Left   . MASTECTOMY W/ SENTINEL NODE BIOPSY Left 06/19/2019   Procedure: LEFT MASTECTOMY WITH SENTINEL LYMPH NODE BIOPSY;  Surgeon: Stark Klein, MD;  Location: [facility];  Service: General;  Laterality: Left;  . WISDOM TOOTH EXTRACTION      I have reviewed the social history and family history with the patient and they are unchanged from previous note.  ALLERGIES:  is allergic to penicillins.  MEDICATIONS:  Current Outpatient Medications  Medication Sig Dispense Refill  . Calcium Citrate (CITRACAL PO) Take by mouth. +vit d    . cetirizine (ZYRTEC) 10 MG tablet Take 10 mg by mouth daily.    . cholecalciferol (VITAMIN D) 1000 units tablet Take 2,000 Units by mouth daily.    Marland Kitchen gabapentin (NEURONTIN) 100 MG capsule TAKE 1 OR 2 CAPSULES AT BEDTIME 180 capsule 4  . Oxcarbazepine (TRILEPTAL) 300 MG tablet Take 1 and 1/2 tablet in the morning and 2 tablets in the evening. 360 tablet 4  . UNABLE TO FIND 500 mg. Med Name: Tumeric Curcumin     No current facility-administered medications for this visit.    PHYSICAL EXAMINATION: ECOG PERFORMANCE STATUS: 0 - Asymptomatic  Vitals:   07/12/20 1105  BP: 103/68  Pulse: 92  Resp: 18  Temp: 97.8 F (36.6 C)  SpO2: 97%   Filed Weights   07/12/20 1105  Weight: 127 lb 6.4 oz (57.8 kg)    GENERAL:alert, no distress and comfortable SKIN: No rash to exposed skin EYES:  sclera clear NECK: Without mass LYMPH:  no palpable  cervical or supraclavicular lymphadenopathy  LUNGS:  normal breathing effort HEART:  no lower extremity edema  NEURO: alert & oriented x 3 with fluent speech Breast: S/p left mastectomy, incision completely healed. Mild soft tissue prominence vs lymphedema in the lateral chest wall/low axilla. There is a 2 cm superficial linear density on the left inner chest wall near the sternum, easier felt when patient sitting up, approx 4 cm superior and medial from the base of the mastectomy scar. No other nodularity or palpable mass in the right breast, axilla, or chest wall.  LABORATORY DATA:  I have reviewed the data as listed CBC Latest Ref Rng & Units 07/12/2020 02/19/2020 05/21/2019  WBC 4.0 - 10.5 K/uL 5.2 5.2 6.0  Hemoglobin 12.0 - 15.0 g/dL 12.3 14.3 12.4  Hematocrit 36 - 46 % 39.2 43.5 39.1  Platelets 150 - 400 K/uL 263 308 258     CMP Latest Ref Rng & Units 07/12/2020 02/19/2020 05/21/2019  Glucose 70 - 99 mg/dL 66(L) 79 88  BUN 8 - 23 mg/dL _0 Creatinine 0.44 - 1.00 mg/dL 0.83 0.94 0.88  Sodium 135 - 145 mmol/L 142 142 141  Potassium 3.5 - 5.1  mmol/L 3.8 4.3 3.9  Chloride 98 - 111 mmol/L 108 103 106  CO2 22 - 32 mmol/L _0 Calcium 8.9 - 10.3 mg/dL 9.9 10.1 9.1  Total Protein 6.5 - 8.1 g/dL 7.2 7.6 7.4  Total Bilirubin 0.3 - 1.2 mg/dL 0.3 <0.2 0.2(L)  Alkaline Phos 38 - 126 U/L 57 72 57  AST 15 - 41 U/L 11(L) 16 12(L)  ALT 0 - 44 U/L _1 RADIOGRAPHIC STUDIES: I have personally reviewed the radiological images as listed and agreed with the findings in the report. No results found.   ASSESSMENT & PLAN: Sally Nguyen is a 64 y.o. female with    1.Ductal carcinoma in situ (DCIS) of left breast, Stage0,pTisN0M0, ER-/PR-, HighGrade -Diagnosed in 05/2019. Her Breast MRI showed another left breast mass anterior to the known mass also DCIS, ER/PR negative.  -Given the extent of the cancer in her left breast she underwent left Mastectomy on 06/19/19 with Dr.  Barry Dienes.  -post-radiation mastectomy was not recommended  -given ER/PR negative disease, she would not benefit from anti-estrogen therapy  -given her high risk for breast cancer and dense tissue cat C, she is under close surveillance with right mammogram alternating with screening breast MRI 6 months apart -Ms. Steines is clinically doing well.  Exam is unremarkable except a small linear density in the left chest wall near the sternum, possibly scar tissue from previous chest tube or mastectomy.  CBC and CMP are normal.  04/2020 right mammogram is negative. 11/2019 breast MRI negative. Overall no clinical concern for recurrence -She will continue close surveillance, next breast MRI in 11/2020, this will also cover the left chest density which the patient will monitor closely at home -She will follow up with Dr. Barry Dienes in 5-6 months and see [place] in 07/2021 -The patient was seen with Dr. Burr Medico today  2. Left breast and arm numbness and Lymphedema -Secondary to surgery  -completed PT -sensation returning unequally throughout the chest wall    3. Seizures/Epilepsy -She is on Trileptal and Gabapentin  -She will continue to follow up with neurologist.  PLAN: -Labs, breast MRI, right mammogram reviewed -continue close surveillance, patient will monitor left chest wall density  -Bilateral breast MRI 11/2020 -F/u with Dr. Barry Dienes in 6 months -Lab, annual f/u with [place] 07/2021   Orders Placed This Encounter  Procedures  . MR BREAST BILATERAL W WO CONTRAST INC CAD    Standing Status:   Future    Standing Expiration Date:   07/12/2021    Order Specific Question:   If indicated for the ordered procedure, I authorize the administration of contrast media per Radiology protocol    Answer:   Yes    Order Specific Question:   What is the patient's sedation requirement?    Answer:   No Sedation    Order Specific Question:   Does the patient have a pacemaker or implanted devices?    Answer:   No    Order  Specific Question:   Radiology Contrast Protocol - do NOT remove file path    Answer:   [email or web address]    Order Specific Question:   Preferred imaging location?    Answer:   [facility] (table limit-550lbs)  . MM 3D SCREEN BREAST UNI RIGHT    Standing Status:   Future    Standing Expiration Date:   07/12/2021    Order Specific Question:   Reason for Exam (SYMPTOM  OR DIAGNOSIS REQUIRED)    Answer:   h/o left breast DCIS s/p mastectomy    Order Specific Question:   Preferred imaging location?    Answer:   [facility]   All questions were answered. The patient knows to call the clinic with any problems, questions or concerns. No barriers to learning was detected.     Alla Feeling, NP 07/12/20   Addendum  I have seen the patient, examined her. I agree with the assessment and and plan and have edited the notes.   She has a small area of soft tissue density at upper inner of left chest wall above the mastectomy site, possible scar tissue. I discussed the risk of new breast cancer after mastectomy is less than 5% and there is no risk of recurrence from DCIS. I reassured her, and recommend her to watch this area and call us if it gets worse. She agrees with the plan. She will see Dr. Barry Dienes in 6 months and [place] in a year.  Truitt Merle  07/12/2020

## 2020-07-12 ENCOUNTER — Inpatient Hospital Stay: Attending: Nurse Practitioner

## 2020-07-12 ENCOUNTER — Other Ambulatory Visit: Payer: Self-pay

## 2020-07-12 ENCOUNTER — Encounter: Payer: Self-pay | Admitting: Nurse Practitioner

## 2020-07-12 ENCOUNTER — Inpatient Hospital Stay (HOSPITAL_BASED_OUTPATIENT_CLINIC_OR_DEPARTMENT_OTHER): Admitting: Nurse Practitioner

## 2020-07-12 VITALS — BP 103/68 | HR 92 | Temp 97.8°F | Resp 18 | Ht 66.0 in | Wt 127.4 lb

## 2020-07-12 DIAGNOSIS — G40909 Epilepsy, unspecified, not intractable, without status epilepticus: Secondary | ICD-10-CM | POA: Insufficient documentation

## 2020-07-12 DIAGNOSIS — Z9012 Acquired absence of left breast and nipple: Secondary | ICD-10-CM | POA: Insufficient documentation

## 2020-07-12 DIAGNOSIS — Z86000 Personal history of in-situ neoplasm of breast: Secondary | ICD-10-CM | POA: Diagnosis present

## 2020-07-12 DIAGNOSIS — I890 Lymphedema, not elsewhere classified: Secondary | ICD-10-CM | POA: Diagnosis not present

## 2020-07-12 DIAGNOSIS — Z79899 Other long term (current) drug therapy: Secondary | ICD-10-CM | POA: Diagnosis not present

## 2020-07-12 DIAGNOSIS — D0512 Intraductal carcinoma in situ of left breast: Secondary | ICD-10-CM

## 2020-07-12 DIAGNOSIS — Z1231 Encounter for screening mammogram for malignant neoplasm of breast: Secondary | ICD-10-CM

## 2020-07-12 LAB — CMP (CANCER CENTER ONLY)
ALT: 8 U/L (ref 0–44)
AST: 11 U/L — ABNORMAL LOW (ref 15–41)
Albumin: 3.9 g/dL (ref 3.5–5.0)
Alkaline Phosphatase: 57 U/L (ref 38–126)
Anion gap: 6 (ref 5–15)
BUN: 14 mg/dL (ref 8–23)
CO2: 28 mmol/L (ref 22–32)
Calcium: 9.9 mg/dL (ref 8.9–10.3)
Chloride: 108 mmol/L (ref 98–111)
Creatinine: 0.83 mg/dL (ref 0.44–1.00)
GFR, Est AFR Am: >60 mL/min (ref >60)
GFR, Estimated: >60 mL/min (ref >60)
Glucose, Bld: 66 mg/dL — ABNORMAL LOW (ref 70–99)
Potassium: 3.8 mmol/L (ref 3.5–5.1)
Sodium: 142 mmol/L (ref 135–145)
Total Bilirubin: 0.3 mg/dL (ref 0.3–1.2)
Total Protein: 7.2 g/dL (ref 6.5–8.1)

## 2020-07-12 LAB — CBC WITH DIFFERENTIAL (CANCER CENTER ONLY)
Abs Immature Granulocytes: 0.01 10*3/uL (ref 0.00–0.07)
Basophils Absolute: 0.1 10*3/uL (ref 0.0–0.1)
Basophils Relative: 1 %
Eosinophils Absolute: 0.1 10*3/uL (ref 0.0–0.5)
Eosinophils Relative: 2 %
HCT: 39.2 % (ref 36.0–46.0)
Hemoglobin: 12.3 g/dL (ref 12.0–15.0)
Immature Granulocytes: 0 %
Lymphocytes Relative: 24 %
Lymphs Abs: 1.2 10*3/uL (ref 0.7–4.0)
MCH: 28.3 pg (ref 26.0–34.0)
MCHC: 31.4 g/dL (ref 30.0–36.0)
MCV: 90.3 fL (ref 80.0–100.0)
Monocytes Absolute: 0.6 10*3/uL (ref 0.1–1.0)
Monocytes Relative: 12 %
Neutro Abs: 3.2 10*3/uL (ref 1.7–7.7)
Neutrophils Relative %: 61 %
Platelet Count: 263 10*3/uL (ref 150–400)
RBC: 4.34 MIL/uL (ref 3.87–5.11)
RDW: 13.5 % (ref 11.5–15.5)
WBC Count: 5.2 10*3/uL (ref 4.0–10.5)
nRBC: 0 % (ref 0.0–0.2)

## 2020-07-13 ENCOUNTER — Telehealth: Payer: Self-pay | Admitting: Nurse Practitioner

## 2020-07-13 NOTE — Telephone Encounter (Signed)
Scheduled per 8/9 los. Mailing pt appt calendar.

## 2020-07-27 ENCOUNTER — Encounter: Payer: Self-pay | Admitting: Nurse Practitioner

## 2020-07-27 ENCOUNTER — Ambulatory Visit (INDEPENDENT_AMBULATORY_CARE_PROVIDER_SITE_OTHER): Admitting: Nurse Practitioner

## 2020-07-27 ENCOUNTER — Other Ambulatory Visit: Payer: Self-pay

## 2020-07-27 VITALS — BP 112/70 | HR 98 | Temp 98.6°F | Ht 66.0 in | Wt 126.0 lb

## 2020-07-27 DIAGNOSIS — R300 Dysuria: Secondary | ICD-10-CM

## 2020-07-27 DIAGNOSIS — R350 Frequency of micturition: Secondary | ICD-10-CM

## 2020-07-27 DIAGNOSIS — Z2821 Immunization not carried out because of patient refusal: Secondary | ICD-10-CM

## 2020-07-27 LAB — POCT URINALYSIS DIPSTICK
Bilirubin, UA: NEGATIVE
Glucose, UA: NEGATIVE
Ketones, UA: NEGATIVE
Nitrite, UA: NEGATIVE
Protein, UA: POSITIVE — AB
Spec Grav, UA: 1.02 (ref 1.010–1.025)
Urobilinogen, UA: 0.2 E.U./dL
pH, UA: 7 (ref 5.0–8.0)

## 2020-07-27 MED ORDER — NITROFURANTOIN MONOHYD MACRO 100 MG PO CAPS: 100.0000 mg | ORAL_CAPSULE | Freq: Two times a day (BID) | ORAL | 0 refills | Status: DC

## 2020-07-27 MED ORDER — NITROFURANTOIN MONOHYD MACRO 100 MG PO CAPS: 100.0000 mg | ORAL_CAPSULE | Freq: Two times a day (BID) | ORAL | 0 refills | Status: AC

## 2020-07-27 NOTE — Progress Notes (Signed)
I,Yamilka Roman [employer] as a Education administrator for [employer], FNP.,have documented all relevant documentation on the behalf of [name], FNP,as directed by  [name], FNP while in the presence of [name], [place]. This visit occurred during the SARS-CoV-2 public health emergency.  Safety protocols were in place, including screening questions prior to the visit, additional usage of staff PPE, and extensive cleaning of exam room while observing appropriate contact time as indicated for disinfecting solutions.  Subjective:     Patient ID: Sally Nguyen , female    DOB: [birth year] , 64 y.o.   MRN: [id number]   Chief Complaint  Patient presents with  . Urinary Frequency    HPI  Dysuria  This is a new problem. The current episode started in the past 7 days (4 days ago). The problem occurs every urination. The problem has been gradually worsening. The quality of the pain is described as burning. There has been no fever. She is sexually active. There is no history of pyelonephritis. Associated symptoms include hesitancy and urgency. Pertinent negatives include no chills, discharge, flank pain or hematuria. Treatments tried: azo. There is no history of catheterization or recurrent UTIs.     Past Medical History:  Diagnosis Date  . Cancer ([place]) 05/2019   left breast DCIS  . Seizures ([place])   . Vision abnormalities      Family History  Problem Relation Age of Onset  . Liver cancer Mother   . Colon cancer Mother        67  . Atrial fibrillation Father   . Obesity Sister      Current Outpatient Medications:  .  Calcium Citrate (CITRACAL PO), Take by mouth. +vit d, Disp: , Rfl:  .  cetirizine (ZYRTEC) 10 MG tablet, Take 10 mg by mouth daily., Disp: , Rfl:  .  cholecalciferol (VITAMIN D) 1000 units tablet, Take 2,000 Units by mouth daily., Disp: , Rfl:  .  gabapentin (NEURONTIN) 100 MG capsule, TAKE 1 OR 2 CAPSULES AT BEDTIME, Disp: 180 capsule, Rfl: 4 .  Oxcarbazepine  (TRILEPTAL) 300 MG tablet, Take 1 and 1/2 tablet in the morning and 2 tablets in the evening., Disp: 360 tablet, Rfl: 4 .  UNABLE TO FIND, 500 mg. Med Name: Tumeric Curcumin, Disp: , Rfl:  .  zinc gluconate 50 MG tablet, Take 50 mg by mouth daily., Disp: , Rfl:    Allergies  Allergen Reactions  . Penicillins Rash     Review of Systems  Constitutional: Negative for chills and fatigue.  Respiratory: Negative.   Cardiovascular: Negative.  Negative for chest pain, palpitations and leg swelling.  Gastrointestinal: Negative.   Genitourinary: Positive for dysuria, hesitancy and urgency. Negative for flank pain and hematuria.  Neurological: Negative for dizziness and numbness.     Today's Vitals   07/27/20 1148  BP: 112/70  Pulse: 98  Temp: 98.6 F (37 C)  TempSrc: Oral  Weight: 126 lb (57.2 kg)  Height: 5\' 6"  (1.676 m)  PainSc: 5    Body mass index is 20.34 kg/m.   Objective:  Physical Exam Vitals reviewed.  Constitutional:      General: She is not in acute distress.    Appearance: Normal appearance.  Cardiovascular:     Rate and Rhythm: Normal rate and regular rhythm.     Pulses: Normal pulses.     Heart sounds: Normal heart sounds. No murmur heard.   Pulmonary:     Effort: Pulmonary effort is normal. No respiratory distress.  Breath sounds: Normal breath sounds.  Neurological:     General: No focal deficit present.     Mental Status: She is alert and oriented to person, place, and time.     Cranial Nerves: No cranial nerve deficit.  Psychiatric:        Mood and Affect: Mood normal.        Behavior: Behavior normal.        Thought Content: Thought content normal.        Judgment: Judgment normal.         Assessment And Plan:     1. Dysuria Urinalysis shows small blood, positive for protein and small leukocytes, due to the symptoms will treat with nitrofurantoin Will send ua for culture - POCT Urinalysis Dipstick (81002) - Culture, Urine - nitrofurantoin,  macrocrystal-monohydrate, (MACROBID) 100 MG capsule; Take 1 capsule (100 mg total) by mouth 2 (two) times daily for 7 days.  Dispense: 14 capsule; Refill: 0  2. Urinary frequency - Culture, Urine - nitrofurantoin, macrocrystal-monohydrate, (MACROBID) 100 MG capsule; Take 1 capsule (100 mg total) by mouth 2 (two) times daily for 7 days.  Dispense: 14 capsule; Refill: 0  3. COVID-19 virus vaccination declined Declines covid 19 vaccine. Discussed risk of covid 33 and if she changes her mind about the vaccine to call the office.  Encouraged to take multivitamin, vitamin d, vitamin c and zinc to increase immune system. Aware can call office if would like to have vaccine here at office.     Patient was given opportunity to ask questions. Patient verbalized understanding of the plan and was able to repeat key elements of the plan. All questions were answered to their satisfaction.  [name], FNP   I, [name], FNP, have reviewed all documentation for this visit. The documentation on 08/25/20 for the exam, diagnosis, procedures, and orders are all accurate and complete.  THE PATIENT IS ENCOURAGED TO PRACTICE SOCIAL DISTANCING DUE TO THE COVID-19 PANDEMIC.

## 2020-07-27 NOTE — Patient Instructions (Signed)
COVID-19 Vaccine Information can be found at: [email or web address] For questions related to vaccine distribution or appointments, please email [email or web address] or call [phone].    

## 2020-07-29 LAB — URINE CULTURE: Organism ID, Bacteria: NO GROWTH

## 2020-11-11 ENCOUNTER — Ambulatory Visit
Admission: RE | Admit: 2020-11-11 | Discharge: 2020-11-11 | Disposition: A | Discharge status: Home / Self Care | Source: Ambulatory Visit | Attending: Nurse Practitioner | Admitting: Nurse Practitioner

## 2020-11-11 ENCOUNTER — Other Ambulatory Visit: Payer: Self-pay

## 2020-11-11 DIAGNOSIS — D0512 Intraductal carcinoma in situ of left breast: Secondary | ICD-10-CM

## 2020-11-11 MED ORDER — GADOBUTROL 1 MMOL/ML IV SOLN
5.0000 mL | Freq: Once | INTRAVENOUS | Status: AC | PRN
  Administered 2020-11-11: 5 mL via INTRAVENOUS

## 2021-01-27 ENCOUNTER — Telehealth: Payer: Self-pay | Admitting: Nurse Practitioner

## 2021-01-27 NOTE — Telephone Encounter (Signed)
Rescheduled upcoming appointment due to provider's template. Patient is aware of changes. 

## 2021-02-22 ENCOUNTER — Encounter: Payer: Self-pay | Admitting: Nurse Practitioner

## 2021-02-22 ENCOUNTER — Other Ambulatory Visit: Payer: Self-pay

## 2021-02-22 ENCOUNTER — Ambulatory Visit: Admitting: Nurse Practitioner

## 2021-02-22 VITALS — BP 118/80 | HR 90 | Temp 98.2°F | Ht 66.0 in | Wt 121.0 lb

## 2021-02-22 DIAGNOSIS — R7309 Other abnormal glucose: Secondary | ICD-10-CM | POA: Diagnosis not present

## 2021-02-22 DIAGNOSIS — Z139 Encounter for screening, unspecified: Secondary | ICD-10-CM

## 2021-02-22 DIAGNOSIS — E7800 Pure hypercholesterolemia, unspecified: Secondary | ICD-10-CM | POA: Diagnosis not present

## 2021-02-22 DIAGNOSIS — Z0000 Encounter for general adult medical examination without abnormal findings: Secondary | ICD-10-CM

## 2021-02-22 DIAGNOSIS — E559 Vitamin D deficiency, unspecified: Secondary | ICD-10-CM | POA: Diagnosis not present

## 2021-02-22 DIAGNOSIS — Z853 Personal history of malignant neoplasm of breast: Secondary | ICD-10-CM

## 2021-02-22 DIAGNOSIS — Z9012 Acquired absence of left breast and nipple: Secondary | ICD-10-CM

## 2021-02-22 DIAGNOSIS — G40209 Localization-related (focal) (partial) symptomatic epilepsy and epileptic syndromes with complex partial seizures, not intractable, without status epilepticus: Secondary | ICD-10-CM

## 2021-02-22 LAB — POCT URINALYSIS DIPSTICK
Bilirubin, UA: NEGATIVE
Blood, UA: NEGATIVE
Glucose, UA: NEGATIVE
Ketones, UA: NEGATIVE
Leukocytes, UA: NEGATIVE
Nitrite, UA: NEGATIVE
Protein, UA: NEGATIVE
Spec Grav, UA: 1.02 (ref 1.010–1.025)
Urobilinogen, UA: 0.2 E.U./dL
pH, UA: 6 (ref 5.0–8.0)

## 2021-02-22 LAB — POCT UA - MICROALBUMIN
Albumin/Creatinine Ratio, Urine, POC: 30
Creatinine, POC: 200 mg/dL
Microalbumin Ur, POC: 10 mg/L

## 2021-02-22 NOTE — Progress Notes (Signed)
I,Yamilka [employer] as a Education administrator for [employer], FNP.,have documented all relevant documentation on the behalf of [name], FNP,as directed by  [name], FNP while in the presence of [name], [place]. This visit occurred during the SARS-CoV-2 public health emergency.  Safety protocols were in place, including screening questions prior to the visit, additional usage of staff PPE, and extensive cleaning of exam room while observing appropriate contact time as indicated for disinfecting solutions.  Subjective:     Patient ID: Sally Nguyen , female    DOB: 20-May-1956 , 65 y.o.   MRN: [id number]   Chief Complaint  Patient presents with  . Annual Exam    HPI  Here for HM.  She is having difficulty with falling asleep but is able to stay asleep once she is sleep. She continues to be seen by Oncology and Neurology.  No reported seizures, no changes with medications.     Past Medical History:  Diagnosis Date  . 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 Seizures ([place])   . Vision abnormalities      Family History  Problem Relation Age of Onset  . Liver cancer Mother   . Colon cancer Mother        27  . Atrial fibrillation Father   . Obesity Sister      Current Outpatient Medications:  .  Calcium Citrate (CITRACAL PO), Take by mouth. +vit d, Disp: , Rfl:  .  cetirizine (ZYRTEC) 10 MG tablet, Take 10 mg by mouth daily., Disp: , Rfl:  .  cholecalciferol (VITAMIN D) 1000 units tablet, Take 2,000 Units by mouth daily., Disp: , Rfl:  .  gabapentin (NEURONTIN) 100 MG capsule, TAKE 1 OR 2 CAPSULES AT BEDTIME, Disp: 180 capsule, Rfl: 4 .  Oxcarbazepine (TRILEPTAL) 300 MG tablet, Take 1 and 1/2 tablet in the morning and 2 tablets in the evening., Disp: 360 tablet, Rfl: 4 .  UNABLE TO FIND, 500 mg. Med Name: Tumeric Curcumin, Disp: , Rfl:  .  zinc gluconate 50 MG tablet, Take 50 mg by mouth daily., Disp: , Rfl:    Allergies  Allergen Reactions  . Penicillins Rash       The patient states she is post menopausal status.   Negative for Dysmenorrhea and Negative for Menorrhagia.  Negative for: breast discharge, breast lump(s), breast pain and breast self exam. Associated symptoms include abnormal vaginal bleeding. Pertinent negatives include abnormal bleeding (hematology), anxiety, decreased libido, depression, difficulty falling sleep, dyspareunia, history of infertility, nocturia, sexual dysfunction, sleep disturbances, urinary incontinence, urinary urgency, vaginal discharge and vaginal itching. Diet regular. The patient states her exercise level is minimal, mostly doing activities within the house.   The patient's tobacco use is:  Social History   Tobacco Use  Smoking Status Never Smoker  Smokeless Tobacco Never Used   She has been exposed to passive smoke. The patient's alcohol use is:  Social History   Substance and Sexual Activity  Alcohol Use No   Additional information: Last pap 3/ 2020, next one scheduled for 02/2022.    Review of Systems  Constitutional: Negative.   HENT: Negative.   Eyes: Negative.   Respiratory: Negative.   Cardiovascular: Negative.   Gastrointestinal: Negative.   Endocrine: Negative.   Genitourinary: Negative.   Musculoskeletal: Negative.   Skin: Negative.   Allergic/Immunologic: Negative.   Neurological: Negative.   Hematological: Negative.   Psychiatric/Behavioral: Negative.      Today's Vitals   02/22/21 1004  BP: 118/80  Pulse: 90  Temp: 98.2 F (36.8 C)  TempSrc: Oral  Weight: 121 lb (54.9 kg)  Height: $Remove'5\' 6"'NXmdaTi$  (1.676 m)  PainSc: 0-No pain   Body mass index is 19.53 kg/m.   Objective:  Physical Exam Constitutional:      General: She is not in acute distress.    Appearance: Normal appearance. She is well-developed and normal weight.  HENT:     Head: Normocephalic and atraumatic.     Right Ear: Hearing, tympanic membrane, ear canal and external ear normal. There is no impacted cerumen.     Left  Ear: Hearing, tympanic membrane, ear canal and external ear normal. There is no impacted cerumen.     Nose:     Comments: Deferred - masked    Mouth/Throat:     Comments: Deferred - masked Eyes:     General: Lids are normal.     Extraocular Movements: Extraocular movements intact.     Conjunctiva/sclera: Conjunctivae normal.     Pupils: Pupils are equal, round, and reactive to light.     Funduscopic exam:    Right eye: No papilledema.        Left eye: No papilledema.  Neck:     Thyroid: No thyroid mass.     Vascular: No carotid bruit.  Cardiovascular:     Rate and Rhythm: Normal rate and regular rhythm.     Pulses: Normal pulses.     Heart sounds: Normal heart sounds. No murmur heard.   Pulmonary:     Effort: Pulmonary effort is normal.     Breath sounds: Normal breath sounds.  Chest:     Chest wall: No mass.  Breasts:     Tanner Score is 5.     Right: Normal. No mass, tenderness, axillary adenopathy or supraclavicular adenopathy.     Left: Normal. No mass, tenderness, axillary adenopathy or supraclavicular adenopathy.    Abdominal:     General: Abdomen is flat. Bowel sounds are normal. There is no distension.     Palpations: Abdomen is soft.     Tenderness: There is no abdominal tenderness.  Genitourinary:    Rectum: Guaiac result negative.  Musculoskeletal:        General: No swelling. Normal range of motion.     Cervical back: Full passive range of motion without pain, normal range of motion and neck supple.     Right lower leg: No edema.     Left lower leg: No edema.  Lymphadenopathy:     Upper Body:     Right upper body: No supraclavicular, axillary or pectoral adenopathy.     Left upper body: No supraclavicular, axillary or pectoral adenopathy.  Skin:    General: Skin is warm and dry.     Capillary Refill: Capillary refill takes less than 2 seconds.  Neurological:     General: No focal deficit present.     Mental Status: She is alert and oriented to person,  place, and time.     Cranial Nerves: No cranial nerve deficit.     Sensory: No sensory deficit.  Psychiatric:        Mood and Affect: Mood normal.        Behavior: Behavior normal.        Thought Content: Thought content normal.        Judgment: Judgment normal.         Assessment And Plan:     1. Encounter for general adult medical examination  w/o abnormal findings . Behavior modifications discussed and diet history reviewed.   . Pt will continue to exercise regularly and modify diet with low GI, plant based foods and decrease intake of processed foods.  . Recommend intake of daily multivitamin, Vitamin D, and calcium.  . Recommend mammogram and colonoscopy (will follow up on when she is due for her next colonoscopy with Dr. Collene Mares) for preventive screenings, as well as recommend immunizations that include influenza (declined), TDAP, and Shingles - CBC  2. History of breast cancer  Continues to be followed by Oncology  3. H/O left mastectomy  4. Encounter for screening - ABO AND RH   5. Abnormal glucose  Chronic, stable  Will check levels today - POCT Urinalysis Dipstick (81002) - POCT UA - Microalbumin - CMP14+EGFR - Hemoglobin A1c  6. Vitamin D deficiency  Will check vitamin D level and supplement as needed.     Also encouraged to spend 15 minutes in the sun daily.  - VITAMIN D 25 Hydroxy (Vit-D Deficiency, Fractures)  7. Elevated cholesterol  Chronic, slightly elevated LDL  Encouraged to avoid fried and fatty foods  No current medications - Lipid panel  8. Partial symptomatic epilepsy with complex partial seizures, not intractable, without status epilepticus ([place])  No recent seizure activity  Continue follow up with Neurology     Patient was given opportunity to ask questions. Patient verbalized understanding of the plan and was able to repeat key elements of the plan. All questions were answered to their satisfaction.   [name], FNP   I,  [name], FNP, have reviewed all documentation for this visit. The documentation on 02/22/21 for the exam, diagnosis, procedures, and orders are all accurate and complete.   THE PATIENT IS ENCOURAGED TO PRACTICE SOCIAL DISTANCING DUE TO THE COVID-19 PANDEMIC.

## 2021-02-22 NOTE — Patient Instructions (Addendum)
Health Maintenance, Female Adopting a healthy lifestyle and getting preventive care are important in promoting health and wellness. Ask your health care provider about:  The right schedule for you to have regular tests and exams.  Things you can do on your own to prevent diseases and keep yourself healthy. What should I know about diet, weight, and exercise? Eat a healthy diet  Eat a diet that includes plenty of vegetables, fruits, low-fat dairy products, and lean protein.  Do not eat a lot of foods that are high in solid fats, added sugars, or sodium.   Maintain a healthy weight Body mass index (BMI) is used to identify weight problems. It estimates body fat based on height and weight. Your health care provider can help determine your BMI and help you achieve or maintain a healthy weight. Get regular exercise Get regular exercise. This is one of the most important things you can do for your health. Most adults should:  Exercise for at least 150 minutes each week. The exercise should increase your heart rate and make you sweat (moderate-intensity exercise).  Do strengthening exercises at least twice a week. This is in addition to the moderate-intensity exercise.  Spend less time sitting. Even light physical activity can be beneficial. Watch cholesterol and blood lipids Have your blood tested for lipids and cholesterol at 64 years of age, then have this test every 5 years. Have your cholesterol levels checked more often if:  Your lipid or cholesterol levels are high.  You are older than 64 years of age.  You are at high risk for heart disease. What should I know about cancer screening? Depending on your health history and family history, you may need to have cancer screening at various ages. This may include screening for:  Breast cancer.  Cervical cancer.  Colorectal cancer.  Skin cancer.  Lung cancer. What should I know about heart disease, diabetes, and high blood  pressure? Blood pressure and heart disease  High blood pressure causes heart disease and increases the risk of stroke. This is more likely to develop in people who have high blood pressure readings, are of African descent, or are overweight.  Have your blood pressure checked: ? Every 3-5 years if you are 18-39 years of age. ? Every year if you are 40 years old or older. Diabetes Have regular diabetes screenings. This checks your fasting blood sugar level. Have the screening done:  Once every three years after age 40 if you are at a normal weight and have a low risk for diabetes.  More often and at a younger age if you are overweight or have a high risk for diabetes. What should I know about preventing infection? Hepatitis B If you have a higher risk for hepatitis B, you should be screened for this virus. Talk with your health care provider to find out if you are at risk for hepatitis B infection. Hepatitis C Testing is recommended for:  Everyone born from 1945 through 1965.  Anyone with known risk factors for hepatitis C. Sexually transmitted infections (STIs)  Get screened for STIs, including gonorrhea and chlamydia, if: ? You are sexually active and are younger than 64 years of age. ? You are older than 64 years of age and your health care provider tells you that you are at risk for this type of infection. ? Your sexual activity has changed since you were last screened, and you are at increased risk for chlamydia or gonorrhea. Ask your health care provider   if you are at risk.  Ask your health care provider about whether you are at high risk for HIV. Your health care provider may recommend a prescription medicine to help prevent HIV infection. If you choose to take medicine to prevent HIV, you should first get tested for HIV. You should then be tested every 3 months for as long as you are taking the medicine. Pregnancy  If you are about to stop having your period (premenopausal) and  you may become pregnant, seek counseling before you get pregnant.  Take 400 to 800 micrograms (mcg) of folic acid every day if you become pregnant.  Ask for birth control (contraception) if you want to prevent pregnancy. Osteoporosis and menopause Osteoporosis is a disease in which the bones lose minerals and strength with aging. This can result in bone fractures. If you are 40 years old or older, or if you are at risk for osteoporosis and fractures, ask your health care provider if you should:  Be screened for bone loss.  Take a calcium or vitamin D supplement to lower your risk of fractures.  Be given hormone replacement therapy (HRT) to treat symptoms of menopause. Follow these instructions at home: Lifestyle  Do not use any products that contain nicotine or tobacco, such as cigarettes, e-cigarettes, and chewing tobacco. If you need help quitting, ask your health care provider.  Do not use street drugs.  Do not share needles.  Ask your health care provider for help if you need support or information about quitting drugs. Alcohol use  Do not drink alcohol if: ? Your health care provider tells you not to drink. ? You are pregnant, may be pregnant, or are planning to become pregnant.  If you drink alcohol: ? Limit how much you use to 0-1 drink a day. ? Limit intake if you are breastfeeding.  Be aware of how much alcohol is in your drink. In the [place] one drink equals one 12 oz bottle of beer (355 mL), one 5 oz glass of wine (148 mL), or one 1 oz glass of hard liquor (44 mL). General instructions  Schedule regular health, dental, and eye exams.  Stay current with your vaccines.  Tell your health care provider if: ? You often feel depressed. ? You have ever been abused or do not feel safe at home. Summary  Adopting a healthy lifestyle and getting preventive care are important in promoting health and wellness.  Follow your health care provider's instructions about healthy  diet, exercising, and getting tested or screened for diseases.  Follow your health care provider's instructions on monitoring your cholesterol and blood pressure. This information is not intended to replace advice given to you by your health care provider. Make sure you discuss any questions you have with your health care provider. Document Revised: 11/13/2018 Document Reviewed: 11/13/2018 Elsevier Patient Education  2021 [place].  Make sure you have good sleep habits, no technology within an hour of going to sleep You can take Calm, aswaghanda is good for sleep and anxiety, you can also take melatonin all of these are over the counter and natural.

## 2021-02-23 LAB — CMP14+EGFR
ALT: 9 IU/L (ref 0–32)
AST: 10 IU/L (ref 0–40)
Albumin/Globulin Ratio: 1.7 (ref 1.2–2.2)
Albumin: 4.6 g/dL (ref 3.8–4.8)
Alkaline Phosphatase: 73 IU/L (ref 44–121)
BUN/Creatinine Ratio: 17 (ref 12–28)
BUN: 16 mg/dL (ref 8–27)
Bilirubin Total: 0.3 mg/dL (ref 0.0–1.2)
CO2: 24 mmol/L (ref 20–29)
Calcium: 9.9 mg/dL (ref 8.7–10.3)
Chloride: 102 mmol/L (ref 96–106)
Creatinine, Ser: 0.93 mg/dL (ref 0.57–1.00)
Globulin, Total: 2.7 g/dL (ref 1.5–4.5)
Glucose: 77 mg/dL (ref 65–99)
Potassium: 4.4 mmol/L (ref 3.5–5.2)
Sodium: 142 mmol/L (ref 134–144)
Total Protein: 7.3 g/dL (ref 6.0–8.5)
eGFR: 69 mL/min/{1.73_m2} (ref >59)

## 2021-02-23 LAB — VITAMIN D 25 HYDROXY (VIT D DEFICIENCY, FRACTURES): Vit D, 25-Hydroxy: 33.3 ng/mL (ref 30.0–100.0)

## 2021-02-23 LAB — HEMOGLOBIN A1C
Est. average glucose Bld gHb Est-mCnc: 120 mg/dL
Hgb A1c MFr Bld: 5.8 % — ABNORMAL HIGH (ref 4.8–5.6)

## 2021-02-23 LAB — CBC
Hematocrit: 41.8 % (ref 34.0–46.6)
Hemoglobin: 13.5 g/dL (ref 11.1–15.9)
MCH: 28.7 pg (ref 26.6–33.0)
MCHC: 32.3 g/dL (ref 31.5–35.7)
MCV: 89 fL (ref 79–97)
Platelets: 283 10*3/uL (ref 150–450)
RBC: 4.7 x10E6/uL (ref 3.77–5.28)
RDW: 12.7 % (ref 11.7–15.4)
WBC: 4.8 10*3/uL (ref 3.4–10.8)

## 2021-02-23 LAB — ABO AND RH: Rh Factor: POSITIVE

## 2021-02-24 LAB — LIPID PANEL
Chol/HDL Ratio: 3 ratio (ref 0.0–4.4)
Cholesterol, Total: 189 mg/dL (ref 100–199)
HDL: 64 mg/dL (ref >39)
LDL Chol Calc (NIH): 109 mg/dL — ABNORMAL HIGH (ref 0–99)
Triglycerides: 90 mg/dL (ref 0–149)
VLDL Cholesterol Cal: 16 mg/dL (ref 5–40)

## 2021-02-24 LAB — SPECIMEN STATUS REPORT

## 2021-04-11 ENCOUNTER — Other Ambulatory Visit: Payer: Self-pay | Admitting: Nurse Practitioner

## 2021-04-11 DIAGNOSIS — Z1231 Encounter for screening mammogram for malignant neoplasm of breast: Secondary | ICD-10-CM

## 2021-05-03 ENCOUNTER — Ambulatory Visit: Admitting: Family Medicine

## 2021-05-03 ENCOUNTER — Encounter: Payer: Self-pay | Admitting: Family Medicine

## 2021-05-03 ENCOUNTER — Other Ambulatory Visit: Payer: Self-pay

## 2021-05-03 VITALS — BP 100/65 | HR 118 | Ht 66.0 in | Wt 118.0 lb

## 2021-05-03 DIAGNOSIS — G4721 Circadian rhythm sleep disorder, delayed sleep phase type: Secondary | ICD-10-CM

## 2021-05-03 DIAGNOSIS — G40209 Localization-related (focal) (partial) symptomatic epilepsy and epileptic syndromes with complex partial seizures, not intractable, without status epilepticus: Secondary | ICD-10-CM

## 2021-05-03 MED ORDER — OXCARBAZEPINE 300 MG PO TABS: ORAL_TABLET | ORAL | 4 refills | Status: DC

## 2021-05-03 MED ORDER — GABAPENTIN 100 MG PO CAPS: ORAL_CAPSULE | ORAL | 4 refills | Status: DC

## 2021-05-03 NOTE — Patient Instructions (Signed)
Below is our plan:  We will continue current plan. I am so sorry to hear of your husband's passing. Please let me know if you need anything!  Please make sure you are staying well hydrated. I recommend 50-60 ounces daily. Well balanced diet and regular exercise encouraged. Consistent sleep schedule with 6-8 hours recommended.   Please continue follow up with care team as directed.   Follow up with me in 1 year   You may receive a survey regarding today's visit. I encourage you to leave honest feed back as I do use this information to improve patient care. Thank you for seeing me today!      Quality Sleep Information, Adult Quality sleep is important for your mental and physical health. It also improves your quality of life. Quality sleep means you:  Are asleep for most of the time you are in bed.  Fall asleep within 30 minutes.  Wake up no more than once a night.  Are awake for no longer than 20 minutes if you do wake up during the night. Most adults need 7-8 hours of quality sleep each night. How can poor sleep affect me? If you do not get enough quality sleep, you may have:  Mood swings.  Daytime sleepiness.  Confusion.  Decreased reaction time.  Sleep disorders, such as insomnia and sleep apnea.  Difficulty with: ? Solving problems. ? Coping with stress. ? Paying attention. These issues may affect your performance and productivity at work, school, and at home. Lack of sleep may also put you at higher risk for accidents, suicide, and risky behaviors. If you do not get quality sleep you may also be at higher risk for several health problems, including:  Infections.  Type 2 diabetes.  Heart disease.  High blood pressure.  Obesity.  Worsening of long-term conditions, like arthritis, kidney disease, depression, Parkinson's disease, and epilepsy. What actions can I take to get more quality sleep?  Stick to a sleep schedule. Go to sleep and wake up at about  the same time each day. Do not try to sleep less on weekdays and make up for lost sleep on weekends. This does not work.  Try to get about 30 minutes of exercise on most days. Do not exercise 2-3 hours before going to bed.  Limit naps during the day to 30 minutes or less.  Do not use any products that contain nicotine or tobacco, such as cigarettes or e-cigarettes. If you need help quitting, ask your health care provider.  Do not drink caffeinated beverages for at least 8 hours before going to bed. Coffee, tea, and some sodas contain caffeine.  Do not drink alcohol close to bedtime.  Do not eat large meals close to bedtime.  Do not take naps in the late afternoon.  Try to get at least 30 minutes of sunlight every day. Morning sunlight is best.  Make time to relax before bed. Reading, listening to music, or taking a hot bath promotes quality sleep.  Make your bedroom a place that promotes quality sleep. Keep your bedroom dark, quiet, and at a comfortable room temperature. Make sure your bed is comfortable. Take out sleep distractions like TV, a computer, smartphone, and bright lights.  If you are lying awake in bed for longer than 20 minutes, get up and do a relaxing activity until you feel sleepy.  Work with your health care provider to treat medical conditions that may affect sleeping, such as: ? Nasal obstruction. ? Snoring. ?  Sleep apnea and other sleep disorders.  Talk to your health care provider if you think any of your prescription medicines may cause you to have difficulty falling or staying asleep.  If you have sleep problems, talk with a sleep consultant. If you think you have a sleep disorder, talk with your health care provider about getting evaluated by a specialist.      Where to find more information  [place] website: https://sleepfoundation.org  National Heart, Lung, and [place] ([place]):  http://www.saunders.info/.pdf  Centers for Disease Control and Prevention (CDC): LearningDermatology.pl Contact a health care provider if you:  Have trouble getting to sleep or staying asleep.  Often wake up very early in the morning and cannot get back to sleep.  Have daytime sleepiness.  Have daytime sleep attacks of suddenly falling asleep and sudden muscle weakness (narcolepsy).  Have a tingling sensation in your legs with a strong urge to move your legs (restless legs syndrome).  Stop breathing briefly during sleep (sleep apnea).  Think you have a sleep disorder or are taking a medicine that is affecting your quality of sleep. Summary  Most adults need 7-8 hours of quality sleep each night.  Getting enough quality sleep is an important part of health and well-being.  Make your bedroom a place that promotes quality sleep and avoid things that may cause you to have poor sleep, such as alcohol, caffeine, smoking, and large meals.  Talk to your health care provider if you have trouble falling asleep or staying asleep. This information is not intended to replace advice given to you by your health care provider. Make sure you discuss any questions you have with your health care provider. Document Revised: 02/27/2018 Document Reviewed: 02/27/2018 Elsevier Patient Education  2021 [place].    Seizure, Adult A seizure is a sudden burst of abnormal electrical and chemical activity in the brain. Seizures usually last from 30 seconds to 2 minutes.  What are the causes? Common causes of this condition include:  Fever or infection.  Problems that affect the brain. These may include: ? A brain or head injury. ? Bleeding in the brain. ? A brain tumor.  Low levels of blood sugar or salt.  Kidney problems or liver problems.  Conditions that are passed from parent to child (are inherited).  Problems with a substance, such as: ? Having a  reaction to a drug or a medicine. ? Stopping the use of a substance all of a sudden (withdrawal).  A stroke.  Disorders that affect how you develop. Sometimes, the cause may not be known.  What increases the risk?  Having someone in your family who has epilepsy. In this condition, seizures happen again and again over time. They have no clear cause.  Having had a tonic-clonic seizure before. This type of seizure causes you to: ? Tighten the muscles of the whole body. ? Lose consciousness.  Having had a head injury or strokes before.  Having had a lack of oxygen at birth. What are the signs or symptoms? There are many types of seizures. The symptoms vary depending on the type of seizure you have. Symptoms during a seizure  Shaking that you cannot control (convulsions) with fast, jerky movements of muscles.  Stiffness of the body.  Breathing problems.  Feeling mixed up (confused).  Staring or not responding to sound or touch.  Head nodding.  Eyes that blink, flutter, or move fast.  Drooling, grunting, or making clicking sounds with your mouth  Losing control of when you pee or poop. Symptoms before a seizure  Feeling afraid, nervous, or worried.  Feeling like you may vomit.  Feeling like: ? You are moving when you are not. ? Things around you are moving when they are not.  Feeling like you saw or heard something before (dj vu).  Odd tastes or smells.  Changes in how you see. You may see flashing lights or spots. Symptoms after a seizure  Feeling confused.  Feeling sleepy.  Headache.  Sore muscles. How is this treated? If your seizure stops on its own, you will not need treatment. If your seizure lasts longer than 5 minutes, you will normally need treatment. Treatment may include:  Medicines given through an IV tube.  Avoiding things, such as medicines, that are known to cause your seizures.  Medicines to prevent seizures.  A device to prevent or  control seizures.  Surgery.  A diet low in carbohydrates and high in fat (ketogenic diet). Follow these instructions at home: Medicines  Take over-the-counter and prescription medicines only as told by your doctor.  Avoid foods or drinks that may keep your medicine from working, such as alcohol. Activity  Follow instructions about driving, swimming, or doing things that would be dangerous if you had another seizure. Wait until your doctor says it is safe for you to do these things.  If you live in the [place] ask your local department of motor vehicles when you can drive.  Get a lot of rest. Teaching others  Teach friends and family what to do when you have a seizure. They should: ? Help you get down to the ground. ? Protect your head and body. ? Loosen any clothing around your neck. ? Turn you on your side. ? Know whether or not you need emergency care. ? Stay with you until you are better.  Also, tell them what not to do if you have a seizure. Tell them: ? They should not hold you down. ? They should not put anything in your mouth.   General instructions  Avoid anything that gives you seizures.  Keep a seizure diary. Write down: ? What you remember about each seizure. ? What you think caused each seizure.  Keep all follow-up visits. Contact a doctor if:  You have another seizure or seizures. Call the doctor each time you have a seizure.  The pattern of your seizures changes.  You keep having seizures with treatment.  You have symptoms of being sick or having an infection.  You are not able to take your medicine. Get help right away if:  You have any of these problems: ? A seizure that lasts longer than 5 minutes. ? Many seizures in a row and you do not feel better between seizures. ? A seizure that makes it harder to breathe. ? A seizure and you can no longer speak or use part of your body.  You do not wake up right after a seizure.  You get hurt during a  seizure.  You feel confused or have pain right after a seizure. These symptoms may be an emergency. Get help right away. Call your local emergency services (911 in the [place]  Do not wait to see if the symptoms will go away.  Do not drive yourself to the hospital. Summary  A seizure is a sudden burst of abnormal electrical and chemical activity in the brain. Seizures normally last from 30 seconds to 2 minutes.  Causes of seizures include  illness, injury to the head, low levels of blood sugar or salt, and certain conditions.  Most seizures will stop on their own in less than 5 minutes. Seizures that last longer than 5 minutes are a medical emergency and need treatment right away.  Many medicines are used to treat seizures. Take over-the-counter and prescription medicines only as told by your doctor. This information is not intended to replace advice given to you by your health care provider. Make sure you discuss any questions you have with your health care provider. Document Revised: 05/28/2020 Document Reviewed: 05/28/2020 Elsevier Patient Education  [place].

## 2021-05-03 NOTE — Progress Notes (Signed)
I have read the note, and I agree with the clinical assessment and plan.  Sawyer Mentzer A. Carmilla Granville, MD, PhD, FAAN Certified in Neurology, Clinical Neurophysiology, Sleep Medicine, Pain Medicine and Neuroimaging  [facility] [address], [address] [place], [place] [address] [phone]  

## 2021-05-03 NOTE — Progress Notes (Signed)
PATIENT: Sally Nguyen DOB: 30-Sep-1956  REASON FOR VISIT: follow up HISTORY FROM: patient  Chief Complaint  Patient presents with  . Follow-up    RM 1, alone. Pt reports doing well with no new SX. Lost husband 2 months ago, pt states after loss and stress she still seems to be doing fine.      HISTORY OF PRESENT ILLNESS: 05/03/21 ALL:  Sally Nguyen returns for follow up for seizures. She continues oxcarbazepine 450mg  in am and 600mg  in pm. She also continues gabapentin 100-200mg  at bedtime for delayed sleep phase. She feels that she is doing well. She lost her husband in April and feels stress levels have been higher but she is doing ok. No seizure activity. She has not slept as well but feels she is getting back into a normal schedule. She is followed regularly by PCP. She continues vitamin D and calcium supplements.    04/27/2020 ALL:  Sally Nguyen is a 65 y.o. female here today for follow up for seizures and delayed sleep phase. She continues oxcarbamazepine 450mg  in am and 600mg  in pm. Last seizure in 2004. She continues gabapentin 100-200mg  at bedtime which helps with sleep. She was diagnosed with left sided breast cancer. She had a left mastectomy on 06/19/2019 but did not require any additional treatment. She is followed regularly by oncology. Labs with PCP normal in 02/2020.   HISTORY: (copied from Dr Garth Bigness note on 05/14/2019)  She denies any more seizures.  Her last seizure was in 2004.  Prior to that, she was having about 1-2 seizures per year.  She tolerates the oxcarbazepine well. Current dose is 450 mg qAM and 600 mg qHS. In 2018, the blood level was low normal.  Her last EEG was in 2017 and showed hypersynchronous activity in the right temporal region that could be consistent with a seizure disorder.  An EEG in 2016 showed a left temporal focus.  She reports that most EEGs have shown some abnormality.  Her second issue is a mild sleep disturbance.  She has delayed phase  sleep syndrome usually going to bed around 2 AM hours of sleep.  When she falls asleep she does well.  Adding the gabapentin before bedtime has helped her fall asleep and stay asleep better.Marland Kitchen   REVIEW OF SYSTEMS: Out of a complete 14 system review of symptoms, the patient complains only of the following symptoms, insomnia and all other reviewed systems are negative.   ALLERGIES: Allergies  Allergen Reactions  . Penicillins Rash    HOME MEDICATIONS: Outpatient Medications Prior to Visit  Medication Sig Dispense Refill  . Calcium Citrate (CITRACAL PO) Take by mouth. +vit d    . cetirizine (ZYRTEC) 10 MG tablet Take 10 mg by mouth daily.    . cholecalciferol (VITAMIN D) 1000 units tablet Take 2,000 Units by mouth daily.    Marland Kitchen UNABLE TO FIND 500 mg. Med Name: Tumeric Curcumin    . zinc gluconate 50 MG tablet Take 50 mg by mouth daily.    Marland Kitchen gabapentin (NEURONTIN) 100 MG capsule TAKE 1 OR 2 CAPSULES AT BEDTIME 180 capsule 4  . Oxcarbazepine (TRILEPTAL) 300 MG tablet Take 1 and 1/2 tablet in the morning and 2 tablets in the evening. 360 tablet 4   No facility-administered medications prior to visit.    PAST MEDICAL HISTORY: Past Medical History:  Diagnosis Date  . 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 Seizures ([place])   . Vision abnormalities  PAST SURGICAL HISTORY: Past Surgical History:  Procedure Laterality Date  . COLONOSCOPY    . MASTECTOMY Left   . MASTECTOMY W/ SENTINEL NODE BIOPSY Left 06/19/2019   Procedure: LEFT MASTECTOMY WITH SENTINEL LYMPH NODE BIOPSY;  Surgeon: Stark Klein, MD;  Location: [facility];  Service: General;  Laterality: Left;  . WISDOM TOOTH EXTRACTION      FAMILY HISTORY: Family History  Problem Relation Age of Onset  . Liver cancer Mother   . Colon cancer Mother        34  . Atrial fibrillation Father   . Obesity Sister     SOCIAL HISTORY: Social History   Socioeconomic History  . Marital status: Married    Spouse  name: Not on file  . Number of children: 1  . Years of education: Not on file  . Highest education level: Not on file  Occupational History  . Not on file  Tobacco Use  . Smoking status: Never Smoker  . Smokeless tobacco: Never Used  Substance and Sexual Activity  . Alcohol use: No  . Drug use: No  . Sexual activity: Not on file  Other Topics Concern  . Not on file  Social History Narrative  . Not on file   Social Determinants of Health   Financial Resource Strain: Not on file  Food Insecurity: Not on file  Transportation Needs: Not on file  Physical Activity: Not on file  Stress: Not on file  Social Connections: Not on file  Intimate Partner Violence: Not on file      PHYSICAL EXAM  Vitals:   05/03/21 1252  BP: 100/65  Pulse: (!) 118  Weight: 118 lb (53.5 kg)  Height: 5\' 6"  (1.676 m)   Body mass index is 19.05 kg/m.  Generalized: Well developed, in no acute distress  Cardiology: normal rate and rhythm, no murmur noted Respiratory: clear to auscultation bilaterally  Neurological examination  Mentation: Alert oriented to time, place, history taking. Follows all commands speech and language fluent Cranial nerve II-XII: Pupils were equal round reactive to light. Extraocular movements were full, visual field were full on confrontational test. Facial sensation and strength were normal. Head turning and shoulder shrug  were normal and symmetric. Motor: The motor testing reveals 5 over 5 strength of all 4 extremities. Good symmetric motor tone is noted throughout.  Sensory: Sensory testing is intact to soft touch on all 4 extremities. No evidence of extinction is noted.  Coordination: Cerebellar testing reveals good finger-nose-finger and heel-to-shin bilaterally.  Gait and station: Gait is normal.   DIAGNOSTIC DATA (LABS, IMAGING, TESTING) - I reviewed patient records, labs, notes, testing and imaging myself where available.  No flowsheet data found.   Lab Results   Component Value Date   WBC 4.8 02/22/2021   HGB 13.5 02/22/2021   HCT 41.8 02/22/2021   MCV 89 02/22/2021   PLT 283 02/22/2021      Component Value Date/Time   NA 142 02/22/2021 1048   K 4.4 02/22/2021 1048   CL 102 02/22/2021 1048   CO2 24 02/22/2021 1048   GLUCOSE 77 02/22/2021 1048   GLUCOSE 66 (L) 07/12/2020 1049   BUN 16 02/22/2021 1048   CREATININE 0.93 02/22/2021 1048   CREATININE 0.83 07/12/2020 1049   CALCIUM 9.9 02/22/2021 1048   PROT 7.3 02/22/2021 1048   ALBUMIN 4.6 02/22/2021 1048   AST 10 02/22/2021 1048   AST 11 (L) 07/12/2020 1049   ALT 9 02/22/2021 1048  ALT 8 07/12/2020 1049   ALKPHOS 73 02/22/2021 1048   BILITOT 0.3 02/22/2021 1048   BILITOT 0.3 07/12/2020 1049   GFRNONAA >60 07/12/2020 1049   GFRAA >60 07/12/2020 1049   Lab Results  Component Value Date   CHOL 189 02/22/2021   HDL 64 02/22/2021   LDLCALC 109 (H) 02/22/2021   TRIG 90 02/22/2021   CHOLHDL 3.0 02/22/2021   Lab Results  Component Value Date   HGBA1C 5.8 (H) 02/22/2021   No results found for: VITAMINB12 No results found for: TSH     ASSESSMENT AND PLAN 65 y.o. year old female  has a past medical history of Cancer ([facility]) (05/2019), Seizures ([place]), and Vision abnormalities. here with     ICD-10-CM   1. Partial symptomatic epilepsy with complex partial seizures, not intractable, without status epilepticus (HCC)  G40.209 10-Hydroxycarbazepine  2. 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  She continues oxcarbamazepine 450 mg in the morning and 600 mg in the evenings.  No recent seizure activity.  Gabapentin 1 to 200 mg in the evenings helps with delayed sleep phase.  She is tolerating medications well with no obvious adverse effects.  We will continue current therapy.  Lab work has been reviewed in epic. I will update oxcarbazepine levels, today. She was encouraged to stay well-hydrated, focus on a well-balanced diet and regular exercise.  She will  continue close follow-up with primary care and oncology. She will follow-up with me in 1 year, sooner if needed.  She verbalizes understanding and agreement with this plan.   Orders Placed This Encounter  Procedures  . 10-Hydroxycarbazepine     Meds ordered this encounter  Medications  . Oxcarbazepine (TRILEPTAL) 300 MG tablet    Sig: Take 1 and 1/2 tablet in the morning and 2 tablets in the evening.    Dispense:  360 tablet    Refill:  4    Order Specific Question:   Supervising Provider    Answer:   Melvenia Beam [id number]  . gabapentin (NEURONTIN) 100 MG capsule    Sig: TAKE 1 OR 2 CAPSULES AT BEDTIME    Dispense:  180 capsule    Refill:  4    Order Specific Question:   Supervising Provider    Answer:   Melvenia Beam [[id number]]       ZES PQZRA, FNP-C 05/03/2021, 1:29 PM [facility] [address], [place], [place] [address] [phone]

## 2021-05-05 LAB — 10-HYDROXYCARBAZEPINE: Oxcarbazepine SerPl-Mcnc: 18 ug/mL (ref 10–35)

## 2021-05-06 ENCOUNTER — Ambulatory Visit

## 2021-05-17 ENCOUNTER — Other Ambulatory Visit: Payer: Self-pay | Admitting: Nurse Practitioner

## 2021-05-17 ENCOUNTER — Ambulatory Visit
Admission: RE | Admit: 2021-05-17 | Discharge: 2021-05-17 | Disposition: A | Discharge status: Home / Self Care | Source: Ambulatory Visit | Attending: Nurse Practitioner | Admitting: Nurse Practitioner

## 2021-05-17 ENCOUNTER — Other Ambulatory Visit: Payer: Self-pay

## 2021-05-17 DIAGNOSIS — Z1231 Encounter for screening mammogram for malignant neoplasm of breast: Secondary | ICD-10-CM

## 2021-06-07 ENCOUNTER — Ambulatory Visit

## 2021-06-22 ENCOUNTER — Telehealth: Payer: Self-pay | Admitting: Nurse Practitioner

## 2021-06-22 NOTE — Telephone Encounter (Signed)
Rescheduled upcoming appointment due to provider's template. Patient is aware of changes. 

## 2021-07-11 ENCOUNTER — Other Ambulatory Visit

## 2021-07-11 ENCOUNTER — Ambulatory Visit: Admitting: Nurse Practitioner

## 2021-07-13 ENCOUNTER — Other Ambulatory Visit

## 2021-07-13 ENCOUNTER — Ambulatory Visit: Admitting: Nurse Practitioner

## 2021-07-14 ENCOUNTER — Encounter: Payer: Self-pay | Admitting: Hematology

## 2021-07-14 ENCOUNTER — Other Ambulatory Visit: Payer: Self-pay

## 2021-07-14 ENCOUNTER — Inpatient Hospital Stay: Attending: Hematology

## 2021-07-14 ENCOUNTER — Inpatient Hospital Stay (HOSPITAL_BASED_OUTPATIENT_CLINIC_OR_DEPARTMENT_OTHER): Admitting: Hematology

## 2021-07-14 VITALS — BP 97/68 | HR 82 | Temp 98.2°F | Resp 18 | Ht 66.0 in | Wt 123.1 lb

## 2021-07-14 DIAGNOSIS — I890 Lymphedema, not elsewhere classified: Secondary | ICD-10-CM | POA: Insufficient documentation

## 2021-07-14 DIAGNOSIS — D0512 Intraductal carcinoma in situ of left breast: Secondary | ICD-10-CM

## 2021-07-14 DIAGNOSIS — Z86000 Personal history of in-situ neoplasm of breast: Secondary | ICD-10-CM | POA: Insufficient documentation

## 2021-07-14 DIAGNOSIS — Z9012 Acquired absence of left breast and nipple: Secondary | ICD-10-CM | POA: Insufficient documentation

## 2021-07-14 DIAGNOSIS — Z79899 Other long term (current) drug therapy: Secondary | ICD-10-CM | POA: Insufficient documentation

## 2021-07-14 DIAGNOSIS — Z923 Personal history of irradiation: Secondary | ICD-10-CM | POA: Insufficient documentation

## 2021-07-14 DIAGNOSIS — G40909 Epilepsy, unspecified, not intractable, without status epilepticus: Secondary | ICD-10-CM | POA: Insufficient documentation

## 2021-07-14 LAB — CBC WITH DIFFERENTIAL/PLATELET
Abs Immature Granulocytes: 0.01 10*3/uL (ref 0.00–0.07)
Basophils Absolute: 0.1 10*3/uL (ref 0.0–0.1)
Basophils Relative: 1 %
Eosinophils Absolute: 0.1 10*3/uL (ref 0.0–0.5)
Eosinophils Relative: 2 %
HCT: 39.8 % (ref 36.0–46.0)
Hemoglobin: 13 g/dL (ref 12.0–15.0)
Immature Granulocytes: 0 %
Lymphocytes Relative: 25 %
Lymphs Abs: 1.2 10*3/uL (ref 0.7–4.0)
MCH: 28.9 pg (ref 26.0–34.0)
MCHC: 32.7 g/dL (ref 30.0–36.0)
MCV: 88.4 fL (ref 80.0–100.0)
Monocytes Absolute: 0.6 10*3/uL (ref 0.1–1.0)
Monocytes Relative: 12 %
Neutro Abs: 2.8 10*3/uL (ref 1.7–7.7)
Neutrophils Relative %: 60 %
Platelets: 257 10*3/uL (ref 150–400)
RBC: 4.5 MIL/uL (ref 3.87–5.11)
RDW: 14.1 % (ref 11.5–15.5)
WBC: 4.6 10*3/uL (ref 4.0–10.5)
nRBC: 0 % (ref 0.0–0.2)

## 2021-07-14 LAB — COMPREHENSIVE METABOLIC PANEL
ALT: 11 U/L (ref 0–44)
AST: 14 U/L — ABNORMAL LOW (ref 15–41)
Albumin: 4 g/dL (ref 3.5–5.0)
Alkaline Phosphatase: 69 U/L (ref 38–126)
Anion gap: 8 (ref 5–15)
BUN: 17 mg/dL (ref 8–23)
CO2: 27 mmol/L (ref 22–32)
Calcium: 9.4 mg/dL (ref 8.9–10.3)
Chloride: 107 mmol/L (ref 98–111)
Creatinine, Ser: 0.87 mg/dL (ref 0.44–1.00)
GFR, Estimated: >60 mL/min (ref >60)
Glucose, Bld: 68 mg/dL — ABNORMAL LOW (ref 70–99)
Potassium: 4 mmol/L (ref 3.5–5.1)
Sodium: 142 mmol/L (ref 135–145)
Total Bilirubin: 0.4 mg/dL (ref 0.3–1.2)
Total Protein: 7.4 g/dL (ref 6.5–8.1)

## 2021-07-14 NOTE — Progress Notes (Signed)
[place]   [phone] [phone]   Clinic Follow up Note   Patient Care Team: [name], Marquez as PCP - General (General Practice) Mauro Kaufmann, RN as Oncology Nurse Navigator Rockwell Germany, RN as Oncology Nurse Navigator Stark Klein, MD as Consulting Physician (General Surgery) Truitt Merle, MD as Consulting Physician (Hematology) Eppie Gibson, MD as Attending Physician (Radiation Oncology) Alla Feeling, NP as Nurse Practitioner (Nurse Practitioner)  Date of Service:  07/14/2021  CHIEF COMPLAINT: f/u of left breast DCIS  SUMMARY OF ONCOLOGIC HISTORY: Oncology History  Ductal carcinoma in situ (DCIS) of left breast  05/08/2019 Mammogram   Mammogram 05/08/19  IMPRESSION: Suspicious microcalcifications over the outer midportion of the left breast spanning 2.4 x 3.3 x 3.9 cm.   05/14/2019 Initial Biopsy   Diagnosis 05/14/19  Breast, left, needle core biopsy, outer mid - DUCTAL CARCINOMA IN SITU WITH CALCIFICATIONS. - SEE COMMENT. Estrogen Receptor: 0%, NEGATIVE Progesterone Receptor: 0%, NEGATIVE   05/14/2019 Cancer Staging   Staging form: Breast, AJCC 8th Edition - Clinical stage from 05/14/2019: Stage Unknown (cTis (DCIS), cNX, cM0, ER-, PR-, HER2: Not Assessed) - Signed by Truitt Merle, MD on 05/20/2019 Cancer Staging Ductal carcinoma in situ (DCIS) of left breast Staging form: Breast, AJCC 8th Edition - Clinical stage from 05/14/2019: Stage Unknown (cTis (DCIS), cNX, cM0, ER-, PR-, HER2: Not Assessed) - Signed by Truitt Merle, MD on 05/20/2019 - Pathologic stage from 06/09/2019: Stage 0 (pTis (DCIS), pN0, cM0, G3, ER-, PR-, HER2: Not Assessed) - Signed by Alla Feeling, NP on 10/15/2019    05/15/2019 Initial Diagnosis   Ductal carcinoma in situ (DCIS) of left breast   05/27/2019 Breast MRI   Breast MRI 05/27/19  IMPRESSION: 1. 3.9 centimeter area of non mass enhancement in the LOWER OUTER QUADRANT of the LEFT breast, correlating well  with the extent of calcifications seen mammographically. The clip is centrally located within this group of calcifications/non mass enhancement. 2. Small possible satellite nodule anterior to the non mass enhancement measuring 0.6 centimeters. Consider MR guided core biopsy of this lesion to define the anterior extent. If biopsy of this lesion is benign and concordant, I would recommend localization of the entire area of calcifications, to include the anterior and posterior components.   06/09/2019 Pathology Results   Diagnosis 06/09/19 Breast, left, needle core biopsy, LOQ - DUCTAL CARCINOMA IN SITU WITH CALCIFICATIONS AND NECROSIS, SEE COMMENT. Results: IMMUNOHISTOCHEMICAL AND MORPHOMETRIC ANALYSIS PERFORMED MANUALLY Estrogen Receptor: 0%, NEGATIVE Progesterone Receptor: 0%, NEGATIVE   06/09/2019 Cancer Staging   Staging form: Breast, AJCC 8th Edition - Pathologic stage from 06/09/2019: Stage 0 (pTis (DCIS), pN0, cM0, G3, ER-, PR-, HER2: Not Assessed) - Signed by Alla Feeling, NP on 10/15/2019   06/19/2019 Surgery   LEFT MASTECTOMY WITH SENTINEL LYMPH NODE BIOPSY by Dr. Barry Dienes  06/19/19    06/19/2019 Pathology Results   Diagnosis 06/19/19 1. Breast, simple mastectomy, Left - DUCTAL CARCINOMA IN SITU, HIGH GRADE, WITH CALCIFICATIONS AND NECROSIS - MARGINS UNINVOLVED BY CARCINOMA (2.5 CM; POSTERIOR) - PREVIOUS BIOPSY SITE CHANGES - SEE ONCOLOGY TABLE AND COMMENT BELOW 2. Lymph node, sentinel, biopsy, Left #1 - NO CARCINOMA IDENTIFIED IN ONE LYMPH NODE (0/1) 3. Lymph node, sentinel, biopsy, Left #2 - NO CARCINOMA IDENTIFIED IN ONE LYMPH NODE (0/1)   10/15/2019 Survivorship   Per [name], NP       CURRENT THERAPY:  Surveillance  INTERVAL HISTORY:  Sally Nguyen is here for a follow  up of DCIS. She was last seen by me on 64/17/20 and by NP Lacie twice in the interim. She presents to the clinic alone. She reports she lost her husband since her last visit. She notes he was  52 and passed from complications following a fall. He was retired Nature conservation officer. She notes they had been married for "just shy of 22 years." She notes her son (from a previous marriage) lives near by. She reports he is going to buried in [facility] next to his late wife, who died 24-25 years ago to cancer.   All other systems were reviewed with the patient and are negative.  MEDICAL HISTORY:  Past Medical History:  Diagnosis Date   Cancer ([place]) 05/2019   left breast DCIS   [name]    Vision abnormalities     SURGICAL HISTORY: Past Surgical History:  Procedure Laterality Date   COLONOSCOPY     MASTECTOMY Left    MASTECTOMY W/ SENTINEL NODE BIOPSY Left 06/19/2019   Procedure: LEFT MASTECTOMY WITH SENTINEL LYMPH NODE BIOPSY;  Surgeon: Stark Klein, MD;  Location: [facility];  Service: General;  Laterality: Left;   WISDOM TOOTH EXTRACTION      I have reviewed the social history and family history with the patient and they are unchanged from previous note.  ALLERGIES:  is allergic to penicillins.  MEDICATIONS:  Current Outpatient Medications  Medication Sig Dispense Refill   Calcium Citrate (CITRACAL PO) Take by mouth. +vit d     cetirizine (ZYRTEC) 10 MG tablet Take 10 mg by mouth daily.     cholecalciferol (VITAMIN D) 1000 units tablet Take 2,000 Units by mouth daily.     gabapentin (NEURONTIN) 100 MG capsule TAKE 1 OR 2 CAPSULES AT BEDTIME 180 capsule 4   Oxcarbazepine (TRILEPTAL) 300 MG tablet Take 1 and 1/2 tablet in the morning and 2 tablets in the evening. 360 tablet 4   UNABLE TO FIND 500 mg. Med Name: Tumeric Curcumin     zinc gluconate 50 MG tablet Take 50 mg by mouth daily.     No current facility-administered medications for this visit.    PHYSICAL EXAMINATION: ECOG PERFORMANCE STATUS: 0 - Asymptomatic  Vitals:   07/14/21 1348  BP: 97/68  Pulse: 82  Resp: 18  Temp: 98.2 F (36.8 C)  SpO2: 100%   Wt Readings from Last 3 Encounters:   07/14/21 123 lb 1.6 oz (55.8 kg)  05/03/21 118 lb (53.5 kg)  02/22/21 121 lb (54.9 kg)     GENERAL:alert, no distress and comfortable SKIN: skin color, texture, turgor are normal, no rashes or significant lesions EYES: normal, Conjunctiva are pink and non-injected, sclera clear  NECK: supple, thyroid normal size, non-tender, without nodularity LYMPH:  no palpable lymphadenopathy in the cervical, axillary  LUNGS: clear to auscultation and percussion with normal breathing effort HEART: regular rate & rhythm and no murmurs and no lower extremity edema ABDOMEN:abdomen soft, non-tender and normal bowel sounds Musculoskeletal:no cyanosis of digits and no clubbing  NEURO: alert & oriented x 3 with fluent speech, no focal motor/sensory deficits BREAST: No palpable mass, nodules or adenopathy bilaterally. Breast exam benign.   LABORATORY DATA:  I have reviewed the data as listed CBC Latest Ref Rng & Units 07/14/2021 02/22/2021 07/12/2020  WBC 4.0 - 10.5 K/uL 4.6 4.8 5.2  Hemoglobin 12.0 - 15.0 g/dL 13.0 13.5 12.3  Hematocrit 36.0 - 46.0 % 39.8 41.8 39.2  Platelets 150 - 400 K/uL 257 283 263  CMP Latest Ref Rng & Units 07/14/2021 02/22/2021 07/12/2020  Glucose 70 - 99 mg/dL 68(L) 77 66(L)  BUN 8 - 23 mg/dL $Remove'17 16 14  'XbXlcck$ Creatinine 0.44 - 1.00 mg/dL 0.87 0.93 0.83  Sodium 135 - 145 mmol/L 142 142 142  Potassium 3.5 - 5.1 mmol/L 4.0 4.4 3.8  Chloride 98 - 111 mmol/L 107 102 108  CO2 22 - 32 mmol/L $RemoveB'27 24 28  'EgbQwKml$ Calcium 8.9 - 10.3 mg/dL 9.4 9.9 9.9  Total Protein 6.5 - 8.1 g/dL 7.4 7.3 7.2  Total Bilirubin 0.3 - 1.2 mg/dL 0.4 0.3 0.3  Alkaline Phos 38 - 126 U/L 69 73 57  AST 15 - 41 U/L 14(L) 10 11(L)  ALT 0 - 44 U/L $Remo'11 9 8      'hIYzC$ RADIOGRAPHIC STUDIES: I have personally reviewed the radiological images as listed and agreed with the findings in the report. No results found.   ASSESSMENT & PLAN:  Sally Nguyen is a 65 y.o. female with   1. Ductal carcinoma in situ (DCIS) of left breast,  Stage 0, pTisN0M0, ER-/PR-, High Grade -Diagnosed in 05/2019. Her Breast MRI showed another left breast mass anterior to the known mass also DCIS, ER/PR negative.  -Given the extent of the cancer in her left breast she underwent left Mastectomy on 06/19/19 with Dr. Barry Dienes.  -post-radiation mastectomy was not recommended  -given ER/PR negative disease, she would not benefit from anti-estrogen therapy  -given her high risk for breast cancer and dense tissue cat C, she is under close surveillance with right mammogram alternating with screening breast MRI 6 months apart -most recent breast MRI 11/11/20 and right screening mammogram on 05/17/21 were both negative. She notes the insurance covered some of the cost of the MRI. -She clinically doing well.  Exam is unremarkable  CBC and CMP are normal. Overall no clinical concern for recurrence -She will continue close surveillance, next breast MRI in 11/2021(OK to do next year if her out of pocket cost is high)  and mammogram in 05/2022.   2. Left breast and arm numbness, Lymphedema -Secondary to surgery  -completed PT -sensation returning unequally throughout the chest wall      3. Seizures/Epilepsy -She is on Trileptal and Gabapentin  -She will continue to follow up with neurologist.     PLAN: -Bilateral breast MRI 11/2021, I ordered today -annual mammogram in 05/2022, I ordered today -f/u with Lacie in 1 year    No problem-specific Assessment & Plan notes found for this encounter.   Orders Placed This Encounter  Procedures   MR BREAST BILATERAL W WO CONTRAST INC CAD    Standing Status:   Future    Standing Expiration Date:   07/14/2022    Order Specific Question:   If indicated for the ordered procedure, I authorize the administration of contrast media per Radiology protocol    Answer:   Yes    Order Specific Question:   What is the patient's sedation requirement?    Answer:   No Sedation    Order Specific Question:   Does the patient have  a pacemaker or implanted devices?    Answer:   No    Order Specific Question:   Radiology Contrast Protocol - do NOT remove file path    Answer:   [email or web address]    Order Specific Question:   Preferred imaging location?    Answer:   [facility] (table limit-550lbs)   MM Digital Screening Unilat R  Standing Status:   Future    Standing Expiration Date:   07/14/2022    Order Specific Question:   Reason for Exam (SYMPTOM  OR DIAGNOSIS REQUIRED)    Answer:   screening    Order Specific Question:   Preferred imaging location?    Answer:   [facility]   All questions were answered. The patient knows to call the clinic with any problems, questions or concerns. No barriers to learning was detected. The total time spent in the appointment was 25 minutes.     Truitt Merle, MD 07/14/2021   I, Wilburn Mylar, am acting as scribe for Truitt Merle, MD.   I have reviewed the above documentation for accuracy and completeness, and I agree with the above.

## 2021-11-15 ENCOUNTER — Other Ambulatory Visit: Payer: Self-pay

## 2021-11-15 ENCOUNTER — Ambulatory Visit
Admission: RE | Admit: 2021-11-15 | Discharge: 2021-11-15 | Disposition: A | Discharge status: Home / Self Care | Source: Ambulatory Visit | Attending: Hematology | Admitting: Hematology

## 2021-11-15 DIAGNOSIS — D0512 Intraductal carcinoma in situ of left breast: Secondary | ICD-10-CM

## 2021-11-15 MED ORDER — GADOBUTROL 1 MMOL/ML IV SOLN
5.0000 mL | Freq: Once | INTRAVENOUS | Status: AC | PRN
  Administered 2021-11-15: 5 mL via INTRAVENOUS

## 2021-11-30 ENCOUNTER — Telehealth: Payer: Self-pay

## 2021-11-30 NOTE — Telephone Encounter (Signed)
Called patient with results of MRI.  She was happy with the results and will schedule her mammogram and her MRI after the new year.Gardiner Rhyme, RN

## 2021-11-30 NOTE — Telephone Encounter (Signed)
-----   Message from Truitt Merle, MD sent at 11/28/2021 12:04 AM EST ----- Please let pt know her results, no concerns, thx   Truitt Merle

## 2022-02-23 ENCOUNTER — Other Ambulatory Visit: Payer: Self-pay

## 2022-02-23 ENCOUNTER — Ambulatory Visit (INDEPENDENT_AMBULATORY_CARE_PROVIDER_SITE_OTHER): Admitting: Nurse Practitioner

## 2022-02-23 ENCOUNTER — Encounter: Payer: Self-pay | Admitting: Nurse Practitioner

## 2022-02-23 ENCOUNTER — Other Ambulatory Visit (HOSPITAL_COMMUNITY)
Admission: RE | Admit: 2022-02-23 | Discharge: 2022-02-23 | Disposition: A | Discharge status: Home / Self Care | Source: Ambulatory Visit | Attending: Nurse Practitioner | Admitting: Nurse Practitioner

## 2022-02-23 VITALS — BP 122/60 | HR 98 | Temp 98.2°F | Ht 66.0 in | Wt 128.4 lb

## 2022-02-23 DIAGNOSIS — E7800 Pure hypercholesterolemia, unspecified: Secondary | ICD-10-CM | POA: Diagnosis not present

## 2022-02-23 DIAGNOSIS — Z1211 Encounter for screening for malignant neoplasm of colon: Secondary | ICD-10-CM

## 2022-02-23 DIAGNOSIS — Z853 Personal history of malignant neoplasm of breast: Secondary | ICD-10-CM

## 2022-02-23 DIAGNOSIS — Z114 Encounter for screening for human immunodeficiency virus [HIV]: Secondary | ICD-10-CM

## 2022-02-23 DIAGNOSIS — Z124 Encounter for screening for malignant neoplasm of cervix: Secondary | ICD-10-CM | POA: Diagnosis not present

## 2022-02-23 DIAGNOSIS — E559 Vitamin D deficiency, unspecified: Secondary | ICD-10-CM

## 2022-02-23 DIAGNOSIS — R5383 Other fatigue: Secondary | ICD-10-CM

## 2022-02-23 DIAGNOSIS — Z0000 Encounter for general adult medical examination without abnormal findings: Secondary | ICD-10-CM | POA: Diagnosis not present

## 2022-02-23 DIAGNOSIS — R7309 Other abnormal glucose: Secondary | ICD-10-CM | POA: Diagnosis not present

## 2022-02-23 NOTE — Progress Notes (Signed)
?Industrial/product designer as a Education administrator for [employer], FNP.,have documented all relevant documentation on the behalf of [name], FNP,as directed by  [name], FNP while in the presence of [name], [place]. ? ?This visit occurred during the SARS-CoV-2 public health emergency.  Safety protocols were in place, including screening questions prior to the visit, additional usage of staff PPE, and extensive cleaning of exam room while observing appropriate contact time as indicated for disinfecting solutions. ? ?Subjective:  ?  ? Patient ID: Sally Nguyen , female    DOB: 1956-06-08 , 66 y.o.   MRN: [id number] ? ? ?Chief Complaint  ?Patient presents with  ? Annual Exam  ? ? ?HPI ? ?Here for HM. She continues to be seen by Oncology and Neurology, no changes. Continues with her breast cancer support group. No reported seizures, no changes with medications.  ?  ? ?Past Medical History:  ?Diagnosis Date  ? Cancer ([place]) 05/2019  ? left breast DCIS  ? Seizures ([place])   ? Vision abnormalities   ?  ? ?Family History  ?Problem Relation Age of Onset  ? Liver cancer Mother   ? Colon cancer Mother   ?     33  ? Atrial fibrillation Father   ? Obesity Sister   ? ? ? ?Current Outpatient Medications:  ?  Ascorbic Acid (VITAMIN C) 100 MG tablet, Take 100 mg by mouth daily., Disp: , Rfl:  ?  Calcium Citrate (CITRACAL PO), Take by mouth. +vit d, Disp: , Rfl:  ?  cetirizine (ZYRTEC) 10 MG tablet, Take 10 mg by mouth daily., Disp: , Rfl:  ?  cholecalciferol (VITAMIN D) 1000 units tablet, Take 2,000 Units by mouth daily., Disp: , Rfl:  ?  gabapentin (NEURONTIN) 100 MG capsule, TAKE 1 OR 2 CAPSULES AT BEDTIME, Disp: 180 capsule, Rfl: 4 ?  Oxcarbazepine (TRILEPTAL) 300 MG tablet, Take 1 and 1/2 tablet in the morning and 2 tablets in the evening., Disp: 360 tablet, Rfl: 4 ?  UNABLE TO FIND, 500 mg. Med Name: Tumeric Curcumin, Disp: , Rfl:  ?  zinc gluconate 50 MG tablet, Take 50 mg by mouth daily., Disp: , Rfl:   ? ?Allergies   ?Allergen Reactions  ? Penicillins Rash  ?  ? ? ?The patient states she is post menopausal status. No LMP recorded. Patient is postmenopausal.. Negative for Dysmenorrhea and Negative for Menorrhagia. Negative for: breast discharge, breast lump(s), breast pain and breast self exam. Associated symptoms include abnormal vaginal bleeding. Pertinent negatives include abnormal bleeding (hematology), anxiety, decreased libido, depression, difficulty falling sleep, dyspareunia, history of infertility, nocturia, sexual dysfunction, sleep disturbances, urinary incontinence, urinary urgency, vaginal discharge and vaginal itching. Diet regular.The patient states her exercise level is   ? Marland Kitchen The patient's tobacco use is:  ?Social History  ? ?Tobacco Use  ?Smoking Status Never  ?Smokeless Tobacco Never  ?She has been exposed to passive smoke. The patient's alcohol use is:  ?Social History  ? ?Substance and Sexual Activity  ?Alcohol Use No  ?. Additional information: Last pap 02/17/2019, next one scheduled for today.   ? ?Review of Systems  ?Constitutional: Negative.   ?HENT: Negative.    ?Eyes: Negative.   ?Respiratory: Negative.    ?Cardiovascular: Negative.   ?Gastrointestinal: Negative.   ?Endocrine: Negative.   ?Genitourinary: Negative.   ?Musculoskeletal: Negative.   ?Skin: Negative.   ?Allergic/Immunologic: Negative.   ?Neurological: Negative.   ?Hematological: Negative.   ?Psychiatric/Behavioral: Negative.     ? ?Today's Vitals  ?  02/23/22 1036  ?BP: 122/60  ?Pulse: 98  ?Temp: 98.2 ?F (36.8 ?C)  ?TempSrc: Oral  ?Weight: 128 lb 6.4 oz (58.2 kg)  ?Height: $RemoveB'5\' 6"'pEAFfknh$  (1.676 m)  ? ?Body mass index is 20.72 kg/m?.  ?Wt Readings from Last 3 Encounters:  ?02/23/22 128 lb 6.4 oz (58.2 kg)  ?07/14/21 123 lb 1.6 oz (55.8 kg)  ?05/03/21 118 lb (53.5 kg)  ? ? ?Objective:  ?Physical Exam ?Exam conducted with a chaperone present.  ?Constitutional:   ?   General: She is not in acute distress. ?   Appearance: Normal appearance. She is  well-developed and normal weight.  ?HENT:  ?   Head: Normocephalic and atraumatic.  ?   Right Ear: Hearing, tympanic membrane, ear canal and external ear normal. There is no impacted cerumen.  ?   Left Ear: Hearing, tympanic membrane, ear canal and external ear normal. There is no impacted cerumen.  ?   Nose:  ?   Comments: Deferred - masked ?   Mouth/Throat:  ?   Comments: Deferred - masked ?Eyes:  ?   General: Lids are normal.  ?   Extraocular Movements: Extraocular movements intact.  ?   Conjunctiva/sclera: Conjunctivae normal.  ?   Pupils: Pupils are equal, round, and reactive to light.  ?   Funduscopic exam: ?   Right eye: No papilledema.     ?   Left eye: No papilledema.  ?Neck:  ?   Thyroid: No thyroid mass.  ?   Vascular: No carotid bruit.  ?Cardiovascular:  ?   Rate and Rhythm: Normal rate and regular rhythm.  ?   Pulses: Normal pulses.  ?   Heart sounds: Normal heart sounds. No murmur heard. ?Pulmonary:  ?   Effort: Pulmonary effort is normal.  ?   Breath sounds: Normal breath sounds.  ?Chest:  ?   Chest wall: No mass.  ?Breasts: ?   Tanner Score is 5.  ?   Right: Normal. No mass or tenderness.  ?   Left: Absent. No mass or tenderness.  ?   Comments: Surgical scar to left breast ?Abdominal:  ?   General: Abdomen is flat. Bowel sounds are normal. There is no distension.  ?   Palpations: Abdomen is soft.  ?   Tenderness: There is no abdominal tenderness.  ?   Hernia: There is no hernia in the left inguinal area or right inguinal area.  ?Genitourinary: ?   General: Normal vulva.  ?   Exam position: Lithotomy position.  ?   Tanner stage (genital): 5.  ?   Labia:     ?   Right: No rash or tenderness.     ?   Left: No rash or tenderness.   ?   Urethra: No prolapse or urethral swelling.  ?   Vagina: Normal.  ?   Cervix: Normal.  ?   Uterus: Normal.   ?   Adnexa: Right adnexa normal and left adnexa normal.    ?   Right: No mass or tenderness.      ?   Left: No mass or tenderness.    ?   Rectum: Normal. Guaiac  stool: negative.  ?Musculoskeletal:     ?   General: No swelling. Normal range of motion.  ?   Cervical back: Full passive range of motion without pain, normal range of motion and neck supple.  ?   Right lower leg: No edema.  ?   Left lower leg:  No edema.  ?Lymphadenopathy:  ?   Upper Body:  ?   Right upper body: No supraclavicular, axillary or pectoral adenopathy.  ?   Left upper body: No supraclavicular, axillary or pectoral adenopathy.  ?Skin: ?   General: Skin is warm and dry.  ?   Capillary Refill: Capillary refill takes less than 2 seconds.  ?Neurological:  ?   General: No focal deficit present.  ?   Mental Status: She is alert and oriented to person, place, and time.  ?   Cranial Nerves: No cranial nerve deficit.  ?   Sensory: No sensory deficit.  ?Psychiatric:     ?   Mood and Affect: Mood normal.     ?   Behavior: Behavior normal.     ?   Thought Content: Thought content normal.     ?   Judgment: Judgment normal.  ?  ? ?   ?Assessment And Plan:  ?   ?1. Encounter for general adult medical examination w/o abnormal findings ?Behavior modifications discussed and diet history reviewed.   ?Pt will continue to exercise regularly and modify diet with low GI, plant based foods and decrease intake of processed foods.  ?Recommend intake of daily multivitamin, Vitamin D, and calcium.  ?Recommend mammogram and colonoscopy for preventive screenings, as well as recommend immunizations that include influenza (declines), TDAP, and Shingles (declines) ? ?2. Vitamin D deficiency ?Will check vitamin D level and supplement as needed.    ?Also encouraged to spend 15 minutes in the sun daily.  ?- VITAMIN D 25 Hydroxy (Vit-D Deficiency, Fractures) ? ?3. Abnormal glucose ?Comments: Stable, no current medications. Continue with a healthy diet low in sugar and carbohydrates.  ?- Hemoglobin A1c ? ?4. Elevated cholesterol ?Comments: She had a slight elevation of her LDLs last year, will recheck. Encouraged to continue with a low fat  diet. ?- CMP14+EGFR ?- Lipid panel ? ?5. Other fatigue ?Comments: Will check for metabolic cause, she has recently lost her husband so some of this may be a component of grief.  ?- CBC ?- TSH ?- Vitamin B12 ? ?6. Screening

## 2022-02-24 LAB — CBC
Hematocrit: 43.8 % (ref 34.0–46.6)
Hemoglobin: 14.3 g/dL (ref 11.1–15.9)
MCH: 28.5 pg (ref 26.6–33.0)
MCHC: 32.6 g/dL (ref 31.5–35.7)
MCV: 87 fL (ref 79–97)
Platelets: 303 10*3/uL (ref 150–450)
RBC: 5.01 x10E6/uL (ref 3.77–5.28)
RDW: 13.1 % (ref 11.7–15.4)
WBC: 6.1 10*3/uL (ref 3.4–10.8)

## 2022-02-24 LAB — VITAMIN B12: Vitamin B-12: 477 pg/mL (ref 232–1245)

## 2022-02-24 LAB — CMP14+EGFR
ALT: 14 IU/L (ref 0–32)
AST: 15 IU/L (ref 0–40)
Albumin/Globulin Ratio: 1.5 (ref 1.2–2.2)
Albumin: 4.8 g/dL (ref 3.8–4.8)
Alkaline Phosphatase: 75 IU/L (ref 44–121)
BUN/Creatinine Ratio: 17 (ref 12–28)
BUN: 16 mg/dL (ref 8–27)
Bilirubin Total: 0.3 mg/dL (ref 0.0–1.2)
CO2: 26 mmol/L (ref 20–29)
Calcium: 10.5 mg/dL — ABNORMAL HIGH (ref 8.7–10.3)
Chloride: 101 mmol/L (ref 96–106)
Creatinine, Ser: 0.92 mg/dL (ref 0.57–1.00)
Globulin, Total: 3.2 g/dL (ref 1.5–4.5)
Glucose: 91 mg/dL (ref 70–99)
Potassium: 4.6 mmol/L (ref 3.5–5.2)
Sodium: 145 mmol/L — ABNORMAL HIGH (ref 134–144)
Total Protein: 8 g/dL (ref 6.0–8.5)
eGFR: 70 mL/min/{1.73_m2} (ref >59)

## 2022-02-24 LAB — LIPID PANEL
Chol/HDL Ratio: 2.5 ratio (ref 0.0–4.4)
Cholesterol, Total: 192 mg/dL (ref 100–199)
HDL: 77 mg/dL (ref >39)
LDL Chol Calc (NIH): 104 mg/dL — ABNORMAL HIGH (ref 0–99)
Triglycerides: 59 mg/dL (ref 0–149)
VLDL Cholesterol Cal: 11 mg/dL (ref 5–40)

## 2022-02-24 LAB — TSH: TSH: 2.39 u[IU]/mL (ref 0.450–4.500)

## 2022-02-24 LAB — HEMOGLOBIN A1C
Est. average glucose Bld gHb Est-mCnc: 120 mg/dL
Hgb A1c MFr Bld: 5.8 % — ABNORMAL HIGH (ref 4.8–5.6)

## 2022-02-24 LAB — HIV ANTIBODY (ROUTINE TESTING W REFLEX): HIV Screen 4th Generation wRfx: NONREACTIVE

## 2022-02-24 LAB — VITAMIN D 25 HYDROXY (VIT D DEFICIENCY, FRACTURES): Vit D, 25-Hydroxy: 67.1 ng/mL (ref 30.0–100.0)

## 2022-02-27 LAB — CYTOLOGY - PAP
Adequacy: ABSENT
Diagnosis: NEGATIVE

## 2022-04-18 ENCOUNTER — Other Ambulatory Visit: Payer: Self-pay | Admitting: Nurse Practitioner

## 2022-04-18 DIAGNOSIS — Z1231 Encounter for screening mammogram for malignant neoplasm of breast: Secondary | ICD-10-CM

## 2022-05-03 LAB — HM COLONOSCOPY

## 2022-05-09 NOTE — Progress Notes (Unsigned)
PATIENT: Sally Nguyen DOB: 15-Apr-1956  REASON FOR VISIT: follow up HISTORY FROM: patient  No chief complaint on file.    HISTORY OF PRESENT ILLNESS:  05/09/22 ALL:    05/03/2021 ALL: Sally Nguyen returns for follow up for seizures. She continues oxcarbazepine '450mg'$  in am and '600mg'$  in pm. She also continues gabapentin 100-'200mg'$  at bedtime for delayed sleep phase. She feels that she is doing well. She lost her husband in April and feels stress levels have been higher but she is doing ok. No seizure activity. She has not slept as well but feels she is getting back into a normal schedule. She is followed regularly by PCP. She continues vitamin D and calcium supplements.    04/27/2020 ALL:  Sally Nguyen is a 66 y.o. female here today for follow up for seizures and delayed sleep phase. She continues oxcarbamazepine '450mg'$  in am and '600mg'$  in pm. Last seizure in 2004. She continues gabapentin 100-'200mg'$  at bedtime which helps with sleep. She was diagnosed with left sided breast cancer. She had a left mastectomy on 06/19/2019 but did not require any additional treatment. She is followed regularly by oncology. Labs with PCP normal in 02/2020.   HISTORY: (copied from Dr Garth Bigness note on 05/14/2019)  She denies any more seizures.  Her last seizure was in 2004.  Prior to that, she was having about 1-2 seizures per year.  She tolerates the oxcarbazepine well. Current dose is 450 mg qAM and 600 mg qHS. In 2018, the blood level was low normal.  Her last EEG was in 2017 and showed hypersynchronous activity in the right temporal region that could be consistent with a seizure disorder.  An EEG in 2016 showed a left temporal focus.  She reports that most EEGs have shown some abnormality.   Her second issue is a mild sleep disturbance.  She has delayed phase sleep syndrome usually going to bed around 2 AM hours of sleep.  When she falls asleep she does well.  Adding the gabapentin before bedtime has helped her  fall asleep and stay asleep better.Marland Kitchen   REVIEW OF SYSTEMS: Out of a complete 14 system review of symptoms, the patient complains only of the following symptoms, insomnia and all other reviewed systems are negative.   ALLERGIES: Allergies  Allergen Reactions   Penicillins Rash    HOME MEDICATIONS: Outpatient Medications Prior to Visit  Medication Sig Dispense Refill   Ascorbic Acid (VITAMIN C) 100 MG tablet Take 100 mg by mouth daily.     Calcium Citrate (CITRACAL PO) Take by mouth. +vit d     cetirizine (ZYRTEC) 10 MG tablet Take 10 mg by mouth daily.     cholecalciferol (VITAMIN D) 1000 units tablet Take 2,000 Units by mouth daily.     gabapentin (NEURONTIN) 100 MG capsule TAKE 1 OR 2 CAPSULES AT BEDTIME 180 capsule 4   Oxcarbazepine (TRILEPTAL) 300 MG tablet Take 1 and 1/2 tablet in the morning and 2 tablets in the evening. 360 tablet 4   UNABLE TO FIND 500 mg. Med Name: Tumeric Curcumin     zinc gluconate 50 MG tablet Take 50 mg by mouth daily.     No facility-administered medications prior to visit.    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   Seizures ([place])    Vision abnormalities     PAST SURGICAL HISTORY: Past Surgical History:  Procedure Laterality Date  COLONOSCOPY     MASTECTOMY Left    MASTECTOMY W/ SENTINEL NODE BIOPSY Left 06/19/2019   Procedure: LEFT MASTECTOMY WITH SENTINEL LYMPH NODE BIOPSY;  Surgeon: Stark Klein, MD;  Location: [facility];  Service: General;  Laterality: Left;   WISDOM TOOTH EXTRACTION      FAMILY HISTORY: Family History  Problem Relation Age of Onset   Liver cancer Mother    Colon cancer Mother        71   Atrial fibrillation Father    Obesity Sister     SOCIAL HISTORY: Social History   Socioeconomic History   Marital status: Widowed    Spouse name: Not on file   Number of children: 1   Years of education: Not on file   Highest education  level: Not on file  Occupational History   Not on file  Tobacco Use   Smoking status: Never   Smokeless tobacco: Never  Substance and Sexual Activity   Alcohol use: No   Drug use: No   Sexual activity: Not on file  Other Topics Concern   Not on file  Social History Narrative   Not on file   Social Determinants of Health   Financial Resource Strain: Not on file  Food Insecurity: Not on file  Transportation Needs: Not on file  Physical Activity: Not on file  Stress: Not on file  Social Connections: Not on file  Intimate Partner Violence: Not on file      PHYSICAL EXAM  There were no vitals filed for this visit.  There is no height or weight on file to calculate BMI.  Generalized: Well developed, in no acute distress  Cardiology: normal rate and rhythm, no murmur noted Respiratory: clear to auscultation bilaterally  Neurological examination  Mentation: Alert oriented to time, place, history taking. Follows all commands speech and language fluent Cranial nerve II-XII: Pupils were equal round reactive to light. Extraocular movements were full, visual field were full on confrontational test. Facial sensation and strength were normal. Head turning and shoulder shrug  were normal and symmetric. Motor: The motor testing reveals 5 over 5 strength of all 4 extremities. Good symmetric motor tone is noted throughout.  Sensory: Sensory testing is intact to soft touch on all 4 extremities. No evidence of extinction is noted.  Coordination: Cerebellar testing reveals good finger-nose-finger and heel-to-shin bilaterally.  Gait and station: Gait is normal.   DIAGNOSTIC DATA (LABS, IMAGING, TESTING) - I reviewed patient records, labs, notes, testing and imaging myself where available.      View : No data to display.           Lab Results  Component Value Date   WBC 6.1 02/23/2022   HGB 14.3 02/23/2022   HCT 43.8 02/23/2022   MCV 87 02/23/2022   PLT 303 02/23/2022       Component Value Date/Time   NA 145 (H) 02/23/2022 1148   K 4.6 02/23/2022 1148   CL 101 02/23/2022 1148   CO2 26 02/23/2022 1148   GLUCOSE 91 02/23/2022 1148   GLUCOSE 68 (L) 07/14/2021 1322   BUN 16 02/23/2022 1148   CREATININE 0.92 02/23/2022 1148   CREATININE 0.83 07/12/2020 1049   CALCIUM 10.5 (H) 02/23/2022 1148   PROT 8.0 02/23/2022 1148   ALBUMIN 4.8 02/23/2022 1148   AST 15 02/23/2022 1148   AST 11 (L) 07/12/2020 1049   ALT 14 02/23/2022 1148   ALT 8 07/12/2020 1049   ALKPHOS 75 02/23/2022  1148   BILITOT 0.3 02/23/2022 1148   BILITOT 0.3 07/12/2020 1049   GFRNONAA >60 07/14/2021 1322   GFRNONAA >60 07/12/2020 1049   GFRAA >60 07/12/2020 1049   Lab Results  Component Value Date   CHOL 192 02/23/2022   HDL 77 02/23/2022   LDLCALC 104 (H) 02/23/2022   TRIG 59 02/23/2022   CHOLHDL 2.5 02/23/2022   Lab Results  Component Value Date   HGBA1C 5.8 (H) 02/23/2022   Lab Results  Component Value Date   MQKMMNOT77 116 02/23/2022   Lab Results  Component Value Date   TSH 2.390 02/23/2022       ASSESSMENT AND PLAN 66 y.o. year old female  has a past medical history of Cancer ([place]) (05/2019), Seizures ([place]), and Vision abnormalities. here with   No diagnosis found.   Keith is doing very well today.  She continues oxcarbamazepine 450 mg in the morning and 600 mg in the evenings.  No recent seizure activity.  Gabapentin 1 to 200 mg in the evenings helps with delayed sleep phase.  She is tolerating medications well with no obvious adverse effects.  We will continue current therapy.  Lab work has been reviewed in epic. I will update oxcarbazepine levels, today. She was encouraged to stay well-hydrated, focus on a well-balanced diet and regular exercise.  She will continue close follow-up with primary care and oncology. She will follow-up with me in 1 year, sooner if needed.  She verbalizes understanding and agreement with this plan.   No orders of the defined types were  placed in this encounter.    No orders of the defined types were placed in this encounter.      Debbora Presto, FNP-C 05/09/2022, 1:00 PM [facility] [address], [place], [place] [address] [phone]

## 2022-05-10 ENCOUNTER — Ambulatory Visit: Admitting: Family Medicine

## 2022-05-10 ENCOUNTER — Encounter: Payer: Self-pay | Admitting: Family Medicine

## 2022-05-10 VITALS — BP 112/77 | HR 85 | Ht 66.0 in | Wt 130.6 lb

## 2022-05-10 DIAGNOSIS — G4721 Circadian rhythm sleep disorder, delayed sleep phase type: Secondary | ICD-10-CM | POA: Diagnosis not present

## 2022-05-10 DIAGNOSIS — G40209 Localization-related (focal) (partial) symptomatic epilepsy and epileptic syndromes with complex partial seizures, not intractable, without status epilepticus: Secondary | ICD-10-CM | POA: Diagnosis not present

## 2022-05-10 MED ORDER — OXCARBAZEPINE 300 MG PO TABS: ORAL_TABLET | ORAL | 4 refills | Status: DC

## 2022-05-10 MED ORDER — GABAPENTIN 100 MG PO CAPS: ORAL_CAPSULE | ORAL | 4 refills | Status: DC

## 2022-05-10 NOTE — Patient Instructions (Signed)
Below is our plan: ? ?We will continue current treatment plan.  ? ?Please make sure you are consistent with timing of seizure medication. I recommend annual visit with primary care provider (PCP) for complete physical and routine blood work. We will monitor vitamin D level. I recommend daily intake of vitamin D (400-800iu) and calcium (800-1000mg) for bone health. Discuss Dexa screening with PCP.  ? ?According to [place] law, you can not drive unless you are seizure / syncope free for at least 6 months and under physician's care. ? ?Please maintain precautions. Do not participate in activities where a loss of awareness could harm you or someone else. No swimming alone, no tub bathing, no hot tubs, no driving, no operating motorized vehicles (cars, ATVs, motocycles, etc), lawnmowers, power tools or firearms. No standing at heights, such as rooftops, ladders or stairs. Avoid hot objects such as stoves, heaters, open fires. Wear a helmet when riding a bicycle, scooter, skateboard, etc. and avoid areas of traffic. Set your water heater to 120 degrees or less.  ? ? ?Please make sure you are staying well hydrated. I recommend 50-60 ounces daily. Well balanced diet and regular exercise encouraged. Consistent sleep schedule with 6-8 hours recommended.  ? ?Please continue follow up with care team as directed.  ? ?Follow up with me in 1 year  ? ?You may receive a survey regarding today's visit. I encourage you to leave honest feed back as I do use this information to improve patient care. Thank you for seeing me today!  ? ? ?

## 2022-05-11 LAB — 10-HYDROXYCARBAZEPINE: Oxcarbazepine SerPl-Mcnc: 17 ug/mL (ref 10–35)

## 2022-05-12 ENCOUNTER — Encounter: Payer: Self-pay | Admitting: Nurse Practitioner

## 2022-05-16 ENCOUNTER — Encounter: Payer: Self-pay | Admitting: Neurology

## 2022-05-18 ENCOUNTER — Ambulatory Visit
Admission: RE | Admit: 2022-05-18 | Discharge: 2022-05-18 | Disposition: A | Discharge status: Home / Self Care | Source: Ambulatory Visit | Attending: Nurse Practitioner | Admitting: Nurse Practitioner

## 2022-05-18 DIAGNOSIS — Z1231 Encounter for screening mammogram for malignant neoplasm of breast: Secondary | ICD-10-CM

## 2022-07-10 ENCOUNTER — Other Ambulatory Visit: Payer: Self-pay | Admitting: Nurse Practitioner

## 2022-07-10 DIAGNOSIS — D0512 Intraductal carcinoma in situ of left breast: Secondary | ICD-10-CM

## 2022-07-10 NOTE — Progress Notes (Deleted)
[facility]   [phone] [phone]   Clinic Follow up Note   Patient Care Team: [name], [place] as PCP - General (General Practice) Mauro Kaufmann, RN as Oncology Nurse Navigator Rockwell Germany, RN as Oncology Nurse Navigator Stark Klein, MD as Consulting Physician (General Surgery) Truitt Merle, MD as Consulting Physician (Hematology) Eppie Gibson, MD as Attending Physician (Radiation Oncology) Alla Feeling, NP as Nurse Practitioner (Nurse Practitioner) 07/10/2022  CHIEF COMPLAINT: Follow-up left breast DCIS  SUMMARY OF ONCOLOGIC HISTORY: Oncology History  Ductal carcinoma in situ (DCIS) of left breast  05/08/2019 Mammogram   Mammogram 05/08/19  IMPRESSION: Suspicious microcalcifications over the outer midportion of the left breast spanning 2.4 x 3.3 x 3.9 cm.   05/14/2019 Initial Biopsy   Diagnosis 05/14/19  Breast, left, needle core biopsy, outer mid - DUCTAL CARCINOMA IN SITU WITH CALCIFICATIONS. - SEE COMMENT. Estrogen Receptor: 0%, NEGATIVE Progesterone Receptor: 0%, NEGATIVE   05/14/2019 Cancer Staging   Staging form: Breast, AJCC 8th Edition - Clinical stage from 05/14/2019: Stage Unknown (cTis (DCIS), cNX, cM0, ER-, PR-, HER2: Not Assessed) - Signed by Truitt Merle, MD on 05/20/2019 Cancer Staging Ductal carcinoma in situ (DCIS) of left breast Staging form: Breast, AJCC 8th Edition - Clinical stage from 05/14/2019: Stage Unknown (cTis (DCIS), cNX, cM0, ER-, PR-, HER2: Not Assessed) - Signed by Truitt Merle, MD on 05/20/2019 - Pathologic stage from 06/09/2019: Stage 0 (pTis (DCIS), pN0, cM0, G3, ER-, PR-, HER2: Not Assessed) - Signed by Alla Feeling, NP on 10/15/2019    05/15/2019 Initial Diagnosis   Ductal carcinoma in situ (DCIS) of left breast   05/27/2019 Breast MRI   Breast MRI 05/27/19  IMPRESSION: 1. 3.9 centimeter area of non mass enhancement in the LOWER OUTER QUADRANT of the LEFT breast, correlating well with the extent  of calcifications seen mammographically. The clip is centrally located within this group of calcifications/non mass enhancement. 2. Small possible satellite nodule anterior to the non mass enhancement measuring 0.6 centimeters. Consider MR guided core biopsy of this lesion to define the anterior extent. If biopsy of this lesion is benign and concordant, I would recommend localization of the entire area of calcifications, to include the anterior and posterior components.   06/09/2019 Pathology Results   Diagnosis 06/09/19 Breast, left, needle core biopsy, LOQ - DUCTAL CARCINOMA IN SITU WITH CALCIFICATIONS AND NECROSIS, SEE COMMENT. Results: IMMUNOHISTOCHEMICAL AND MORPHOMETRIC ANALYSIS PERFORMED MANUALLY Estrogen Receptor: 0%, NEGATIVE Progesterone Receptor: 0%, NEGATIVE   06/09/2019 Cancer Staging   Staging form: Breast, AJCC 8th Edition - Pathologic stage from 06/09/2019: Stage 0 (pTis (DCIS), pN0, cM0, G3, ER-, PR-, HER2: Not Assessed) - Signed by Alla Feeling, NP on 10/15/2019   06/19/2019 Surgery   LEFT MASTECTOMY WITH SENTINEL LYMPH NODE BIOPSY by Dr. Barry Dienes  06/19/19    06/19/2019 Pathology Results   Diagnosis 06/19/19 1. Breast, simple mastectomy, Left - DUCTAL CARCINOMA IN SITU, HIGH GRADE, WITH CALCIFICATIONS AND NECROSIS - MARGINS UNINVOLVED BY CARCINOMA (2.5 CM; POSTERIOR) - PREVIOUS BIOPSY SITE CHANGES - SEE ONCOLOGY TABLE AND COMMENT BELOW 2. Lymph node, sentinel, biopsy, Left #1 - NO CARCINOMA IDENTIFIED IN ONE LYMPH NODE (0/1) 3. Lymph node, sentinel, biopsy, Left #2 - NO CARCINOMA IDENTIFIED IN ONE LYMPH NODE (0/1)   10/15/2019 Survivorship   Per [name], NP      CURRENT THERAPY: Surveillance  INTERVAL HISTORY: Ms. Karpel returns for follow-up as scheduled, last seen by Dr. Burr Medico 07/14/2021.  Right mammogram 05/18/2022  was negative.   REVIEW OF SYSTEMS:   Constitutional: Denies fevers, chills or abnormal weight loss Eyes: Denies blurriness of  vision Ears, nose, mouth, throat, and face: Denies mucositis or sore throat Respiratory: Denies cough, dyspnea or wheezes Cardiovascular: Denies palpitation, chest discomfort or lower extremity swelling Gastrointestinal:  Denies nausea, heartburn or change in bowel habits Skin: Denies abnormal skin rashes Lymphatics: Denies new lymphadenopathy or easy bruising Neurological:Denies numbness, tingling or new weaknesses Behavioral/Psych: Mood is stable, no new changes  All other systems were reviewed with the patient and are negative.  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  ALLERGIES:  is allergic to penicillins.  MEDICATIONS:  Current Outpatient Medications  Medication Sig Dispense Refill   Ascorbic Acid (VITAMIN C) 100 MG tablet Take 100 mg by mouth daily.     Calcium Citrate (CITRACAL PO) Take by mouth. +vit d     cetirizine (ZYRTEC) 10 MG tablet Take 10 mg by mouth daily.     cholecalciferol (VITAMIN D) 1000 units tablet Take 2,000 Units by mouth daily.     gabapentin (NEURONTIN) 100 MG capsule TAKE 1 OR 2 CAPSULES AT BEDTIME 180 capsule 4   Oxcarbazepine (TRILEPTAL) 300 MG tablet Take 1 and 1/2 tablet in the morning and 2 tablets in the evening. 360 tablet 4   UNABLE TO FIND 500 mg. Med Name: Tumeric Curcumin     zinc gluconate 50 MG tablet Take 50 mg by mouth daily.     No current facility-administered  medications for this visit.    PHYSICAL EXAMINATION: ECOG PERFORMANCE STATUS: {CHL ONC ECOG [phone]}  There were no vitals filed for this visit. There were no vitals filed for this visit.  GENERAL:alert, no distress and comfortable SKIN: skin color, texture, turgor are normal, no rashes or significant lesions EYES: normal, Conjunctiva are pink and non-injected, sclera clear OROPHARYNX:no exudate, no erythema and lips, buccal mucosa, and tongue normal  NECK: supple, thyroid normal size, non-tender, without nodularity LYMPH:  no palpable lymphadenopathy in the cervical, axillary or inguinal LUNGS: clear to auscultation and percussion with normal breathing effort HEART: regular rate & rhythm and no murmurs and no lower extremity edema ABDOMEN:abdomen soft, non-tender and normal bowel sounds Musculoskeletal:no cyanosis of digits and no clubbing  NEURO: alert & oriented x 3 with fluent speech, no focal motor/sensory deficits  LABORATORY DATA:  I have reviewed the data as listed    Latest Ref Rng & Units 02/23/2022   11:48 AM 07/14/2021    1:22 PM 02/22/2021   10:48 AM  CBC  WBC 3.4 - 10.8 x10E3/uL 6.1  4.6  4.8   Hemoglobin 11.1 - 15.9 g/dL 14.3  13.0  13.5   Hematocrit 34.0 - 46.6 % 43.8  39.8  41.8   Platelets 150 - 450 x10E3/uL 303  257  283  Latest Ref Rng & Units 02/23/2022   11:48 AM 07/14/2021    1:22 PM 02/22/2021   10:48 AM  CMP  Glucose 70 - 99 mg/dL 91  68  77   BUN 8 - 27 mg/dL $Remove'16  17  16   'hgRRsgA$ Creatinine 0.57 - 1.00 mg/dL 0.92  0.87  0.93   Sodium 134 - 144 mmol/L 145  142  142   Potassium 3.5 - 5.2 mmol/L 4.6  4.0  4.4   Chloride 96 - 106 mmol/L 101  107  102   CO2 20 - 29 mmol/L $RemoveB'26  27  24   'GNcGkKhZ$ Calcium 8.7 - 10.3 mg/dL 10.5  9.4  9.9   Total Protein 6.0 - 8.5 g/dL 8.0  7.4  7.3   Total Bilirubin 0.0 - 1.2 mg/dL 0.3  0.4  0.3   Alkaline Phos 44 - 121 IU/L 75  69  73   AST 0 - 40 IU/L $Remov'15  14  10   'pmsndF$ ALT 0 - 32 IU/L $Remov'14  11  9       'ICZMKR$ RADIOGRAPHIC  STUDIES: I have personally reviewed the radiological images as listed and agreed with the findings in the report. No results found.   ASSESSMENT & PLAN:  No problem-specific Assessment & Plan notes found for this encounter.   No orders of the defined types were placed in this encounter.  All questions were answered. The patient knows to call the clinic with any problems, questions or concerns. No barriers to learning was detected. I spent {CHL ONC TIME VISIT - YIFOY:[id number]} counseling the patient face to face. The total time spent in the appointment was {CHL ONC TIME VISIT - EHMCN:[id number]} and more than 50% was on counseling and review of test results     Alla Feeling, NP 07/10/22

## 2022-07-12 ENCOUNTER — Ambulatory Visit: Payer: Non-veteran care | Admitting: Nurse Practitioner

## 2022-07-25 NOTE — Progress Notes (Unsigned)
[facility]   [phone] [phone]   Clinic Follow up Note   Patient Care Team: [name], [place] as PCP - General (General Practice) Mauro Kaufmann, RN as Oncology Nurse Navigator Rockwell Germany, RN as Oncology Nurse Navigator Stark Klein, MD as Consulting Physician (General Surgery) Truitt Merle, MD as Consulting Physician (Hematology) Eppie Gibson, MD as Attending Physician (Radiation Oncology) Alla Feeling, NP as Nurse Practitioner (Nurse Practitioner) 07/26/2022  CHIEF COMPLAINT: Follow-up left breast DCIS  SUMMARY OF ONCOLOGIC HISTORY: Oncology History  Ductal carcinoma in situ (DCIS) of left breast  05/08/2019 Mammogram   Mammogram 05/08/19  IMPRESSION: Suspicious microcalcifications over the outer midportion of the left breast spanning 2.4 x 3.3 x 3.9 cm.   05/14/2019 Initial Biopsy   Diagnosis 05/14/19  Breast, left, needle core biopsy, outer mid - DUCTAL CARCINOMA IN SITU WITH CALCIFICATIONS. - SEE COMMENT. Estrogen Receptor: 0%, NEGATIVE Progesterone Receptor: 0%, NEGATIVE   05/14/2019 Cancer Staging   Staging form: Breast, AJCC 8th Edition - Clinical stage from 05/14/2019: Stage Unknown (cTis (DCIS), cNX, cM0, ER-, PR-, HER2: Not Assessed) - Signed by Truitt Merle, MD on 05/20/2019 Cancer Staging Ductal carcinoma in situ (DCIS) of left breast Staging form: Breast, AJCC 8th Edition - Clinical stage from 05/14/2019: Stage Unknown (cTis (DCIS), cNX, cM0, ER-, PR-, HER2: Not Assessed) - Signed by Truitt Merle, MD on 05/20/2019 - Pathologic stage from 06/09/2019: Stage 0 (pTis (DCIS), pN0, cM0, G3, ER-, PR-, HER2: Not Assessed) - Signed by Alla Feeling, NP on 10/15/2019    05/15/2019 Initial Diagnosis   Ductal carcinoma in situ (DCIS) of left breast   05/27/2019 Breast MRI   Breast MRI 05/27/19  IMPRESSION: 1. 3.9 centimeter area of non mass enhancement in the LOWER OUTER QUADRANT of the LEFT breast, correlating well with the extent  of calcifications seen mammographically. The clip is centrally located within this group of calcifications/non mass enhancement. 2. Small possible satellite nodule anterior to the non mass enhancement measuring 0.6 centimeters. Consider MR guided core biopsy of this lesion to define the anterior extent. If biopsy of this lesion is benign and concordant, I would recommend localization of the entire area of calcifications, to include the anterior and posterior components.   06/09/2019 Pathology Results   Diagnosis 06/09/19 Breast, left, needle core biopsy, LOQ - DUCTAL CARCINOMA IN SITU WITH CALCIFICATIONS AND NECROSIS, SEE COMMENT. Results: IMMUNOHISTOCHEMICAL AND MORPHOMETRIC ANALYSIS PERFORMED MANUALLY Estrogen Receptor: 0%, NEGATIVE Progesterone Receptor: 0%, NEGATIVE   06/09/2019 Cancer Staging   Staging form: Breast, AJCC 8th Edition - Pathologic stage from 06/09/2019: Stage 0 (pTis (DCIS), pN0, cM0, G3, ER-, PR-, HER2: Not Assessed) - Signed by Alla Feeling, NP on 10/15/2019   06/19/2019 Surgery   LEFT MASTECTOMY WITH SENTINEL LYMPH NODE BIOPSY by Dr. Barry Dienes  06/19/19    06/19/2019 Pathology Results   Diagnosis 06/19/19 1. Breast, simple mastectomy, Left - DUCTAL CARCINOMA IN SITU, HIGH GRADE, WITH CALCIFICATIONS AND NECROSIS - MARGINS UNINVOLVED BY CARCINOMA (2.5 CM; POSTERIOR) - PREVIOUS BIOPSY SITE CHANGES - SEE ONCOLOGY TABLE AND COMMENT BELOW 2. Lymph node, sentinel, biopsy, Left #1 - NO CARCINOMA IDENTIFIED IN ONE LYMPH NODE (0/1) 3. Lymph node, sentinel, biopsy, Left #2 - NO CARCINOMA IDENTIFIED IN ONE LYMPH NODE (0/1)   10/15/2019 Survivorship   Per [name], NP      CURRENT THERAPY: Surveillance  INTERVAL HISTORY: Ms. Arrazola returns for follow-up as scheduled, last seen by Dr. Burr Medico 07/14/2021.  Screening breast MRI  11/15/2021 and right mammo 05/18/2022 were both negative.  She is doing well overall, denies changes in her health.  She remains active, live music,  support groups, etc.  Denies right breast or concerns at the left chest wall.  She needs a letter for Medicare documenting she is 3 years out from her diagnosis.  All other systems were reviewed with the patient and are negative.  MEDICAL HISTORY:  Past Medical History:  Diagnosis Date   Cancer ([place]) 05/2019   left breast DCIS   Seizures ([place])    Vision abnormalities     SURGICAL HISTORY: Past Surgical History:  Procedure Laterality Date   COLONOSCOPY     MASTECTOMY Left    MASTECTOMY W/ SENTINEL NODE BIOPSY Left 06/19/2019   Procedure: LEFT MASTECTOMY WITH SENTINEL LYMPH NODE BIOPSY;  Surgeon: Stark Klein, MD;  Location: [facility];  Service: General;  Laterality: Left;   WISDOM TOOTH EXTRACTION      I have reviewed the social history and family history with the patient and they are unchanged from previous note.  ALLERGIES:  is allergic to penicillins.  MEDICATIONS:  Current Outpatient Medications  Medication Sig Dispense Refill   Ascorbic Acid (VITAMIN C) 100 MG tablet Take 100 mg by mouth daily.     Calcium Citrate (CITRACAL PO) Take by mouth. +vit d     cetirizine (ZYRTEC) 10 MG tablet Take 10 mg by mouth daily.     cholecalciferol (VITAMIN D) 1000 units tablet Take 2,000 Units by mouth daily.     gabapentin (NEURONTIN) 100 MG capsule TAKE 1 OR 2 CAPSULES AT BEDTIME 180 capsule 4   Oxcarbazepine (TRILEPTAL) 300 MG tablet Take 1 and 1/2 tablet in the morning and 2 tablets in the evening. 360 tablet 4   UNABLE TO FIND 500 mg. Med Name: Tumeric Curcumin     zinc gluconate 50 MG tablet Take 50 mg by mouth daily.     No current facility-administered medications for this visit.    PHYSICAL EXAMINATION: ECOG PERFORMANCE STATUS: 0 - Asymptomatic  Vitals:   07/26/22 1241  BP: 121/81  Pulse: 94  Resp: 16  Temp: 97.6 F (36.4 C)  SpO2: 94%   Filed Weights   07/26/22 1241  Weight: 128 lb 9.6 oz (58.3 kg)    GENERAL:alert, no distress and  comfortable SKIN: No rash EYES: sclera clear NECK: Without mass LYMPH:  no palpable cervical or supraclavicular lymphadenopathy LUNGS: normal breathing effort HEART:  no lower extremity edema ABDOMEN:abdomen soft, non-tender and normal bowel sounds Musculoskeletal: No focal spinal tenderness NEURO: alert & oriented x 3 with fluent speech, no focal motor/sensory deficits Breast exam: S/p left mastectomy, incision completely healed with scar tissue at the inferior edge.  Right nipple without discharge or inversion.  No palpable mass or nodularity in the right breast, left chest wall, or either axilla that I could appreciate.  LABORATORY DATA:  I have reviewed the data as listed    Latest Ref Rng & Units 02/23/2022   11:48 AM 07/14/2021    1:22 PM 02/22/2021   10:48 AM  CBC  WBC 3.4 - 10.8 x10E3/uL 6.1  4.6  4.8   Hemoglobin 11.1 - 15.9 g/dL 14.3  13.0  13.5   Hematocrit 34.0 - 46.6 % 43.8  39.8  41.8   Platelets 150 - 450 x10E3/uL 303  257  283         Latest Ref Rng & Units 02/23/2022   11:48 AM 07/14/2021  1:22 PM 02/22/2021   10:48 AM  CMP  Glucose 70 - 99 mg/dL 91  68  77   BUN 8 - 27 mg/dL _0 Creatinine 0.57 - 1.00 mg/dL 0.92  0.87  0.93   Sodium 134 - 144 mmol/L 145  142  142   Potassium 3.5 - 5.2 mmol/L 4.6  4.0  4.4   Chloride 96 - 106 mmol/L 101  107  102   CO2 20 - 29 mmol/L _1 Calcium 8.7 - 10.3 mg/dL 10.5  9.4  9.9   Total Protein 6.0 - 8.5 g/dL 8.0  7.4  7.3   Total Bilirubin 0.0 - 1.2 mg/dL 0.3  0.4  0.3   Alkaline Phos 44 - 121 IU/L 75  69  73   AST 0 - 40 IU/L _2 ALT 0 - 32 IU/L _3 RADIOGRAPHIC STUDIES: I have personally reviewed the radiological images as listed and agreed with the findings in the report. No results found.   ASSESSMENT & PLAN:  ANAHLIA ISEMINGER is a 66 y.o. female with      1. Ductal carcinoma in situ (DCIS) of left breast, Stage 0, pTisN0M0, ER-/PR-, High Grade -Diagnosed in 05/2019. Her  Breast MRI showed another left breast mass anterior to the known mass also DCIS, ER/PR negative.  -Given the extent of the cancer in her left breast she underwent left Mastectomy on 06/19/19 with Dr. Barry Dienes.  -post-radiation mastectomy was not recommended  -given ER/PR negative disease, she would not benefit from anti-estrogen therapy  -given her high risk for future breast cancer and dense tissue cat C, she is under close surveillance with right mammogram alternating with screening breast MRI 6 months apart -Ms. Biggar is clinically doing well.  Breast exam is benign, recent mammo and MRI from December are negative.  No clinical concern for recurrent or new breast cancer -She is over 3 years from initial diagnosis and definitive surgery, continue surveillance/[place] -Next breast MRI 11/2022 and right mammo 05/2023.  We discussed spacing out her MRIs in the future but we will continue this plan for now -I sent her a letter to [place] for Medicare -I encouraged her to continue healthy active lifestyle and age-appropriate health maintenance -Follow-up in 1 year, or sooner if needed   2. Left breast and arm numbness and Lymphedema -Secondary to surgery  -completed PT -sensation returning unequally throughout the chest wall      3. Seizures/Epilepsy -She is on Trileptal and Gabapentin  -She will continue to follow up with neurologist.   4.  Social support -Her husband of over 39 years died last year, she is in a good place. - Uses social media outlets and breast cancer support groups for connections -Active, goes to a lot of concerts -emotional support provided  PLAN -Reviewed 11/2021 breast MRI and 05/2022 right mammogram, negative -Continue surveillance/high risk screening program -Next MRI 11/2022 and right mammo 05/2023 -Follow-up in 1 year, or sooner if needed -Letter for Medicare sent to [place] This Encounter  Procedures   MR BREAST BILATERAL W [place]  CAD    Standing Status:   Future    Standing Expiration Date:   07/27/2023    Order Specific Question:   If indicated for the ordered procedure, I authorize the administration of contrast media per Radiology  protocol    Answer:   Yes    Order Specific Question:   What is the patient's sedation requirement?    Answer:   No Sedation    Order Specific Question:   Does the patient have a pacemaker or implanted devices?    Answer:   No    Order Specific Question:   Preferred imaging location?    Answer:   [facility] (table limit - 550 lbs)   All questions were answered. The patient knows to call the clinic with any problems, questions or concerns. No barriers to learning were detected.      Alla Feeling, NP 07/26/22

## 2022-07-26 ENCOUNTER — Inpatient Hospital Stay: Attending: Nurse Practitioner | Admitting: Nurse Practitioner

## 2022-07-26 ENCOUNTER — Other Ambulatory Visit: Payer: Self-pay

## 2022-07-26 ENCOUNTER — Encounter: Payer: Self-pay | Admitting: Nurse Practitioner

## 2022-07-26 VITALS — BP 121/81 | HR 94 | Temp 97.6°F | Resp 16 | Wt 128.6 lb

## 2022-07-26 DIAGNOSIS — Z86000 Personal history of in-situ neoplasm of breast: Secondary | ICD-10-CM | POA: Diagnosis present

## 2022-07-26 DIAGNOSIS — Z9012 Acquired absence of left breast and nipple: Secondary | ICD-10-CM | POA: Insufficient documentation

## 2022-07-26 DIAGNOSIS — D0512 Intraductal carcinoma in situ of left breast: Secondary | ICD-10-CM

## 2022-07-27 ENCOUNTER — Telehealth: Payer: Self-pay | Admitting: Hematology

## 2022-07-27 NOTE — Telephone Encounter (Signed)
Scheduled follow-up appointment per 8/23 los. Patient is aware.

## 2022-11-17 LAB — HM COLONOSCOPY

## 2022-12-14 ENCOUNTER — Other Ambulatory Visit: Payer: Self-pay

## 2022-12-14 DIAGNOSIS — D0512 Intraductal carcinoma in situ of left breast: Secondary | ICD-10-CM

## 2022-12-14 NOTE — Progress Notes (Signed)
Pt called stating her MRI Breast Bilateral has always been done at [facility] on [address] but when she contacted them to get it scheduled she was informed that they did not have an order and to contact the provider.  Original order was placed for [facility]; therefore, reordered to preferred location.  Epic faxed order to [facility] [address].  Asked for [facility] to contact pt to get her scheduled.  Pt will contact them if not contacted within the next 2 to 3 business days.  Epic fax confirmation received.

## 2022-12-28 ENCOUNTER — Ambulatory Visit
Admission: RE | Admit: 2022-12-28 | Discharge: 2022-12-28 | Disposition: A | Discharge status: Home / Self Care | Payer: Medicare PPO | Source: Ambulatory Visit | Attending: Nurse Practitioner | Admitting: Nurse Practitioner

## 2022-12-28 DIAGNOSIS — D0512 Intraductal carcinoma in situ of left breast: Secondary | ICD-10-CM

## 2022-12-28 MED ORDER — GADOPICLENOL 0.5 MMOL/ML IV SOLN
6.0000 mL | Freq: Once | INTRAVENOUS | Status: AC | PRN
  Administered 2022-12-28: 6 mL via INTRAVENOUS

## 2023-03-05 ENCOUNTER — Ambulatory Visit (INDEPENDENT_AMBULATORY_CARE_PROVIDER_SITE_OTHER): Payer: Medicare PPO | Admitting: Nurse Practitioner

## 2023-03-05 ENCOUNTER — Encounter: Payer: Self-pay | Admitting: Nurse Practitioner

## 2023-03-05 VITALS — BP 118/72 | HR 80 | Temp 98.0°F | Ht 66.0 in | Wt 131.0 lb

## 2023-03-05 DIAGNOSIS — G40209 Localization-related (focal) (partial) symptomatic epilepsy and epileptic syndromes with complex partial seizures, not intractable, without status epilepticus: Secondary | ICD-10-CM

## 2023-03-05 DIAGNOSIS — E7800 Pure hypercholesterolemia, unspecified: Secondary | ICD-10-CM | POA: Diagnosis not present

## 2023-03-05 DIAGNOSIS — K5900 Constipation, unspecified: Secondary | ICD-10-CM | POA: Insufficient documentation

## 2023-03-05 DIAGNOSIS — Z0000 Encounter for general adult medical examination without abnormal findings: Secondary | ICD-10-CM

## 2023-03-05 DIAGNOSIS — Z800 Family history of malignant neoplasm of digestive organs: Secondary | ICD-10-CM | POA: Insufficient documentation

## 2023-03-05 DIAGNOSIS — R7309 Other abnormal glucose: Secondary | ICD-10-CM | POA: Diagnosis not present

## 2023-03-05 DIAGNOSIS — E559 Vitamin D deficiency, unspecified: Secondary | ICD-10-CM | POA: Diagnosis not present

## 2023-03-05 DIAGNOSIS — Z2821 Immunization not carried out because of patient refusal: Secondary | ICD-10-CM

## 2023-03-05 NOTE — Patient Instructions (Signed)
  Sally Nguyen , Thank you for taking time to come for your Medicare Wellness Visit. I appreciate your ongoing commitment to your health goals. Please review the following plan we discussed and let me know if I can assist you in the future.   These are the goals we discussed:  Goals      Weight (lb) < 130 lb (59 kg)     She would like to lose 5-10 lbs in the next year.         This is a list of the screening recommended for you and due dates:  Health Maintenance  Topic Date Due   Pneumonia Vaccine (1 of 1 - PCV) 03/04/2025*   Zoster (Shingles) Vaccine (1 of 2) 03/04/2044*   Flu Shot  07/05/2023   DTaP/Tdap/Td vaccine (2 - Td or Tdap) 12/17/2023   Medicare Annual Wellness Visit  03/04/2024   Mammogram  12/28/2024   Pap Smear  02/23/2025   Colon Cancer Screening  11/18/2027   DEXA scan (bone density measurement)  Completed   Hepatitis C Screening: USPSTF Recommendation to screen - Ages 73-79 yo.  Completed   HIV Screening  Completed   HPV Vaccine  Aged Out   COVID-19 Vaccine  Discontinued  *Topic was postponed. The date shown is not the original due date.

## 2023-03-05 NOTE — Progress Notes (Signed)
I,Sheena H Holbrook,acting as a Neurosurgeon for Arnette Felts, FNP.,have documented all relevant documentation on the behalf of Arnette Felts, FNP,as directed by  Arnette Felts, FNP while in the presence of Arnette Felts, FNP.   Subjective:     Patient ID: Sally Nguyen , female    DOB: 1956-05-03 , 67 y.o.   MRN: [id number]   Chief Complaint  Patient presents with   Annual Exam    HPI  Patient presents today for annual exam.       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 +vit d, Disp: , Rfl:    cetirizine (ZYRTEC) 10 MG tablet, Take 10 mg by mouth daily., Disp: , Rfl:    cholecalciferol (VITAMIN D) 1000 units tablet, Take 2,000 Units by mouth daily., Disp: , Rfl:    gabapentin (NEURONTIN) 100 MG capsule, TAKE 1 OR 2 CAPSULES AT BEDTIME, Disp: 180 capsule, Rfl: 4   Oxcarbazepine (TRILEPTAL) 300 MG tablet, Take 1 and 1/2 tablet in the morning and 2 tablets in the evening., Disp: 360 tablet, Rfl: 4   UNABLE TO FIND, 500 mg. Med Name: Tumeric Curcumin, Disp: , Rfl:    zinc gluconate 50 MG tablet, Take 50 mg by mouth daily., Disp: , Rfl:    Allergies  Allergen Reactions   Penicillins Rash      The patient states she uses post menopausal status for birth control. Last LMP was No LMP recorded. Patient is postmenopausal.. Negative for Dysmenorrhea and Negative for Menorrhagia. Negative for: breast discharge, breast lump(s), breast pain and breast self exam. Associated symptoms include abnormal vaginal bleeding. Pertinent negatives include abnormal bleeding (hematology), anxiety, decreased  libido, depression, difficulty falling sleep, dyspareunia, history of infertility, nocturia, sexual dysfunction, sleep disturbances, urinary incontinence, urinary urgency, vaginal discharge and vaginal itching. Diet regular.The patient states her exercise level is Current Exercise Habits: The patient does not participate in regular exercise at present Exercise limited by: None identified. The patient's tobacco use is:  Social History   Tobacco Use  Smoking Status Never  Smokeless Tobacco Never  . She has been exposed to passive smoke. The patient's alcohol use is:  Social History   Substance and Sexual Activity  Alcohol Use No  . Additional information: Last pap 02/23/2022, next one scheduled for 02/23/2025.    Review of Systems  Constitutional: Negative.   HENT: Negative.    Eyes: Negative.   Respiratory: Negative.    Cardiovascular: Negative.   Gastrointestinal: Negative.   Endocrine: Negative.   Genitourinary: Negative.   Musculoskeletal: Negative.   Skin: Negative.   Allergic/Immunologic: Negative.   Neurological: Negative.   Hematological: Negative.   Psychiatric/Behavioral: Negative.       Today's Vitals   03/05/23 1012  BP: 118/72  Pulse: 80  Temp: 98 F (36.7 C)  TempSrc: Oral  SpO2: 96%  Weight: 131 lb (59.4 kg)  Height: 5\' 6"  (1.676 m)   Body mass index is 21.14 kg/m.  Objective:  Physical Exam Vitals reviewed. Exam conducted with a chaperone present.  Constitutional:      General: She is not in acute distress.    Appearance: Normal appearance. She is well-developed and normal weight.  HENT:     Head: Normocephalic and atraumatic.     Right Ear: Hearing, tympanic membrane, ear canal and external ear normal. There is no impacted cerumen.     Left Ear: Hearing, tympanic membrane, ear canal and external ear normal. There is no impacted cerumen.     Nose: Nose normal.     Mouth/Throat:     Mouth: Mucous membranes are moist.  Eyes:     General: Lids are  normal.     Extraocular Movements: Extraocular movements intact.     Conjunctiva/sclera: Conjunctivae normal.     Pupils: Pupils are equal, round, and reactive to light.     Funduscopic exam:    Right eye: No papilledema.        Left eye: No papilledema.  Neck:     Thyroid: No thyroid mass.     Vascular: No carotid bruit.  Cardiovascular:     Rate and Rhythm: Normal rate and regular rhythm.     Pulses: Normal pulses.     Heart sounds: Normal heart sounds. No murmur heard. Pulmonary:     Effort: Pulmonary effort is normal. No respiratory distress.     Breath sounds: Normal breath sounds. No wheezing.  Chest:     Chest wall: No mass.  Breasts:    Tanner Score is 5.     Right: Normal. No mass or tenderness.     Left: Absent. No mass or tenderness.     Comments: Surgical scar to left breast Abdominal:     General: Abdomen is flat. Bowel sounds are normal. There is no distension.     Palpations: Abdomen is soft.     Tenderness: There is no abdominal tenderness.     Hernia: There is no hernia in the left inguinal area or right inguinal area.  Genitourinary:    Exam position: Lithotomy position.     Tanner stage (genital): 5.     Labia:        Right: No rash or tenderness.        Left: No rash or tenderness.      Urethra: No prolapse or urethral swelling.     Vagina: Normal.     Cervix: Normal.     Uterus: Normal.      Adnexa: Right adnexa normal and left adnexa normal.       Right: No mass or tenderness.         Left: No mass or tenderness.    Musculoskeletal:        General: No swelling. Normal range of motion.     Cervical back: Full passive range of motion without pain, normal range of motion and neck supple.     Right lower leg: No edema.     Left lower leg: No edema.  Lymphadenopathy:     Upper Body:     Right upper body: No supraclavicular, axillary or pectoral adenopathy.     Left upper body: No supraclavicular, axillary or pectoral adenopathy.  Skin:    General:  Skin is warm and dry.     Capillary Refill: Capillary refill takes less than 2 seconds.  Neurological:     General: No focal deficit present.     Mental Status: She is alert and oriented to  person, place, and time.     Cranial Nerves: No cranial nerve deficit.     Sensory: No sensory deficit.     Motor: No weakness.  Psychiatric:        Mood and Affect: Mood normal.        Behavior: Behavior normal.        Thought Content: Thought content normal.        Judgment: Judgment normal.         Assessment And Plan:     1. Encounter for Medicare annual wellness exam Pt's annual wellness exam was performed and geriatric assessment reviewed.  Pt has no new identiafble wellness concerns at this time.  WIll obtain routine labs.  Will obtain UA and micro.  Behavior modifications discussed and diet history reviewed. Pt will continue to exercise regularly and modify diet, with low GI, plant based foods and decrease food intake of processed foods.  Recommend intake of daily multivitamin, Vitamin D, and calcium. Recommond mammogram and colonoscopy for preventive screenings, as well as recommend immunizations that include influenza (up to date) and TDAP - CBC - CMP14+EGFR  2. Vitamin D deficiency Will check vitamin D level and supplement as needed.    Also encouraged to spend 15 minutes in the sun daily.  - VITAMIN D 25 Hydroxy (Vit-D Deficiency, Fractures)  3. Abnormal glucose Comments: HgbA1c is stable, diet controlled. - Hemoglobin A1c  4. Elevated cholesterol Comments: Cholesterol levels are stable, no current medications. Encouraged to eat a low fat diet and increase fiber intake. - Lipid panel  5. Pneumococcal vaccination declined  6. Herpes zoster vaccination declined Declines shingrix, educated on disease process and is aware if he changes his mind to notify office  7. Partial symptomatic epilepsy with complex partial seizures, not intractable, without status  epilepticus Comments: Continue f/u with Neurology, no recent seizures   Patient was given opportunity to ask questions. Patient verbalized understanding of the plan and was able to repeat key elements of the plan. All questions were answered to their satisfaction.   Arnette Felts, FNP   I, Arnette Felts, FNP, have reviewed all documentation for this visit. The documentation on 03/05/23 for the exam, diagnosis, procedures, and orders are all accurate and complete.   THE PATIENT IS ENCOURAGED TO PRACTICE SOCIAL DISTANCING DUE TO THE COVID-19 PANDEMIC.    Subjective:    Sally Nguyen is a 67 y.o. female who presents for a Welcome to Medicare exam.   Review of Systems See above Cardiac Risk Factors include: advanced age (>80men, >41 women)      Objective:    Today's Vitals   03/05/23 1012  BP: 118/72  Pulse: 80  Temp: 98 F (36.7 C)  TempSrc: Oral  SpO2: 96%  Weight: 131 lb (59.4 kg)  Height:  (1.676 m)  Body mass index is 21.14 kg/m.  Medications Outpatient Encounter Medications as of 03/05/2023  Medication Sig   Ascorbic Acid (VITAMIN C) 100 MG tablet Take 100 mg by mouth daily.   Calcium Citrate (CITRACAL PO) Take by mouth. +vit d   cetirizine (ZYRTEC) 10 MG tablet Take 10 mg by mouth daily.   cholecalciferol (VITAMIN D) 1000 units tablet Take 2,000 Units by mouth daily.   gabapentin (NEURONTIN) 100 MG capsule TAKE 1 OR 2 CAPSULES AT BEDTIME   Oxcarbazepine (TRILEPTAL) 300 MG tablet Take 1 and 1/2 tablet in the morning and 2 tablets in the evening.   UNABLE TO FIND 500 mg. Med Name: Tumeric  Curcumin   zinc gluconate 50 MG tablet Take 50 mg by mouth daily.   No facility-administered encounter medications on file as of 03/05/2023.     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   Seizures    Vision abnormalities    Past Surgical History:  Procedure Laterality Date   COLONOSCOPY     MASTECTOMY Left    MASTECTOMY W/  SENTINEL NODE BIOPSY Left 06/19/2019   Procedure: LEFT MASTECTOMY WITH SENTINEL LYMPH NODE BIOPSY;  Surgeon: Almond Lint, MD;  Location: [facility];  Service: General;  Laterality: Left;   WISDOM TOOTH EXTRACTION      Family History  Problem Relation Age of Onset   Liver cancer Mother    Colon cancer Mother        41   Atrial fibrillation Father    Obesity Sister    Social History   Occupational History   Not on file  Tobacco Use   Smoking status: Never   Smokeless tobacco: Never  Substance and Sexual Activity   Alcohol use: No   Drug use: No   Sexual activity: Not on file    Tobacco Counseling Counseling given: Not Answered   Immunizations and Health Maintenance Immunization History  Administered Date(s) Administered   Influenza,inj,Quad PF,6+ Mos 10/04/2017, 09/20/2018, 09/04/2019   Tdap 12/16/2013   There are no preventive care reminders to display for this patient.   Activities of Daily Living    03/05/2023   11:06 AM  In your present state of health, do you have any difficulty performing the following activities:  Hearing? 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 0  Difficulty concentrating or making decisions? 0  Walking or climbing stairs? 0  Dressing or bathing? 0  Doing errands, shopping? 0  Preparing Food and eating ? N  Using the Toilet? N  In the past six months, have you accidently leaked urine? N  Do you have problems with loss of bowel control? N  Managing your Medications? N  Managing your Finances? N  Housekeeping or managing your Housekeeping? N    Physical Exam  (optional), or other factors deemed appropriate based on the beneficiary's medical and social history and current clinical standards.  Advanced Directives: Does Patient Have a Medical Advance Directive?: No Would patient like information on creating a medical advance directive?: Yes (MAU/Ambulatory/Procedural Areas - Information given)    Assessment:     This is a routine wellness examination for this patient .   Vision/Hearing screen Hearing Screening  Method: Audiometry   500Hz  1000Hz  2000Hz  4000Hz   Right ear Pass Pass Pass Fail  Left ear Pass Pass Pass Fail   Vision Screening   Right eye Left eye Both eyes  Without correction 20/30 20/30 20/30   With correction       Dietary issues and exercise activities discussed:  Current Exercise Habits: The patient does not participate in regular exercise at present, Exercise limited by: None identified   Goals      Weight (lb) < 130 lb (59 kg)     She would like to lose 5-10 lbs in the next year.        Depression Screen    03/05/2023   10:51 AM 03/05/2023   10:10 AM 02/23/2022   10:44 AM 02/22/2021   10:04 AM  PHQ 2/9 Scores  PHQ - 2 Score 0 0 0 0  PHQ- 9 Score  0      Fall Risk    03/05/2023   11:08 AM  Fall Risk   Falls in the past year? 0    Cognitive Function:        03/05/2023   11:08 AM  6CIT Screen  What Year? 0 points  What month? 0 points  What time? 0 points  Count back from 20 0 points  Months in reverse 0 points  Repeat phrase 0 points  Total Score 0 points    Patient Care Team: Arnette Felts, FNP as PCP - General (General Practice) Pershing Proud, RN as Oncology Nurse Navigator Donnelly Angelica, RN as Oncology Nurse Navigator Almond Lint, MD as Consulting Physician (General Surgery) Malachy Mood, MD as Consulting Physician (Hematology) Lonie Peak, MD as Attending Physician (Radiation Oncology) Pollyann Samples, NP as Nurse Practitioner (Nurse Practitioner) [facility] as Consulting Physician (Radiology)     Plan:   Pt's annual wellness exam was performed and geriatric assessment reviewed.  Pt has no new identiafble wellness concerns at this time.  WIll obtain routine labs.  Will obtain UA and micro.  Behavior modifications discussed and diet history reviewed. Pt will continue to exercise regularly and modify  diet, with low GI, plant based foods and decrease food intake of processed foods.  Recommend intake of daily multivitamin, Vitamin D, and calcium. Recommond mammogram and colonoscopy for preventive screenings, as well as recommend immunizations that include influenza (up to date) and TDAP   I have personally reviewed and noted the following in the patient's chart:   Medical and social history Use of alcohol, tobacco or illicit drugs  Current medications and supplements Functional ability and status Nutritional status Physical activity Advanced directives List of other physicians Hospitalizations, surgeries, and ER visits in previous 12 months Vitals Screenings to include cognitive, depression, and falls Referrals and appointments  In addition, I have reviewed and discussed with patient certain preventive protocols, quality metrics, and best practice recommendations. A written personalized care plan for preventive services as well as general preventive health recommendations were provided to patient.     Arnette Felts, FNP 03/11/2023

## 2023-03-06 LAB — CBC
Hematocrit: 42 % (ref 34.0–46.6)
Hemoglobin: 13.5 g/dL (ref 11.1–15.9)
MCH: 28.5 pg (ref 26.6–33.0)
MCHC: 32.1 g/dL (ref 31.5–35.7)
MCV: 89 fL (ref 79–97)
Platelets: 267 10*3/uL (ref 150–450)
RBC: 4.73 x10E6/uL (ref 3.77–5.28)
RDW: 13 % (ref 11.7–15.4)
WBC: 5.5 10*3/uL (ref 3.4–10.8)

## 2023-03-06 LAB — HEMOGLOBIN A1C
Est. average glucose Bld gHb Est-mCnc: 120 mg/dL
Hgb A1c MFr Bld: 5.8 % — ABNORMAL HIGH (ref 4.8–5.6)

## 2023-03-06 LAB — VITAMIN D 25 HYDROXY (VIT D DEFICIENCY, FRACTURES): Vit D, 25-Hydroxy: 61.1 ng/mL (ref 30.0–100.0)

## 2023-03-06 LAB — CMP14+EGFR
ALT: 14 IU/L (ref 0–32)
AST: 16 IU/L (ref 0–40)
Albumin/Globulin Ratio: 1.7 (ref 1.2–2.2)
Albumin: 4.5 g/dL (ref 3.9–4.9)
Alkaline Phosphatase: 75 IU/L (ref 44–121)
BUN/Creatinine Ratio: 13 (ref 12–28)
BUN: 13 mg/dL (ref 8–27)
Bilirubin Total: 0.2 mg/dL (ref 0.0–1.2)
CO2: 24 mmol/L (ref 20–29)
Calcium: 9.8 mg/dL (ref 8.7–10.3)
Chloride: 102 mmol/L (ref 96–106)
Creatinine, Ser: 0.98 mg/dL (ref 0.57–1.00)
Globulin, Total: 2.6 g/dL (ref 1.5–4.5)
Glucose: 89 mg/dL (ref 70–99)
Potassium: 4.6 mmol/L (ref 3.5–5.2)
Sodium: 140 mmol/L (ref 134–144)
Total Protein: 7.1 g/dL (ref 6.0–8.5)
eGFR: 64 mL/min/{1.73_m2} (ref >59)

## 2023-03-06 LAB — LIPID PANEL
Chol/HDL Ratio: 2.9 ratio (ref 0.0–4.4)
Cholesterol, Total: 207 mg/dL — ABNORMAL HIGH (ref 100–199)
HDL: 71 mg/dL (ref >39)
LDL Chol Calc (NIH): 120 mg/dL — ABNORMAL HIGH (ref 0–99)
Triglycerides: 89 mg/dL (ref 0–149)
VLDL Cholesterol Cal: 16 mg/dL (ref 5–40)

## 2023-05-16 ENCOUNTER — Encounter: Payer: Self-pay | Admitting: Family Medicine

## 2023-05-16 ENCOUNTER — Ambulatory Visit (INDEPENDENT_AMBULATORY_CARE_PROVIDER_SITE_OTHER): Payer: Medicare PPO | Admitting: Family Medicine

## 2023-05-16 VITALS — BP 109/74 | HR 93 | Ht 66.0 in | Wt 129.0 lb

## 2023-05-16 DIAGNOSIS — G40209 Localization-related (focal) (partial) symptomatic epilepsy and epileptic syndromes with complex partial seizures, not intractable, without status epilepticus: Secondary | ICD-10-CM

## 2023-05-16 MED ORDER — GABAPENTIN 100 MG PO CAPS: ORAL_CAPSULE | ORAL | 4 refills | Status: DC

## 2023-05-16 MED ORDER — OXCARBAZEPINE 300 MG PO TABS: ORAL_TABLET | ORAL | 4 refills | Status: DC

## 2023-05-16 NOTE — Progress Notes (Signed)
PATIENT: Sally Nguyen DOB: 01-21-1956  REASON FOR VISIT: follow up HISTORY FROM: patient  Chief Complaint  Patient presents with   Follow-up    RM 1. Last seen 05/10/2022. Last seizure was many years ago, no concerns or problems to report     HISTORY OF PRESENT ILLNESS:  05/16/23 ALL:  Sally Nguyen returns for follow up for seizures. She continues oxcarb 450/600 and gabapentin 100-200mg  QHS. She is doing well. No seizure activity. She is tolerating meds well. She continues to drive without difficulty. She is followed annually by PCP. She is taking vitamin D and calcium supplement.   05/10/2022 ALL: Sally Nguyen returns for follow up for seizures. She continues oxcarbazepine 450/600. Gabapentin 100-200mg  at bedtime helps with sleep. She reports doing really well. She denies seizure activity. No auras. Tolerating meds well. She is sleeping well. She lives with her son. She drives without difficulty. She is taking vitamin D and calcium supplement. She is followed by PCP regularly. Labs reviewed in Epic.   05/03/2021 ALL: Sally Nguyen returns for follow up for seizures. She continues oxcarbazepine 450mg  in am and 600mg  in pm. She also continues gabapentin 100-200mg  at bedtime for delayed sleep phase. She feels that she is doing well. She lost her husband in April and feels stress levels have been higher but she is doing ok. No seizure activity. She has not slept as well but feels she is getting back into a normal schedule. She is followed regularly by PCP. She continues vitamin D and calcium supplements.   04/27/2020 ALL:  Sally Nguyen is a 67 y.o. female here today for follow up for seizures and delayed sleep phase. She continues oxcarbamazepine 450mg  in am and 600mg  in pm. Last seizure in 2004. She continues gabapentin 100-200mg  at bedtime which helps with sleep. She was diagnosed with left sided breast cancer. She had a left mastectomy on 06/19/2019 but did not require any additional treatment. She is  followed regularly by oncology. Labs with PCP normal in 02/2020.   HISTORY: (copied from Dr Bonnita Hollow note on 05/14/2019)  She denies any more seizures.  Her last seizure was in 2004.  Prior to that, she was having about 1-2 seizures per year.  She tolerates the oxcarbazepine well. Current dose is 450 mg qAM and 600 mg qHS. In 2018, the blood level was low normal.  Her last EEG was in 2017 and showed hypersynchronous activity in the right temporal region that could be consistent with a seizure disorder.  An EEG in 2016 showed a left temporal focus.  She reports that most EEGs have shown some abnormality.   Her second issue is a mild sleep disturbance.  She has delayed phase sleep syndrome usually going to bed around 2 AM hours of sleep.  When she falls asleep she does well.  Adding the gabapentin before bedtime has helped her fall asleep and stay asleep better.Marland Kitchen   REVIEW OF SYSTEMS: Out of a complete 14 system review of symptoms, the patient complains only of the following symptoms, insomnia and all other reviewed systems are negative.   ALLERGIES: Allergies  Allergen Reactions   Penicillins Rash    HOME MEDICATIONS: Outpatient Medications Prior to Visit  Medication Sig Dispense Refill   Ascorbic Acid (VITAMIN C) 100 MG tablet Take 100 mg by mouth daily.     Calcium Citrate (CITRACAL PO) Take by mouth. +vit d     cetirizine (ZYRTEC) 10 MG tablet Take 10 mg by mouth daily.  cholecalciferol (VITAMIN D) 1000 units tablet Take 2,000 Units by mouth daily.     UNABLE TO FIND 500 mg. Med Name: Tumeric Curcumin     zinc gluconate 50 MG tablet Take 50 mg by mouth daily.     gabapentin (NEURONTIN) 100 MG capsule TAKE 1 OR 2 CAPSULES AT BEDTIME 180 capsule 4   Oxcarbazepine (TRILEPTAL) 300 MG tablet Take 1 and 1/2 tablet in the morning and 2 tablets in the evening. 360 tablet 4   No facility-administered medications prior to visit.    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   Seizures (HCC)    Vision abnormalities     PAST SURGICAL HISTORY: Past Surgical History:  Procedure Laterality Date   COLONOSCOPY     MASTECTOMY Left    MASTECTOMY W/ SENTINEL NODE BIOPSY Left 06/19/2019   Procedure: LEFT MASTECTOMY WITH SENTINEL LYMPH NODE BIOPSY;  Surgeon: Almond Lint, MD;  Location: [facility];  Service: General;  Laterality: Left;   WISDOM TOOTH EXTRACTION      FAMILY HISTORY: Family History  Problem Relation Age of Onset   Liver cancer Mother    Colon cancer Mother        3   Atrial fibrillation Father    Obesity Sister     SOCIAL HISTORY: Social History   Socioeconomic History   Marital status: Widowed    Spouse name: Not on file   Number of children: 1   Years of education: Not on file   Highest education level: Not on file  Occupational History   Not on file  Tobacco Use   Smoking status: Never   Smokeless tobacco: Never  Vaping Use   Vaping Use: Never used  Substance and Sexual Activity   Alcohol use: No   Drug use: No   Sexual activity: Not Currently    Birth control/protection: Post-menopausal  Other Topics Concern   Not on file  Social History Narrative   Not on file   Social Determinants of Health   Financial Resource Strain: Low Risk  (03/05/2023)   Overall Financial Resource Strain (CARDIA)    Difficulty of Paying Living Expenses: Not hard at all  Food Insecurity: No Food Insecurity (03/05/2023)   Hunger Vital Sign    Worried About Running Out of Food in the Last Year: Never true    Ran Out of Food in the Last Year: Never true  Transportation Needs: No Transportation Needs (03/05/2023)   PRAPARE - Administrator, Civil Service (Medical): No    Lack of Transportation (Non-Medical): No  Physical Activity: Insufficiently Active (03/05/2023)   Exercise Vital Sign    Days of Exercise per Week: 1 day    Minutes of Exercise per Session: 10 min  Stress: No  Stress Concern Present (03/05/2023)   [employer] of Occupational Health - Occupational Stress Questionnaire    Feeling of Stress : Not at all  Social Connections: Moderately Isolated (03/05/2023)   Social Connection and Isolation Panel [NHANES]    Frequency of Communication with Friends and Family: Twice a week    Frequency of Social Gatherings with Friends and Family: Twice a week    Attends Religious Services: Never    Database administrator or Organizations: Yes    Attends Engineer, structural: More than 4 times per year    Marital Status: Widowed  Intimate Partner Violence: Unknown (03/05/2023)  Humiliation, Afraid, Rape, and Kick questionnaire    Fear of Current or Ex-Partner: Patient declined    Emotionally Abused: Not on file    Physically Abused: Not on file    Sexually Abused: Not on file      PHYSICAL EXAM  Vitals:   05/16/23 1240  BP: 109/74  Pulse: 93  Weight: 129 lb (58.5 kg)  Height: 5\' 6"  (1.676 m)     Body mass index is 20.82 kg/m.  Generalized: Well developed, in no acute distress  Cardiology: normal rate and rhythm, no murmur noted Respiratory: clear to auscultation bilaterally  Neurological examination  Mentation: Alert oriented to time, place, history taking. Follows all commands speech and language fluent Cranial nerve II-XII: Pupils were equal round reactive to light. Extraocular movements were full, visual field were full on confrontational test. Facial sensation and strength were normal. Head turning and shoulder shrug  were normal and symmetric. Motor: The motor testing reveals 5 over 5 strength of all 4 extremities. Good symmetric motor tone is noted throughout.  Sensory: Sensory testing is intact to soft touch on all 4 extremities. No evidence of extinction is noted.  Coordination: Cerebellar testing reveals good finger-nose-finger and heel-to-shin bilaterally.  Gait and station: Gait is normal.    DIAGNOSTIC DATA (LABS, IMAGING,  TESTING) - I reviewed patient records, labs, notes, testing and imaging myself where available.      No data to display           Lab Results  Component Value Date   WBC 5.5 03/05/2023   HGB 13.5 03/05/2023   HCT 42.0 03/05/2023   MCV 89 03/05/2023   PLT 267 03/05/2023      Component Value Date/Time   NA 140 03/05/2023 1122   K 4.6 03/05/2023 1122   CL 102 03/05/2023 1122   CO2 24 03/05/2023 1122   GLUCOSE 89 03/05/2023 1122   GLUCOSE 68 (L) 07/14/2021 1322   BUN 13 03/05/2023 1122   CREATININE 0.98 03/05/2023 1122   CREATININE 0.83 07/12/2020 1049   CALCIUM 9.8 03/05/2023 1122   PROT 7.1 03/05/2023 1122   ALBUMIN 4.5 03/05/2023 1122   AST 16 03/05/2023 1122   AST 11 (L) 07/12/2020 1049   ALT 14 03/05/2023 1122   ALT 8 07/12/2020 1049   ALKPHOS 75 03/05/2023 1122   BILITOT 0.2 03/05/2023 1122   BILITOT 0.3 07/12/2020 1049   GFRNONAA >60 07/14/2021 1322   GFRNONAA >60 07/12/2020 1049   GFRAA >60 07/12/2020 1049   Lab Results  Component Value Date   CHOL 207 (H) 03/05/2023   HDL 71 03/05/2023   LDLCALC 120 (H) 03/05/2023   TRIG 89 03/05/2023   CHOLHDL 2.9 03/05/2023   Lab Results  Component Value Date   HGBA1C 5.8 (H) 03/05/2023   Lab Results  Component Value Date   VITAMINB12 477 02/23/2022   Lab Results  Component Value Date   TSH 2.390 02/23/2022       ASSESSMENT AND PLAN 67 y.o. year old female  has a past medical history of Cancer (HCC) (05/2019), Seizures (HCC), and Vision abnormalities. here with     ICD-10-CM   1. Partial symptomatic epilepsy with complex partial seizures, not intractable, without status epilepticus (HCC)  G40.209 10-Hydroxycarbazepine      Atina is doing very well today.  She continues oxcarbamazepine 450 mg in the morning and 600 mg in the evenings.  No recent seizure activity.  Gabapentin 100 to 200 mg in the evenings helps  with delayed sleep phase.  She is tolerating medications well with no obvious adverse effects.   We will continue current therapy.  Lab work has been reviewed in epic from 03/05/2023. I will update oxcarbazepine levels, today. She requests oxcarb level to be completed with annual labs if PCP willing. I am happy to manage results. She was encouraged to stay well-hydrated, focus on a well-balanced diet and regular exercise.  She will continue close follow-up with primary care and oncology. She will follow-up with me in 1 year, sooner if needed.  She verbalizes understanding and agreement with this plan.   Orders Placed This Encounter  Procedures   10-Hydroxycarbazepine     Meds ordered this encounter  Medications   gabapentin (NEURONTIN) 100 MG capsule    Sig: TAKE 1 OR 2 CAPSULES AT BEDTIME    Dispense:  180 capsule    Refill:  4    Order Specific Question:   Supervising Provider    Answer:   Anson Fret [[id number]]   Oxcarbazepine (TRILEPTAL) 300 MG tablet    Sig: Take 1 and 1/2 tablet in the morning and 2 tablets in the evening.    Dispense:  360 tablet    Refill:  4    Order Specific Question:   Supervising Provider    Answer:   Bernestine Amass, FNP-C 05/16/2023, 12:57 PM [facility] [address] [place], [place] [address] [phone]

## 2023-05-16 NOTE — Patient Instructions (Signed)
Below is our plan:  We will continue oxcarbazepine 450mg  in am and 600mg  in the evenings with gabapentin 100-200mg  at bedtime.   Please make sure you are consistent with timing of seizure medication. I recommend annual visit with primary care provider (PCP) for complete physical and routine blood work. I recommend daily intake of vitamin D (400-800iu) and calcium (800-1000mg ) for bone health. Discuss Dexa screening with PCP.   According to [place] law, you can not drive unless you are seizure / syncope free for at least 6 months and under physician's care.  Please maintain precautions. Do not participate in activities where a loss of awareness could harm you or someone else. No swimming alone, no tub bathing, no hot tubs, no driving, no operating motorized vehicles (cars, ATVs, motocycles, etc), lawnmowers, power tools or firearms. No standing at heights, such as rooftops, ladders or stairs. Avoid hot objects such as stoves, heaters, open fires. Wear a helmet when riding a bicycle, scooter, skateboard, etc. and avoid areas of traffic. Set your water heater to 120 degrees or less.  Please make sure you are staying well hydrated. I recommend 50-60 ounces daily. Well balanced diet and regular exercise encouraged. Consistent sleep schedule with 6-8 hours recommended.   Please continue follow up with care team as directed.   Follow up with me in 1 year   You may receive a survey regarding today's visit. I encourage you to leave honest feed back as I do use this information to improve patient care. Thank you for seeing me today!

## 2023-05-22 LAB — 10-HYDROXYCARBAZEPINE: Oxcarbazepine SerPl-Mcnc: 18 ug/mL (ref 10–35)

## 2023-07-30 ENCOUNTER — Encounter: Payer: Self-pay | Admitting: Adult Health

## 2023-07-30 ENCOUNTER — Inpatient Hospital Stay: Payer: Medicare PPO | Attending: Hematology | Admitting: Adult Health

## 2023-07-30 VITALS — BP 114/68 | HR 92 | Temp 98.1°F | Resp 19 | Wt 133.1 lb

## 2023-07-30 DIAGNOSIS — D0512 Intraductal carcinoma in situ of left breast: Secondary | ICD-10-CM | POA: Diagnosis not present

## 2023-07-30 DIAGNOSIS — Z9189 Other specified personal risk factors, not elsewhere classified: Secondary | ICD-10-CM | POA: Diagnosis not present

## 2023-07-30 DIAGNOSIS — Z86000 Personal history of in-situ neoplasm of breast: Secondary | ICD-10-CM | POA: Diagnosis not present

## 2023-07-30 DIAGNOSIS — Z08 Encounter for follow-up examination after completed treatment for malignant neoplasm: Secondary | ICD-10-CM | POA: Diagnosis not present

## 2023-07-30 DIAGNOSIS — Z1231 Encounter for screening mammogram for malignant neoplasm of breast: Secondary | ICD-10-CM

## 2023-07-30 DIAGNOSIS — R9230 Dense breasts, unspecified: Secondary | ICD-10-CM | POA: Diagnosis not present

## 2023-07-30 DIAGNOSIS — Z9012 Acquired absence of left breast and nipple: Secondary | ICD-10-CM | POA: Insufficient documentation

## 2023-07-30 NOTE — Progress Notes (Signed)
[facility] Cancer Follow up:    Sally Felts, FNP [address] [place] [address]   DIAGNOSIS:  Cancer Staging  Ductal carcinoma in situ (DCIS) of left breast Staging form: Breast, AJCC 8th Edition - Clinical stage from 05/14/2019: Stage Unknown (cTis (DCIS), cNX, cM0, ER-, PR-, HER2: Not Assessed) - Signed by Sally Mood, MD on 05/20/2019 Stage prefix: Initial diagnosis Nuclear grade: G3 - Pathologic stage from 06/09/2019: Stage 0 (pTis (DCIS), pN0, cM0, G3, ER-, PR-, HER2: Not Assessed) - Signed by Sally Samples, NP on 10/15/2019 Histologic grading system: 3 grade system   SUMMARY OF ONCOLOGIC HISTORY: Oncology History  Ductal carcinoma in situ (DCIS) of left breast  05/08/2019 Mammogram   Mammogram 05/08/19  IMPRESSION: Suspicious microcalcifications over the outer midportion of the left breast spanning 2.4 x 3.3 x 3.9 cm.   05/14/2019 Initial Biopsy   Diagnosis 05/14/19  Breast, left, needle core biopsy, outer mid - DUCTAL CARCINOMA IN SITU WITH CALCIFICATIONS. - SEE COMMENT. Estrogen Receptor: 0%, NEGATIVE Progesterone Receptor: 0%, NEGATIVE   05/14/2019 Cancer Staging   Staging form: Breast, AJCC 8th Edition - Clinical stage from 05/14/2019: Stage Unknown (cTis (DCIS), cNX, cM0, ER-, PR-, HER2: Not Assessed) - Signed by Sally Mood, MD on 05/20/2019 Cancer Staging Ductal carcinoma in situ (DCIS) of left breast Staging form: Breast, AJCC 8th Edition - Clinical stage from 05/14/2019: Stage Unknown (cTis (DCIS), cNX, cM0, ER-, PR-, HER2: Not Assessed) - Signed by Sally Mood, MD on 05/20/2019 - Pathologic stage from 06/09/2019: Stage 0 (pTis (DCIS), pN0, cM0, G3, ER-, PR-, HER2: Not Assessed) - Signed by Sally Samples, NP on 10/15/2019    05/15/2019 Initial Diagnosis   Ductal carcinoma in situ (DCIS) of left breast   05/27/2019 Breast MRI   Breast MRI 05/27/19  IMPRESSION: 1. 3.9 centimeter area of non mass enhancement in the LOWER OUTER QUADRANT of the  LEFT breast, correlating well with the extent of calcifications seen mammographically. The clip is centrally located within this group of calcifications/non mass enhancement. 2. Small possible satellite nodule anterior to the non mass enhancement measuring 0.6 centimeters. Consider MR guided core biopsy of this lesion to define the anterior extent. If biopsy of this lesion is benign and concordant, I would recommend localization of the entire area of calcifications, to include the anterior and posterior components.   06/09/2019 Pathology Results   Diagnosis 06/09/19 Breast, left, needle core biopsy, LOQ - DUCTAL CARCINOMA IN SITU WITH CALCIFICATIONS AND NECROSIS, SEE COMMENT. Results: IMMUNOHISTOCHEMICAL AND MORPHOMETRIC ANALYSIS PERFORMED MANUALLY Estrogen Receptor: 0%, NEGATIVE Progesterone Receptor: 0%, NEGATIVE   06/09/2019 Cancer Staging   Staging form: Breast, AJCC 8th Edition - Pathologic stage from 06/09/2019: Stage 0 (pTis (DCIS), pN0, cM0, G3, ER-, PR-, HER2: Not Assessed) - Signed by Sally Samples, NP on 10/15/2019   06/19/2019 Surgery   LEFT MASTECTOMY WITH SENTINEL LYMPH NODE BIOPSY by Dr. Donell Beers  06/19/19    06/19/2019 Pathology Results   Diagnosis 06/19/19 1. Breast, simple mastectomy, Left - DUCTAL CARCINOMA IN SITU, HIGH GRADE, WITH CALCIFICATIONS AND NECROSIS - MARGINS UNINVOLVED BY CARCINOMA (2.5 CM; POSTERIOR) - PREVIOUS BIOPSY SITE CHANGES - SEE ONCOLOGY TABLE AND COMMENT BELOW 2. Lymph node, sentinel, biopsy, Left #1 - NO CARCINOMA IDENTIFIED IN ONE LYMPH NODE (0/1) 3. Lymph node, sentinel, biopsy, Left #2 - NO CARCINOMA IDENTIFIED IN ONE LYMPH NODE (0/1)   10/15/2019 Survivorship   Per Sally Glad, NP      CURRENT THERAPY: Observation  INTERVAL HISTORY: Sally Nguyen 67 y.o. female returns for follow-up of her history of breast cancer.  Her most recent breast MRI occurred December 29, 2022 which was negative for malignancy and showed breast density  category C.  She also undergoes mammogram annually alternating with MRI was recently in June 2023 demonstrating no mammographic evidence of malignancy and breast density category C.  She is doing well today.  She denies any breast concerns.  She is active mainly during the summer going to festivals and concerts.  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  MEDICAL HISTORY: Past Medical History:  Diagnosis Date   Cancer (HCC) 05/2019   left breast DCIS   Seizures (HCC)    Vision abnormalities     SURGICAL HISTORY: Past Surgical History:  Procedure Laterality Date   COLONOSCOPY     MASTECTOMY Left    MASTECTOMY W/ SENTINEL NODE BIOPSY Left 06/19/2019   Procedure: LEFT MASTECTOMY WITH SENTINEL LYMPH NODE BIOPSY;  Surgeon: Almond Lint, MD;  Location: [facility];  Service: General;  Laterality: Left;   WISDOM TOOTH EXTRACTION      SOCIAL HISTORY: Social History   Socioeconomic History   Marital status: Widowed    Spouse name: Not on file   Number of children: 1   Years of education: Not on file   Highest education level: Not on file  Occupational History   Not on file  Tobacco Use   Smoking status: Never   Smokeless tobacco: Never  Vaping Use   Vaping status: Never Used  Substance and Sexual Activity   Alcohol use: No   Drug use: No   Sexual activity: Not Currently    Birth control/protection: Post-menopausal  Other Topics Concern   Not on file  Social History Narrative   Not on file   Social Determinants of Health   Financial Resource Strain: Low Risk   (03/05/2023)   Overall Financial Resource Strain (CARDIA)    Difficulty of Paying Living Expenses: Not hard at all  Food Insecurity: No Food Insecurity (03/05/2023)   Hunger Vital Sign    Worried About Running Out of Food in the Last Year: Never true    Ran Out of Food in the Last Year: Never true  Transportation Needs: No Transportation Needs (03/05/2023)   PRAPARE - Administrator, Civil Service (Medical): No    Lack of Transportation (Non-Medical): No  Physical Activity: Insufficiently Active (03/05/2023)   Exercise Vital Sign    Days of Exercise per Week: 1 day    Minutes of Exercise per Session: 10 min  Stress: No Stress Concern Present (03/05/2023)   [employer] of Occupational Health - Occupational Stress Questionnaire    Feeling of Stress : Not at all  Social Connections: Moderately Isolated (03/05/2023)   Social Connection and Isolation Panel [NHANES]    Frequency of Communication with Friends and Family: Twice a week    Frequency of Social Gatherings with Friends and Family: Twice a week    Attends Religious Services: Never    Database administrator or Organizations: Yes  Attends Banker Meetings: More than 4 times per year    Marital Status: Widowed  Intimate Partner Violence: Unknown (03/05/2023)   Humiliation, Afraid, Rape, and Kick questionnaire    Fear of Current or Ex-Partner: Patient declined    Emotionally Abused: Not on file    Physically Abused: Not on file    Sexually Abused: Not on file    FAMILY HISTORY: Family History  Problem Relation Age of Onset   Liver cancer Mother    Colon cancer Mother        33   Atrial fibrillation Father    Obesity Sister     Review of Systems  Constitutional:  Negative for appetite change, chills, fatigue, fever and unexpected weight change.  HENT:   Negative for hearing loss, lump/mass and trouble swallowing.   Eyes:  Negative for eye problems and icterus.  Respiratory:  Negative for chest  tightness, cough and shortness of breath.   Cardiovascular:  Negative for chest pain, leg swelling and palpitations.  Gastrointestinal:  Negative for abdominal distention, abdominal pain, constipation, diarrhea, nausea and vomiting.  Endocrine: Negative for hot flashes.  Genitourinary:  Negative for difficulty urinating.   Musculoskeletal:  Negative for arthralgias.  Skin:  Negative for itching and rash.  Neurological:  Negative for dizziness, extremity weakness, headaches and numbness.  Hematological:  Negative for adenopathy. Does not bruise/bleed easily.  Psychiatric/Behavioral:  Negative for depression. The patient is not nervous/anxious.       PHYSICAL EXAMINATION    Vitals:   07/30/23 1353  BP: 114/68  Pulse: 92  Resp: 19  Temp: 98.1 F (36.7 C)  SpO2: 97%    Physical Exam Constitutional:      General: She is not in acute distress.    Appearance: Normal appearance. She is not toxic-appearing.  HENT:     Head: Normocephalic and atraumatic.     Mouth/Throat:     Mouth: Mucous membranes are moist.     Pharynx: Oropharynx is clear. No oropharyngeal exudate or posterior oropharyngeal erythema.  Eyes:     General: No scleral icterus. Cardiovascular:     Rate and Rhythm: Normal rate and regular rhythm.     Pulses: Normal pulses.     Heart sounds: Normal heart sounds.  Pulmonary:     Effort: Pulmonary effort is normal.     Breath sounds: Normal breath sounds.  Chest:     Comments: Left breast status postmastectomy no sign of local recurrence right breast is benign. Abdominal:     General: Abdomen is flat. Bowel sounds are normal. There is no distension.     Palpations: Abdomen is soft.     Tenderness: There is no abdominal tenderness.  Musculoskeletal:        General: No swelling.     Cervical back: Neck supple.  Lymphadenopathy:     Cervical: No cervical adenopathy.  Skin:    General: Skin is warm and dry.     Findings: No rash.  Neurological:     General:  No focal deficit present.     Mental Status: She is alert.  Psychiatric:        Nguyen and Affect: Nguyen normal.        Behavior: Behavior normal.     LABORATORY DATA:  CBC    Component Value Date/Time   WBC 5.5 03/05/2023 1122   WBC 4.6 07/14/2021 1322   RBC 4.73 03/05/2023 1122   RBC 4.50 07/14/2021 1322   HGB 13.5 03/05/2023  1122   HCT 42.0 03/05/2023 1122   PLT 267 03/05/2023 1122   MCV 89 03/05/2023 1122   MCH 28.5 03/05/2023 1122   MCH 28.9 07/14/2021 1322   MCHC 32.1 03/05/2023 1122   MCHC 32.7 07/14/2021 1322   RDW 13.0 03/05/2023 1122   LYMPHSABS 1.2 07/14/2021 1322   LYMPHSABS 1.2 04/10/2017 1239   MONOABS 0.6 07/14/2021 1322   EOSABS 0.1 07/14/2021 1322   EOSABS 0.4 04/10/2017 1239   BASOSABS 0.1 07/14/2021 1322   BASOSABS 0.1 04/10/2017 1239    CMP     Component Value Date/Time   NA 140 03/05/2023 1122   K 4.6 03/05/2023 1122   CL 102 03/05/2023 1122   CO2 24 03/05/2023 1122   GLUCOSE 89 03/05/2023 1122   GLUCOSE 68 (L) 07/14/2021 1322   BUN 13 03/05/2023 1122   CREATININE 0.98 03/05/2023 1122   CREATININE 0.83 07/12/2020 1049   CALCIUM 9.8 03/05/2023 1122   PROT 7.1 03/05/2023 1122   ALBUMIN 4.5 03/05/2023 1122   AST 16 03/05/2023 1122   AST 11 (L) 07/12/2020 1049   ALT 14 03/05/2023 1122   ALT 8 07/12/2020 1049   ALKPHOS 75 03/05/2023 1122   BILITOT 0.2 03/05/2023 1122   BILITOT 0.3 07/12/2020 1049   GFRNONAA >60 07/14/2021 1322   GFRNONAA >60 07/12/2020 1049   GFRAA >60 07/12/2020 1049         ASSESSMENT and THERAPY PLAN:   Ductal carcinoma in situ (DCIS) of left breast Sally Nguyen is a 67 year old woman with history of stage 0 left breast DCIS ER/PR negative diagnosed in June 2020 status post left mastectomy who has continued on observation alone with intensified screening.  Stage 0 left-sided breast cancer: She has no clinical or radiographic signs of breast cancer recurrence.  She will continue on observation alone and her mammogram is  overdue therefore ordered today.  This alternates with breast MRI which I ordered for 6 months from now.   Health maintenance: Recommended healthy diet and exercise.  So recommended continued follow-up with primary care to stay up-to-date with her health maintenance. RTC in 1 year for continued f/u.    All questions were answered. The patient knows to call the clinic with any problems, questions or concerns. We can certainly see the patient much sooner if necessary.  Total encounter time:20 minutes*in face-to-face visit time, chart review, lab review, care coordination, order entry, and documentation of the encounter time.  Lillard Anes, NP 07/30/23 2:40 PM Medical Oncology and Hematology [facility] [address] [place], [place] [address] Tel. [phone]    Fax. [phone]  *Total Encounter Time as defined by the Centers for Medicare and Medicaid Services includes, in addition to the face-to-face time of a patient visit (documented in the note above) non-face-to-face time: obtaining and reviewing outside history, ordering and reviewing medications, tests or procedures, care coordination (communications with other health care professionals or caregivers) and documentation in the medical record.

## 2023-07-30 NOTE — Assessment & Plan Note (Addendum)
Sally Nguyen is a 67 year old woman with history of stage 0 left breast DCIS ER/PR negative diagnosed in June 2020 status post left mastectomy who has continued on observation alone with intensified screening.  Stage 0 left-sided breast cancer: She has no clinical or radiographic signs of breast cancer recurrence.  She will continue on observation alone and her mammogram is overdue therefore ordered today.  This alternates with breast MRI which I ordered for 6 months from now.   Health maintenance: Recommended healthy diet and exercise.  So recommended continued follow-up with primary care to stay up-to-date with her health maintenance. RTC in 1 year for continued f/u.

## 2023-08-13 ENCOUNTER — Ambulatory Visit
Admission: RE | Admit: 2023-08-13 | Discharge: 2023-08-13 | Disposition: A | Discharge status: Home / Self Care | Payer: Medicare PPO | Source: Ambulatory Visit | Attending: Adult Health | Admitting: Adult Health

## 2023-08-13 DIAGNOSIS — Z1231 Encounter for screening mammogram for malignant neoplasm of breast: Secondary | ICD-10-CM | POA: Diagnosis not present

## 2023-09-25 DIAGNOSIS — H2513 Age-related nuclear cataract, bilateral: Secondary | ICD-10-CM | POA: Diagnosis not present

## 2023-09-25 DIAGNOSIS — H40013 Open angle with borderline findings, low risk, bilateral: Secondary | ICD-10-CM | POA: Diagnosis not present

## 2023-09-25 DIAGNOSIS — H04123 Dry eye syndrome of bilateral lacrimal glands: Secondary | ICD-10-CM | POA: Diagnosis not present

## 2023-09-25 DIAGNOSIS — H538 Other visual disturbances: Secondary | ICD-10-CM | POA: Diagnosis not present

## 2023-09-25 DIAGNOSIS — H43811 Vitreous degeneration, right eye: Secondary | ICD-10-CM | POA: Diagnosis not present

## 2023-09-25 DIAGNOSIS — D3131 Benign neoplasm of right choroid: Secondary | ICD-10-CM | POA: Diagnosis not present

## 2024-01-30 ENCOUNTER — Ambulatory Visit
Admission: RE | Admit: 2024-01-30 | Discharge: 2024-01-30 | Disposition: A | Discharge status: Home / Self Care | Payer: Medicare PPO | Source: Ambulatory Visit | Attending: Adult Health | Admitting: Adult Health

## 2024-01-30 DIAGNOSIS — D0512 Intraductal carcinoma in situ of left breast: Secondary | ICD-10-CM

## 2024-01-30 DIAGNOSIS — Z9189 Other specified personal risk factors, not elsewhere classified: Secondary | ICD-10-CM

## 2024-01-30 DIAGNOSIS — R9230 Dense breasts, unspecified: Secondary | ICD-10-CM

## 2024-01-30 MED ORDER — GADOPICLENOL 0.5 MMOL/ML IV SOLN
6.0000 mL | Freq: Once | INTRAVENOUS | Status: AC | PRN
  Administered 2024-01-30: 6 mL via INTRAVENOUS

## 2024-03-06 ENCOUNTER — Ambulatory Visit: Payer: Medicare PPO | Admitting: Nurse Practitioner

## 2024-03-06 ENCOUNTER — Encounter: Payer: Self-pay | Admitting: Nurse Practitioner

## 2024-03-06 ENCOUNTER — Other Ambulatory Visit: Payer: Self-pay | Admitting: Nurse Practitioner

## 2024-03-06 VITALS — BP 120/64 | HR 89 | Temp 98.7°F | Ht 66.0 in | Wt 136.2 lb

## 2024-03-06 DIAGNOSIS — D229 Melanocytic nevi, unspecified: Secondary | ICD-10-CM | POA: Diagnosis not present

## 2024-03-06 DIAGNOSIS — R7309 Other abnormal glucose: Secondary | ICD-10-CM | POA: Diagnosis not present

## 2024-03-06 DIAGNOSIS — E2839 Other primary ovarian failure: Secondary | ICD-10-CM

## 2024-03-06 DIAGNOSIS — E7800 Pure hypercholesterolemia, unspecified: Secondary | ICD-10-CM

## 2024-03-06 DIAGNOSIS — Z2821 Immunization not carried out because of patient refusal: Secondary | ICD-10-CM

## 2024-03-06 DIAGNOSIS — Z0000 Encounter for general adult medical examination without abnormal findings: Secondary | ICD-10-CM | POA: Diagnosis not present

## 2024-03-06 DIAGNOSIS — G40209 Localization-related (focal) (partial) symptomatic epilepsy and epileptic syndromes with complex partial seizures, not intractable, without status epilepticus: Secondary | ICD-10-CM

## 2024-03-06 NOTE — Assessment & Plan Note (Signed)
 Pt's annual wellness exam was performed and geriatric assessment reviewed.  Pt has no new identiafble wellness concerns at this time.  WIll obtain routine labs.   Behavior modifications discussed and diet history reviewed. Pt will continue to exercise regularly and modify diet, with low GI, plant based foods and decrease food intake of processed foods.  Recommend intake of daily multivitamin, Vitamin D, and calcium. Recommond mammogram and colonoscopy for preventive screenings, as well as recommend immunizations that include influenza (up to date) and TDAP

## 2024-03-06 NOTE — Assessment & Plan Note (Signed)
 Controlled, continue f/u with Neurology

## 2024-03-06 NOTE — Assessment & Plan Note (Signed)
 Cholesterol levels are stable.  Continue focusing on low-fat diet.

## 2024-03-06 NOTE — Assessment & Plan Note (Signed)
 A1c stable at 5.8. continue focusing on healthy diet.

## 2024-03-06 NOTE — Progress Notes (Signed)
 Subjective:   Sally Nguyen is a 68 y.o. female who presents for Medicare Annual (Subsequent) preventive examination.  Visit Complete: In person  Patient Medicare AWV questionnaire was completed by the patient on 03/06/2024; I have confirmed that all information answered by patient is correct and no changes since this date.        Objective:    Today's Vitals   03/06/24 1011  BP: 120/64  Pulse: 89  Temp: 98.7 F (37.1 C)  TempSrc: Oral  Weight: 136 lb 3.2 oz (61.8 kg)  Height: 5\' 6"  (1.676 m)  PainSc: 0-No pain   Body mass index is 21.98 kg/m.     03/06/2024   10:37 AM 03/05/2023   10:52 AM 07/11/2019   11:56 AM 06/19/2019   10:50 AM 06/13/2019   12:23 PM  Advanced Directives  Does Patient Have a Medical Advance Directive? No No No No No  Would patient like information on creating a medical advance directive? Yes (MAU/Ambulatory/Procedural Areas - Information given) Yes (MAU/Ambulatory/Procedural Areas - Information given) No - Patient declined No - Patient declined No - Patient declined    Current Medications (verified) Outpatient Encounter Medications as of 03/06/2024  Medication Sig   Ascorbic Acid (VITAMIN C) 100 MG tablet Take 100 mg by mouth daily.   Calcium Citrate (CITRACAL PO) Take by mouth. +vit d   cetirizine (ZYRTEC) 10 MG tablet Take 10 mg by mouth daily.   cholecalciferol (VITAMIN D) 1000 units tablet Take 2,000 Units by mouth daily.   gabapentin (NEURONTIN) 100 MG capsule TAKE 1 OR 2 CAPSULES AT BEDTIME   Oxcarbazepine (TRILEPTAL) 300 MG tablet Take 1 and 1/2 tablet in the morning and 2 tablets in the evening.   Turmeric 500 MG CAPS Take 1 tablet by mouth daily.   UNABLE TO FIND 500 mg. Med Name: Tumeric Curcumin   zinc gluconate 50 MG tablet Take 50 mg by mouth daily.   No facility-administered encounter medications on file as of 03/06/2024.    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   Seizures (HCC)    Vision abnormalities    Past Surgical History:  Procedure Laterality Date   COLONOSCOPY     MASTECTOMY Left    MASTECTOMY W/ SENTINEL NODE BIOPSY Left 06/19/2019   Procedure: LEFT MASTECTOMY WITH SENTINEL LYMPH NODE BIOPSY;  Surgeon: Almond Lint, MD;  [facility]  [facility];  Service: General;  Laterality: Left;   WISDOM TOOTH EXTRACTION     Family History  Problem Relation Age of Onset   Liver cancer Mother    Colon cancer Mother        12   Atrial fibrillation Father    Obesity Sister    Social History   Socioeconomic History   Marital status: Widowed    Spouse name: Not on file   Number of children: 1   Years of education: Not on file   Highest education level: Not on file  Occupational History   Not on file  Tobacco Use   Smoking status: Never   Smokeless tobacco: Never  Vaping Use   Vaping status: Never Used  Substance and Sexual Activity   Alcohol use: No   Drug use: No   Sexual activity: Not Currently    Birth control/protection: Post-menopausal  Other Topics Concern   Not on file  Social History Narrative   Not on file  Social Drivers of Corporate investment banker Strain: Low Risk  (03/06/2024)   Overall Financial Resource Strain (CARDIA)    Difficulty of Paying Living Expenses: Not hard at all  Food Insecurity: No Food Insecurity (03/06/2024)   Hunger Vital Sign    Worried About Running Out of Food in the Last Year: Never true    Ran Out of Food in the Last Year: Never true  Transportation Needs: No Transportation Needs (03/06/2024)   PRAPARE - Administrator, Civil Service (Medical): No    Lack of Transportation (Non-Medical): No  Physical Activity: Inactive (03/06/2024)   Exercise Vital Sign    Days of Exercise per Week: 0 days    Minutes of Exercise per Session: 0 min  Stress: No Stress Concern Present (03/06/2024)   [employer] of Occupational Health - Occupational Stress  Questionnaire    Feeling of Stress : Not at all  Social Connections: Moderately Isolated (03/06/2024)   Social Connection and Isolation Panel [NHANES]    Frequency of Communication with Friends and Family: Twice a week    Frequency of Social Gatherings with Friends and Family: Once a week    Attends Religious Services: Never    Database administrator or Organizations: Yes    Attends Banker Meetings: 1 to 4 times per year    Marital Status: Widowed    Tobacco Counseling Counseling given: Not Answered   Clinical Intake:     Pain Score: 0-No pain                  Activities of Daily Living    03/06/2024   10:07 AM  In your present state of health, do you have any difficulty performing the following activities:  Hearing? 1  Vision? 0  Difficulty concentrating or making decisions? 0  Walking or climbing stairs? 0  Dressing or bathing? 0  Doing errands, shopping? 0    Patient Care Team: Arnette Felts, FNP as PCP - General (General Practice) Pershing Proud, RN as Oncology Nurse Navigator Donnelly Angelica, RN as Oncology Nurse Navigator Almond Lint, MD as Consulting Physician (General Surgery) Malachy Mood, MD as Consulting Physician (Hematology) Lonie Peak, MD as Attending Physician (Radiation Oncology) Pollyann Samples, NP as Nurse Practitioner (Nurse Practitioner) [facility] as Consulting Physician (Radiology)  Review of Systems  Constitutional: Negative.   Respiratory: Negative.    Cardiovascular: Negative.   Musculoskeletal: Negative.   Skin:        Right lateral abdomen with an mole present  Neurological: Negative.   Psychiatric/Behavioral: Negative.      Physical Exam Vitals reviewed.  Constitutional:      General: She is not in acute distress.    Appearance: Normal appearance.  Cardiovascular:     Rate and Rhythm: Normal rate and regular rhythm.     Pulses: Normal pulses.     Heart sounds: Normal heart  sounds. No murmur heard. Pulmonary:     Effort: Pulmonary effort is normal. No respiratory distress.     Breath sounds: Normal breath sounds. No wheezing.  Skin:    General: Skin is warm and dry.     Capillary Refill: Capillary refill takes less than 2 seconds.     Findings: Lesion (right lateral abdomen with brown and slightly pink irregular shaped nevi) present.  Neurological:     General: No focal deficit present.     Mental Status: She is alert and oriented to  person, place, and time.     Cranial Nerves: No cranial nerve deficit.     Motor: No weakness.  Psychiatric:        Mood and Affect: Mood normal.        Behavior: Behavior normal.        Thought Content: Thought content normal.        Judgment: Judgment normal.      Indicate any recent Medical Services you may have received from other than [facility] providers in the past year (date may be approximate).    Assessment:   This is a routine wellness examination for [place].  Hearing/Vision screen No results found.   Goals Addressed               This Visit's Progress     Patient Stated (pt-stated)        Planning more trips.       Depression Screen    03/06/2024   10:06 AM 03/05/2023   10:51 AM 03/05/2023   10:10 AM 02/23/2022   10:44 AM 02/22/2021   10:04 AM 02/19/2020   11:05 AM 02/17/2019   11:13 AM  PHQ 2/9 Scores  PHQ - 2 Score 0 0 0 0 0 0 0  PHQ- 9 Score    0   2    Fall Risk    03/05/2023   11:08 AM 03/05/2023   10:10 AM 02/23/2022   10:48 AM 02/19/2020   11:05 AM  Fall Risk   Falls in the past year? 0 0 0 0  Number falls in past yr:   0   Injury with Fall?   0     MEDICARE RISK AT HOME:    TIMED UP AND GO:  Was the test performed?  Yes  Length of time to ambulate 10 feet: 10 sec Gait steady and fast without use of assistive device    Cognitive Function:        03/06/2024   10:08 AM 03/05/2023   11:08 AM  6CIT Screen  What Year? 0 points 0 points  What month? 0 points 0 points  What time? 0  points 0 points  Count back from 20 0 points 0 points  Months in reverse 0 points 0 points  Repeat phrase 0 points 0 points  Total Score 0 points 0 points    Immunizations Immunization History  Administered Date(s) Administered   Influenza,inj,Quad PF,6+ Mos 10/04/2017, 09/20/2018, 09/04/2019   Tdap 12/16/2013    TDAP status: Due, Education has been provided regarding the importance of this vaccine. Advised may receive this vaccine at local pharmacy or Health Dept. Aware to provide a copy of the vaccination record if obtained from local pharmacy or Health Dept. Verbalized acceptance and understanding.  Flu Vaccine status: Up to date  Pneumococcal vaccine status: Declined,  Education has been provided regarding the importance of this vaccine but patient still declined. Advised may receive this vaccine at local pharmacy or Health Dept. Aware to provide a copy of the vaccination record if obtained from local pharmacy or Health Dept. Verbalized acceptance and understanding.   Covid-19 vaccine status: Declined, Education has been provided regarding the importance of this vaccine but patient still declined. Advised may receive this vaccine at local pharmacy or Health Dept.or vaccine clinic. Aware to provide a copy of the vaccination record if obtained from local pharmacy or Health Dept. Verbalized acceptance and understanding.  Qualifies for Shingles Vaccine? Yes   Zostavax completed No   Shingrix  Completed?: No.    Education has been provided regarding the importance of this vaccine. Patient has been advised to call insurance company to determine out of pocket expense if they have not yet received this vaccine. Advised may also receive vaccine at local pharmacy or Health Dept. Verbalized acceptance and understanding.  Screening Tests Health Maintenance  Topic Date Due   Pneumonia Vaccine 44+ Years old (1 of 1 - PCV) 03/04/2025 (Originally 10/25/2022)   DTaP/Tdap/Td (2 - Td or Tdap)  03/06/2025 (Originally 12/17/2023)   Zoster Vaccines- Shingrix (1 of 2) 03/04/2044 (Originally [birth year]/1976)   INFLUENZA VACCINE  07/04/2024   Medicare Annual Wellness (AWV)  03/06/2025   MAMMOGRAM  01/29/2026   Colonoscopy  11/18/2027   DEXA SCAN  Completed   Hepatitis C Screening  Completed   HPV VACCINES  Aged Out   COVID-19 Vaccine  Discontinued    Health Maintenance  There are no preventive care reminders to display for this patient.   Colorectal cancer screening: Type of screening: Colonoscopy. Completed 11/17/2022. Repeat every 5 years  Mammogram status: Completed 01/30/2024. Repeat every year  Bone Density status: Completed 12/01/2019. Results reflect: Bone density results: NORMAL. Repeat every 0 years.  Lung Cancer Screening: (Low Dose CT Chest recommended if Age 84-80 years, 20 pack-year currently smoking OR have quit w/in 15years.) does not qualify.   Lung Cancer Screening Referral: n./a  Additional Screening:  Hepatitis C Screening: does qualify; Completed 02/19/2020  Vision Screening: Recommended annual ophthalmology exams for early detection of glaucoma and other disorders of the eye. Is the patient up to date with their annual eye exam?  Yes  Who is the provider or what is the name of the office in which the patient attends annual eye exams? Dr.Groat If pt is not established with a provider, would they like to be referred to a provider to establish care?  Already established .   Dental Screening: Recommended annual dental exams for proper oral hygiene  Diabetic Foot Exam: not diabetic  Community Resource Referral / Chronic Care Management: CRR required this visit?  No   CCM required this visit?  No     Plan:  Encounter for Medicare annual wellness exam Assessment & Plan: Pt's annual wellness exam was performed and geriatric assessment reviewed.  Pt has no new identiafble wellness concerns at this time.  WIll obtain routine labs.   Behavior modifications  discussed and diet history reviewed. Pt will continue to exercise regularly and modify diet, with low GI, plant based foods and decrease food intake of processed foods.  Recommend intake of daily multivitamin, Vitamin D, and calcium. Recommond mammogram and colonoscopy for preventive screenings, as well as recommend immunizations that include influenza (up to date) and TDAP    Tetanus, diphtheria, and acellular pertussis (Tdap) vaccination declined  Abnormal glucose Assessment & Plan: A1c stable at 5.8. continue focusing on healthy diet.   Orders: -     Hemoglobin A1c  Elevated cholesterol Assessment & Plan: Cholesterol levels are stable. Continue focusing on low fat diet.   Orders: -     Lipid panel  Decreased estrogen level -     DG Bone Density; Future  Atypical nevi Assessment & Plan: Irregular shaped nevi to right lateral abdomen, will refer to Dermatology  Orders: -     Ambulatory referral to Dermatology  Partial symptomatic epilepsy with complex partial seizures, not intractable, without status epilepticus (HCC) Assessment & Plan: Controlled, continue f/u with Neurology  Orders: -  [id number]+EGFR       I have personally reviewed and noted the following in the patient's chart:   Medical and social history Use of alcohol, tobacco or illicit drugs  Current medications and supplements including opioid prescriptions. Patient is not currently taking opioid prescriptions. Functional ability and status Nutritional status Physical activity Advanced directives List of other physicians Hospitalizations, surgeries, and ER visits in previous 12 months Vitals Screenings to include cognitive, depression, and falls Referrals and appointments  In addition, I have reviewed and discussed with patient certain preventive protocols, quality metrics, and best practice recommendations. A written personalized care plan for preventive services as well as general preventive  health recommendations were provided to patient.     Arnette Felts, FNP   03/06/2024   After Visit Summary: (In Person-Printed) AVS printed and given to the patient  Nurse Notes:

## 2024-03-06 NOTE — Assessment & Plan Note (Signed)
 Irregular shaped nevi to right lateral abdomen, will refer to Dermatology

## 2024-03-06 NOTE — Patient Instructions (Signed)
  Sally Nguyen , Thank you for taking time to come for your Medicare Wellness Visit. I appreciate your ongoing commitment to your health goals. Please review the following plan we discussed and let me know if I can assist you in the future.   These are the goals we discussed:  Goals       Patient Stated (pt-stated)      Planning more trips.      Weight (lb) < 130 lb (59 kg)      She would like to lose 5-10 lbs in the next year.         This is a list of the screening recommended for you and due dates:  Health Maintenance  Topic Date Due   Pneumonia Vaccine (1 of 1 - PCV) 03/04/2025*   DTaP/Tdap/Td vaccine (2 - Td or Tdap) 03/06/2025*   Zoster (Shingles) Vaccine (1 of 2) 03/04/2044*   Flu Shot  07/04/2024   Medicare Annual Wellness Visit  03/06/2025   Mammogram  01/29/2026   Colon Cancer Screening  11/18/2027   DEXA scan (bone density measurement)  Completed   Hepatitis C Screening  Completed   HPV Vaccine  Aged Out   COVID-19 Vaccine  Discontinued  *Topic was postponed. The date shown is not the original due date.

## 2024-03-07 LAB — HEMOGLOBIN A1C
Est. average glucose Bld gHb Est-mCnc: 123 mg/dL
Hgb A1c MFr Bld: 5.9 % — ABNORMAL HIGH (ref 4.8–5.6)

## 2024-03-07 LAB — LIPID PANEL
Chol/HDL Ratio: 2.9 ratio (ref 0.0–5.0)
Cholesterol, Total: 192 mg/dL (ref 100–199)
HDL: 66 mg/dL (ref >39)
LDL Chol Calc (NIH): 114 mg/dL — ABNORMAL HIGH (ref 0–99)
Triglycerides: 66 mg/dL (ref 0–149)
VLDL Cholesterol Cal: 12 mg/dL (ref 5–40)

## 2024-03-07 LAB — CMP14+EGFR
ALT: 15 IU/L (ref 0–44)
AST: 17 IU/L (ref 0–40)
Albumin: 4.7 g/dL (ref 3.9–4.9)
Alkaline Phosphatase: 88 IU/L (ref 44–121)
BUN/Creatinine Ratio: 23 (ref 10–24)
BUN: 23 mg/dL (ref 8–27)
Bilirubin Total: 0.3 mg/dL (ref 0.0–1.2)
CO2: 25 mmol/L (ref 20–29)
Calcium: 10.5 mg/dL — ABNORMAL HIGH (ref 8.6–10.2)
Chloride: 100 mmol/L (ref 96–106)
Creatinine, Ser: 1.01 mg/dL (ref 0.76–1.27)
Globulin, Total: 2.7 g/dL (ref 1.5–4.5)
Glucose: 96 mg/dL (ref 70–99)
Potassium: 4.7 mmol/L (ref 3.5–5.2)
Sodium: 140 mmol/L (ref 134–144)
Total Protein: 7.4 g/dL (ref 6.0–8.5)
eGFR: 82 mL/min/{1.73_m2} (ref >59)

## 2024-03-10 ENCOUNTER — Encounter: Payer: Medicare PPO | Admitting: Nurse Practitioner

## 2024-05-14 NOTE — Patient Instructions (Signed)
 Below is our plan:  We will continue oxcarbazepine  450mg  in the morning and 600mg  at bedtime as well as gabapentin  100-200mg  at bedtime.   Please make sure you are consistent with timing of seizure medication. I recommend annual visit with primary care provider (PCP) for complete physical and routine blood work. I recommend daily intake of vitamin D  (400-800iu) and calcium (800-1000mg ) for bone health. Discuss Dexa screening with PCP.   According to [place] law, you can not drive unless you are seizure / syncope free for at least 6 months and under physician's care.  Please maintain precautions. Do not participate in activities where a loss of awareness could harm you or someone else. No swimming alone, no tub bathing, no hot tubs, no driving, no operating motorized vehicles (cars, ATVs, motocycles, etc), lawnmowers, power tools or firearms. No standing at heights, such as rooftops, ladders or stairs. Avoid hot objects such as stoves, heaters, open fires. Wear a helmet when riding a bicycle, scooter, skateboard, etc. and avoid areas of traffic. Set your water heater to 120 degrees or less.  SUDEP is the sudden, unexpected death of someone with epilepsy, who was otherwise healthy. In SUDEP cases, no other cause of death is found when an autopsy is done. Each year, more than 1 in 1,000 people with epilepsy die from SUDEP. This is the leading cause of death in people with uncontrolled seizures. Until further answers are available, the best way to prevent SUDEP is to lower your risk by controlling seizures. Research has found that people with all types of epilepsy that experience convulsive seizures can be at risk.  Please make sure you are staying well hydrated. I recommend 50-60 ounces daily. Well balanced diet and regular exercise encouraged. Consistent sleep schedule with 6-8 hours recommended.   Please continue follow up with care team as directed.   Follow up with me in 1 year   You may receive a  survey regarding today's visit. I encourage you to leave honest feed back as I do use this information to improve patient care. Thank you for seeing me today!

## 2024-05-14 NOTE — Progress Notes (Signed)
 PATIENT: Sally Nguyen DOB: [birth year]  REASON FOR VISIT: follow up HISTORY FROM: patient  Chief Complaint  Patient presents with   RM1/Partial Symptomatic Epilepsy    Pt is here Alone. Pt states that she has been stable.     HISTORY OF PRESENT ILLNESS:  05/15/24 ALL:  Sally Nguyen returns for follow up for seizures and insomnia. She continues oxcarb 450/600 and gabapentin  100-200mg  at bedtime. She is tolerating meds well with no recent seizure activity. She is sleeping well. Usually only takes gabapentin  100mg . Occasionally takes second dose if not able to get to sleep. She drives without difficulty. Occasionally has a headache. Usually managed with Excedrin (once every other month) and regular adjustments with chiropractor. She continues vit D and calcium supplements.   Chem reviewed in Epic form 03/2024.   05/16/2023 ALL:  Sally Nguyen returns for follow up for seizures. She continues oxcarb 450/600 and gabapentin  100-200mg  QHS. She is doing well. No seizure activity. She is tolerating meds well. She continues to drive without difficulty. She is followed annually by PCP. She is taking vitamin D  and calcium supplement.   05/10/2022 ALL: Sally Nguyen returns for follow up for seizures. She continues oxcarbazepine  450/600. Gabapentin  100-200mg  at bedtime helps with sleep. She reports doing really well. She denies seizure activity. No auras. Tolerating meds well. She is sleeping well. She lives with her son. She drives without difficulty. She is taking vitamin D  and calcium supplement. She is followed by PCP regularly. Labs reviewed in Epic.   05/03/2021 ALL: Sally Nguyen returns for follow up for seizures. She continues oxcarbazepine  450mg  in am and 600mg  in pm. She also continues gabapentin  100-200mg  at bedtime for delayed sleep phase. She feels that she is doing well. She lost her husband in April and feels stress levels have been higher but she is doing ok. No seizure activity. She has not slept as well but  feels she is getting back into a normal schedule. She is followed regularly by PCP. She continues vitamin D  and calcium supplements.   04/27/2020 ALL:  Sally Nguyen is a 68 y.o. female here today for follow up for seizures and delayed sleep phase. She continues oxcarbamazepine 450mg  in am and 600mg  in pm. Last seizure in 2004. She continues gabapentin  100-200mg  at bedtime which helps with sleep. She was diagnosed with left sided breast cancer. She had a left mastectomy on 06/19/2019 but did not require any additional treatment. She is followed regularly by oncology. Labs with PCP normal in 02/2020.   HISTORY: (copied from Dr Thom Fleeting note on 05/14/2019)  She denies any more seizures.  Her last seizure was in 2004.  Prior to that, she was having about 1-2 seizures per year.  She tolerates the oxcarbazepine  well. Current dose is 450 mg qAM and 600 mg qHS. In 2018, the blood level was low normal.  Her last EEG was in 2017 and showed hypersynchronous activity in the right temporal region that could be consistent with a seizure disorder.  An EEG in 2016 showed a left temporal focus.  She reports that most EEGs have shown some abnormality.   Her second issue is a mild sleep disturbance.  She has delayed phase sleep syndrome usually going to bed around 2 AM hours of sleep.  When she falls asleep she does well.  Adding the gabapentin  before bedtime has helped her fall asleep and stay asleep better.Aaron Aas   REVIEW OF SYSTEMS: Out of a complete 14 system review of symptoms, the patient complains  only of the following symptoms, insomnia and all other reviewed systems are negative.   ALLERGIES: Allergies  Allergen Reactions   Penicillins Rash    HOME MEDICATIONS: Outpatient Medications Prior to Visit  Medication Sig Dispense Refill   Ascorbic Acid (VITAMIN C) 100 MG tablet Take 100 mg by mouth daily.     Calcium Citrate (CITRACAL PO) Take by mouth. +vit d     cetirizine (ZYRTEC) 10 MG tablet Take 10 mg by  mouth daily.     cholecalciferol (VITAMIN D ) 1000 units tablet Take 2,000 Units by mouth daily.     Turmeric 500 MG CAPS Take 1 tablet by mouth daily.     UNABLE TO FIND 500 mg. Med Name: Tumeric Curcumin     zinc gluconate 50 MG tablet Take 50 mg by mouth daily.     gabapentin  (NEURONTIN ) 100 MG capsule TAKE 1 OR 2 CAPSULES AT BEDTIME 180 capsule 4   Oxcarbazepine  (TRILEPTAL ) 300 MG tablet Take 1 and 1/2 tablet in the morning and 2 tablets in the evening. 360 tablet 4   No facility-administered medications prior to visit.    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   Seizures (HCC)    Vision abnormalities     PAST SURGICAL HISTORY: Past Surgical History:  Procedure Laterality Date   COLONOSCOPY     MASTECTOMY Left    MASTECTOMY W/ SENTINEL NODE BIOPSY Left 06/19/2019   Procedure: LEFT MASTECTOMY WITH SENTINEL LYMPH NODE BIOPSY;  Surgeon: Lockie Rima, MD;  Location: [facility];  Service: General;  Laterality: Left;   WISDOM TOOTH EXTRACTION      FAMILY HISTORY: Family History  Problem Relation Age of Onset   Liver cancer Mother    Colon cancer Mother        54   Atrial fibrillation Father    Obesity Sister     SOCIAL HISTORY: Social History   Socioeconomic History   Marital status: Widowed    Spouse name: Not on file   Number of children: 1   Years of education: Not on file   Highest education level: Not on file  Occupational History   Not on file  Tobacco Use   Smoking status: Never   Smokeless tobacco: Never  Vaping Use   Vaping status: Never Used  Substance and Sexual Activity   Alcohol use: No   Drug use: No   Sexual activity: Not Currently    Birth control/protection: Post-menopausal  Other Topics Concern   Not on file  Social History Narrative   Not on file   Social Drivers of Health   Financial Resource Strain: Low Risk  (03/06/2024)   Overall Financial Resource  Strain (CARDIA)    Difficulty of Paying Living Expenses: Not hard at all  Food Insecurity: No Food Insecurity (03/06/2024)   Hunger Vital Sign    Worried About Running Out of Food in the Last Year: Never true    Ran Out of Food in the Last Year: Never true  Transportation Needs: No Transportation Needs (03/06/2024)   PRAPARE - Administrator, Civil Service (Medical): No    Lack of Transportation (Non-Medical): No  Physical Activity: Inactive (03/06/2024)   Exercise Vital Sign    Days of Exercise per Week: 0 days    Minutes of Exercise per Session: 0 min  Stress: No Stress Concern Present (03/06/2024)   [employer] of Occupational  Health - Occupational Stress Questionnaire    Feeling of Stress : Not at all  Social Connections: Moderately Isolated (03/06/2024)   Social Connection and Isolation Panel    Frequency of Communication with Friends and Family: Twice a week    Frequency of Social Gatherings with Friends and Family: Once a week    Attends Religious Services: Never    Database administrator or Organizations: Yes    Attends Banker Meetings: 1 to 4 times per year    Marital Status: Widowed  Intimate Partner Violence: Not At Risk (03/06/2024)   Humiliation, Afraid, Rape, and Kick questionnaire    Fear of Current or Ex-Partner: No    Emotionally Abused: No    Physically Abused: No    Sexually Abused: No      PHYSICAL EXAM  Vitals:   05/15/24 1259  BP: 107/71  Pulse: 89  SpO2: 98%  Weight: 135 lb 8 oz (61.5 kg)  Height: 5' 6 (1.676 m)      Body mass index is 21.87 kg/m.  Generalized: Well developed, in no acute distress  Cardiology: normal rate and rhythm, no murmur noted Respiratory: clear to auscultation bilaterally  Neurological examination  Mentation: Alert oriented to time, place, history taking. Follows all commands speech and language fluent Cranial nerve II-XII: Pupils were equal round reactive to light. Extraocular movements were  full, visual field were full on confrontational test. Facial sensation and strength were normal. Head turning and shoulder shrug  were normal and symmetric. Motor: The motor testing reveals 5 over 5 strength of all 4 extremities. Good symmetric motor tone is noted throughout.  Sensory: Sensory testing is intact to soft touch on all 4 extremities. No evidence of extinction is noted.  Coordination: Cerebellar testing reveals good finger-nose-finger and heel-to-shin bilaterally.  Gait and station: Gait is normal.    DIAGNOSTIC DATA (LABS, IMAGING, TESTING) - I reviewed patient records, labs, notes, testing and imaging myself where available.      No data to display           Lab Results  Component Value Date   WBC 5.5 03/05/2023   HGB 13.5 03/05/2023   HCT 42.0 03/05/2023   MCV 89 03/05/2023   PLT 267 03/05/2023      Component Value Date/Time   NA 140 03/06/2024 1122   K 4.7 03/06/2024 1122   CL 100 03/06/2024 1122   CO2 25 03/06/2024 1122   GLUCOSE 96 03/06/2024 1122   GLUCOSE 68 (L) 07/14/2021 1322   BUN 23 03/06/2024 1122   CREATININE 1.01 03/06/2024 1122   CREATININE 0.83 07/12/2020 1049   CALCIUM 10.5 (H) 03/06/2024 1122   PROT 7.4 03/06/2024 1122   ALBUMIN 4.7 03/06/2024 1122   AST 17 03/06/2024 1122   AST 11 (L) 07/12/2020 1049   ALT 15 03/06/2024 1122   ALT 8 07/12/2020 1049   ALKPHOS 88 03/06/2024 1122   BILITOT 0.3 03/06/2024 1122   BILITOT 0.3 07/12/2020 1049   GFRNONAA >60 07/14/2021 1322   GFRNONAA >60 07/12/2020 1049   GFRAA >60 07/12/2020 1049   Lab Results  Component Value Date   CHOL 192 03/06/2024   HDL 66 03/06/2024   LDLCALC 114 (H) 03/06/2024   TRIG 66 03/06/2024   CHOLHDL 2.9 03/06/2024   Lab Results  Component Value Date   HGBA1C 5.9 (H) 03/06/2024   Lab Results  Component Value Date   VITAMINB12 477 02/23/2022   Lab Results  Component Value Date  TSH 2.390 02/23/2022       ASSESSMENT AND PLAN 68 y.o. year old female  has  a past medical history of Cancer (HCC) (05/2019), Seizures (HCC), and Vision abnormalities. here with     ICD-10-CM   1. Partial symptomatic epilepsy with complex partial seizures, not intractable, without status epilepticus (HCC)  G40.209 Oxcarbazepine  (Trileptal ), Serum    2. Insomnia, unspecified type  G47.00     3. 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 Episodic tension-type headache, not intractable  G44.219       Apoorva is doing very well today.  She continues oxcarbamazepine 450 mg in the morning and 600 mg in the evenings.  No recent seizure activity.  Gabapentin  100 to 200 mg in the evenings helps with delayed sleep phase.  She is tolerating medications well with no obvious adverse effects.  We will continue current therapy.  Lab work has been reviewed in epic from 03/2024. I will update oxcarbazepine  levels, today. She requests oxcarb level to be completed with annual labs if PCP willing. I am happy to manage results. She was encouraged to stay well-hydrated, focus on a well-balanced diet and regular exercise.  She will continue close follow-up with primary care and oncology. She will follow-up with me in 1 year, sooner if needed.  She verbalizes understanding and agreement with this plan.   Orders Placed This Encounter  Procedures   Oxcarbazepine  (Trileptal ), Serum     Meds ordered this encounter  Medications   gabapentin  (NEURONTIN ) 100 MG capsule    Sig: TAKE 1 OR 2 CAPSULES AT BEDTIME    Dispense:  180 capsule    Refill:  4    Supervising Provider:   AHERN, ANTONIA B [[id number]]   Oxcarbazepine  (TRILEPTAL ) 300 MG tablet    Sig: Take 1 and 1/2 tablet in the morning and 2 tablets in the evening.    Dispense:  360 tablet    Refill:  4    Supervising Provider:   Glory Larsen [[id number]]       GNF AOZHY, FNP-C 05/15/2024, 1:43 PM [facility] [address], [address] [place], [place] [address] [phone]

## 2024-05-15 ENCOUNTER — Encounter: Payer: Self-pay | Admitting: Family Medicine

## 2024-05-15 ENCOUNTER — Ambulatory Visit (INDEPENDENT_AMBULATORY_CARE_PROVIDER_SITE_OTHER): Payer: Medicare PPO | Admitting: Family Medicine

## 2024-05-15 VITALS — BP 107/71 | HR 89 | Ht 66.0 in | Wt 135.5 lb

## 2024-05-15 DIAGNOSIS — G4700 Insomnia, unspecified: Secondary | ICD-10-CM | POA: Diagnosis not present

## 2024-05-15 DIAGNOSIS — G40209 Localization-related (focal) (partial) symptomatic epilepsy and epileptic syndromes with complex partial seizures, not intractable, without status epilepticus: Secondary | ICD-10-CM | POA: Diagnosis not present

## 2024-05-15 DIAGNOSIS — G4721 Circadian rhythm sleep disorder, delayed sleep phase type: Secondary | ICD-10-CM

## 2024-05-15 DIAGNOSIS — G44219 Episodic tension-type headache, not intractable: Secondary | ICD-10-CM | POA: Diagnosis not present

## 2024-05-15 MED ORDER — GABAPENTIN 100 MG PO CAPS: ORAL_CAPSULE | ORAL | 4 refills | Status: AC

## 2024-05-15 MED ORDER — OXCARBAZEPINE 300 MG PO TABS: ORAL_TABLET | ORAL | 4 refills | Status: AC

## 2024-05-19 ENCOUNTER — Ambulatory Visit: Payer: Self-pay | Admitting: Family Medicine

## 2024-05-19 LAB — OXCARBAZEPINE (TRILEPTAL), SERUM: Oxcarbazepine SerPl-Mcnc: 17 ug/mL (ref 10–35)

## 2024-06-26 ENCOUNTER — Ambulatory Visit: Payer: Self-pay

## 2024-06-26 DIAGNOSIS — R300 Dysuria: Secondary | ICD-10-CM | POA: Diagnosis not present

## 2024-06-26 DIAGNOSIS — N3091 Cystitis, unspecified with hematuria: Secondary | ICD-10-CM | POA: Diagnosis not present

## 2024-06-26 DIAGNOSIS — R809 Proteinuria, unspecified: Secondary | ICD-10-CM | POA: Diagnosis not present

## 2024-06-26 DIAGNOSIS — R8281 Pyuria: Secondary | ICD-10-CM | POA: Diagnosis not present

## 2024-06-26 NOTE — Telephone Encounter (Signed)
 FYI Only or Action Required?: FYI only for provider.  Patient was last seen in primary care on 03/06/2024 by Georgina Speaks, FNP.  Called Nurse Triage reporting burning with urination.  Symptoms began a week ago.  Interventions attempted: Nothing.  Symptoms are: gradually worsening.  Triage Disposition: See Physician Within 24 Hours  Patient/caregiver understands and will follow disposition?: Yes   To ER       Copied from CRM [id number]. Topic: Appointments - Appointment Scheduling >> Jun 26, 2024 12:56 PM Selinda RAMAN wrote: The patient called in stating she believes she has a Uti as she has urinary frequency, urgency, pressure and burning. She just got back from a trip and states she needs to get on this with medication. Her provider does not have anything until next week. I will transfer her to E2C2 NT Reason for Disposition  Age > 50 years  Answer Assessment - Initial Assessment Questions 1. SEVERITY: How bad is the pain?  (e.g., Scale 1-10; mild, moderate, or severe)     moderate 2. FREQUENCY: How many times have you had painful urination today?      yes 3. PATTERN: Is pain present every time you urinate or just sometimes?      constant 4. ONSET: When did the painful urination start?      A week ago 5. FEVER: Do you have a fever? If Yes, ask: What is your temperature, how was it measured, and when did it start?     no 6. PAST UTI: Have you had a urine infection before? If Yes, ask: When was the last time? and What happened that time?      yes 7. CAUSE: What do you think is causing the painful urination?  (e.g., UTI, scratch, Herpes sore)     uti 8. OTHER SYMPTOMS: Do you have any other symptoms? (e.g., blood in urine, flank pain, genital sores, urgency, vaginal discharge)     Frequency, urgency,  9. PREGNANCY: Is there any chance you are pregnant? When was your last menstrual period?     na  Protocols used: Urination Pain - Female-A-AH

## 2024-07-01 ENCOUNTER — Other Ambulatory Visit: Payer: Self-pay | Admitting: Adult Health

## 2024-07-01 DIAGNOSIS — Z1231 Encounter for screening mammogram for malignant neoplasm of breast: Secondary | ICD-10-CM

## 2024-07-21 ENCOUNTER — Ambulatory Visit: Payer: Self-pay | Admitting: Nurse Practitioner

## 2024-07-21 ENCOUNTER — Ambulatory Visit (HOSPITAL_BASED_OUTPATIENT_CLINIC_OR_DEPARTMENT_OTHER)
Admission: RE | Admit: 2024-07-21 | Discharge: 2024-07-21 | Disposition: A | Discharge status: Home / Self Care | Source: Ambulatory Visit | Attending: Nurse Practitioner | Admitting: Nurse Practitioner

## 2024-07-21 DIAGNOSIS — E2839 Other primary ovarian failure: Secondary | ICD-10-CM | POA: Diagnosis not present

## 2024-07-21 DIAGNOSIS — M85852 Other specified disorders of bone density and structure, left thigh: Secondary | ICD-10-CM | POA: Diagnosis not present

## 2024-07-21 DIAGNOSIS — Z780 Asymptomatic menopausal state: Secondary | ICD-10-CM | POA: Diagnosis not present

## 2024-07-21 DIAGNOSIS — M85851 Other specified disorders of bone density and structure, right thigh: Secondary | ICD-10-CM | POA: Diagnosis not present

## 2024-07-29 ENCOUNTER — Inpatient Hospital Stay: Payer: Medicare PPO | Attending: Hematology | Admitting: Hematology

## 2024-07-29 VITALS — BP 98/62 | HR 99 | Temp 98.3°F | Resp 17 | Ht 66.0 in | Wt 132.4 lb

## 2024-07-29 DIAGNOSIS — Z86000 Personal history of in-situ neoplasm of breast: Secondary | ICD-10-CM | POA: Diagnosis not present

## 2024-07-29 DIAGNOSIS — Z9012 Acquired absence of left breast and nipple: Secondary | ICD-10-CM | POA: Insufficient documentation

## 2024-07-29 DIAGNOSIS — D0512 Intraductal carcinoma in situ of left breast: Secondary | ICD-10-CM

## 2024-07-29 DIAGNOSIS — Z08 Encounter for follow-up examination after completed treatment for malignant neoplasm: Secondary | ICD-10-CM | POA: Insufficient documentation

## 2024-07-29 NOTE — Progress Notes (Signed)
 [facility]   [phone] [phone]   Clinic Follow up Note   Patient Care Team: Georgina Speaks, FNP as PCP - General (General Practice) Glean Stephane BROCKS, RN (Inactive) as Oncology Nurse Navigator Tyree Nanetta SAILOR, RN as Oncology Nurse Navigator Aron Shoulders, MD as Consulting Physician (General Surgery) Lanny Callander, MD as Consulting Physician (Hematology) Izell Domino, MD as Attending Physician (Radiation Oncology) Burton, Lacie K, NP as Nurse Practitioner (Nurse Practitioner) [facility] as Consulting Physician (Radiology)  Date of Service:  07/29/2024  CHIEF COMPLAINT: f/u of left breast DCIS  CURRENT THERAPY:  Cancer surveillance  Oncology History   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 Her Breast MRI showed another left breast mass anterior to the known mass also DCIS, ER/PR negative.  -Given the extent of the cancer in her left breast she underwent left Mastectomy on 06/19/19 with Dr. Aron.  -post-radiation mastectomy was not recommended  -given ER/PR negative disease, she would not benefit from anti-estrogen therapy  -given her high risk for future breast cancer and dense tissue cat C, she is under close surveillance with right mammogram alternating with screening breast MRI 6 months apart  Assessment & Plan History of left breast cancer, status post mastectomy with residual numbness and altered sensation of left chest wall Status post left mastectomy with residual numbness and altered sensation due to nerve damage from the surgical incision. Some return of sensation on the periphery and tingling in the shoulder area, consistent with nerve pathways from the spine across the chest. She is doing well and follow-up is optional unless issues arise. - Continue annual mammogram screenings. - Consider  alternating mammogram and MRI every six months for additional screening. - Follow up with primary care for routine breast exams and mammogram scheduling. - Return to clinic if any issues arise with mammogram results.  Osteopenia Osteopenia confirmed by recent bone density scan. The condition is not high risk for fracture. She is on an appropriate dose of calcium and vitamin D , likely influenced by her anti-seizure medication. Vitamin D  levels have been normal in the past. Weight-bearing and upper body exercises are recommended to strengthen bones. - Continue current dose of calcium and vitamin D . - Engage in weight-bearing exercises such as walking with a weighted vest and using ankle weights. - Incorporate upper body exercises with dumbbells and stretching at least four to five times a week.  Plan - She is clinically doing well and exam was unremarkable. - Continue annual screening mammogram and breast MRI - I will see her as needed in future.   SUMMARY OF ONCOLOGIC HISTORY: Oncology History  Ductal carcinoma in situ (DCIS) of left breast  05/08/2019 Mammogram   Mammogram 05/08/19  IMPRESSION: Suspicious microcalcifications over the outer midportion of the left breast spanning 2.4 x 3.3 x 3.9 cm.   05/14/2019 Initial Biopsy   Diagnosis 05/14/19  Breast, left, needle core biopsy, outer mid - DUCTAL CARCINOMA IN SITU WITH CALCIFICATIONS. - SEE COMMENT. Estrogen Receptor: 0%, NEGATIVE Progesterone Receptor: 0%, NEGATIVE   05/14/2019 Cancer Staging   Staging form: Breast, AJCC 8th Edition - Clinical stage from 05/14/2019: Stage Unknown (cTis (DCIS), cNX, cM0, ER-, PR-, HER2: Not Assessed) - Signed by Lanny Callander, MD on 05/20/2019 Cancer Staging Ductal carcinoma in situ (DCIS) of left breast Staging form: Breast, AJCC 8th Edition - Clinical stage from 05/14/2019: Stage  Unknown (cTis (DCIS), cNX, cM0, ER-, PR-, HER2: Not Assessed) - Signed by Lanny Callander, MD on 05/20/2019 - Pathologic stage from  06/09/2019: Stage 0 (pTis (DCIS), pN0, cM0, G3, ER-, PR-, HER2: Not Assessed) - Signed by Ann Mayme POUR, NP on 10/15/2019    05/15/2019 Initial Diagnosis   Ductal carcinoma in situ (DCIS) of left breast   05/27/2019 Breast MRI   Breast MRI 05/27/19  IMPRESSION: 1. 3.9 centimeter area of non mass enhancement in the LOWER OUTER QUADRANT of the LEFT breast, correlating well with the extent of calcifications seen mammographically. The clip is centrally located within this group of calcifications/non mass enhancement. 2. Small possible satellite nodule anterior to the non mass enhancement measuring 0.6 centimeters. Consider MR guided core biopsy of this lesion to define the anterior extent. If biopsy of this lesion is benign and concordant, I would recommend localization of the entire area of calcifications, to include the anterior and posterior components.   06/09/2019 Pathology Results   Diagnosis 06/09/19 Breast, left, needle core biopsy, LOQ - DUCTAL CARCINOMA IN SITU WITH CALCIFICATIONS AND NECROSIS, SEE COMMENT. Results: IMMUNOHISTOCHEMICAL AND MORPHOMETRIC ANALYSIS PERFORMED MANUALLY Estrogen Receptor: 0%, NEGATIVE Progesterone Receptor: 0%, NEGATIVE   06/09/2019 Cancer Staging   Staging form: Breast, AJCC 8th Edition - Pathologic stage from 06/09/2019: Stage 0 (pTis (DCIS), pN0, cM0, G3, ER-, PR-, HER2: Not Assessed) - Signed by Burton, Lacie K, NP on 10/15/2019   06/19/2019 Surgery   LEFT MASTECTOMY WITH SENTINEL LYMPH NODE BIOPSY by Dr. Aron  06/19/19    06/19/2019 Pathology Results   Diagnosis 06/19/19 1. Breast, simple mastectomy, Left - DUCTAL CARCINOMA IN SITU, HIGH GRADE, WITH CALCIFICATIONS AND NECROSIS - MARGINS UNINVOLVED BY CARCINOMA (2.5 CM; POSTERIOR) - PREVIOUS BIOPSY SITE CHANGES - SEE ONCOLOGY TABLE AND COMMENT BELOW 2. Lymph node, sentinel, biopsy, Left #1 - NO CARCINOMA IDENTIFIED IN ONE LYMPH NODE (0/1) 3. Lymph node, sentinel, biopsy, Left #2 - NO CARCINOMA  IDENTIFIED IN ONE LYMPH NODE (0/1)   10/15/2019 Survivorship   Per Mayme Ann, NP       Discussed the use of AI scribe software for clinical note transcription with the patient, who gave verbal consent to proceed.  History of Present Illness Sally Nguyen is a 68 year old female who presents for follow-up of breast disease.  She underwent a left-sided mastectomy, resulting in significant numbness due to nerve damage from the incision. Tingling occurs in the shoulder area, and the scar sometimes causes discomfort, particularly in the pool. Some sensation has returned around the scar's periphery, but numbness persists in other areas.  She is scheduled for a mammogram on August 13, 2024. Her previous mammogram was on August 15, 2023, and an MRI was conducted in February 2025. She alternates between mammograms and MRIs every six months, with a delay in scheduling this year.     All other systems were reviewed with the patient and are negative.  MEDICAL HISTORY:  Past Medical History:  Diagnosis Date   Cancer (HCC) 05/2019   left breast DCIS   Seizures (HCC)    Vision abnormalities     SURGICAL HISTORY: Past Surgical History:  Procedure Laterality Date   COLONOSCOPY     MASTECTOMY Left    MASTECTOMY W/ SENTINEL NODE BIOPSY Left 06/19/2019   Procedure: LEFT MASTECTOMY WITH SENTINEL LYMPH NODE BIOPSY;  Surgeon: Aron Shoulders, MD;  Location: [facility];  Service: General;  Laterality: Left;   WISDOM TOOTH EXTRACTION  I have reviewed the social history and family history with the patient and they are unchanged from previous note.  ALLERGIES:  is allergic to penicillins.  MEDICATIONS:  Current Outpatient Medications  Medication Sig Dispense Refill   Ascorbic Acid (VITAMIN C) 100 MG tablet Take 100 mg by mouth daily.     Calcium Citrate (CITRACAL PO) Take by mouth. +vit d     cetirizine (ZYRTEC) 10 MG tablet Take 10 mg by mouth daily.      cholecalciferol (VITAMIN D ) 1000 units tablet Take 2,000 Units by mouth daily.     gabapentin  (NEURONTIN ) 100 MG capsule TAKE 1 OR 2 CAPSULES AT BEDTIME 180 capsule 4   Oxcarbazepine  (TRILEPTAL ) 300 MG tablet Take 1 and 1/2 tablet in the morning and 2 tablets in the evening. 360 tablet 4   Turmeric 500 MG CAPS Take 1 tablet by mouth daily.     UNABLE TO FIND 500 mg. Med Name: Tumeric Curcumin     zinc gluconate 50 MG tablet Take 50 mg by mouth daily.     No current facility-administered medications for this visit.    PHYSICAL EXAMINATION: ECOG PERFORMANCE STATUS: 0 - Asymptomatic  Vitals:   07/29/24 1358  BP: 98/62  Pulse: 99  Resp: 17  Temp: 98.3 F (36.8 C)  SpO2: 96%   Wt Readings from Last 3 Encounters:  07/29/24 132 lb 6.4 oz (60.1 kg)  05/15/24 135 lb 8 oz (61.5 kg)  03/06/24 136 lb 3.2 oz (61.8 kg)     GENERAL:alert, no distress and comfortable SKIN: skin color, texture, turgor are normal, no rashes or significant lesions EYES: normal, Conjunctiva are pink and non-injected, sclera clear NECK: supple, thyroid normal size, non-tender, without nodularity LYMPH:  no palpable lymphadenopathy in the cervical, axillary  LUNGS: clear to auscultation and percussion with normal breathing effort HEART: regular rate & rhythm and no murmurs and no lower extremity edema ABDOMEN:abdomen soft, non-tender and normal bowel sounds Musculoskeletal:no cyanosis of digits and no clubbing  NEURO: alert & oriented x 3 with fluent speech, no focal motor/sensory deficits Breasts: Status post left mastectomy with surgical scars on chest wall, no other palpable nodules.  Palpation of the left breast and axilla revealed no obvious mass that I could appreciate.  Physical Exam    LABORATORY DATA:  I have reviewed the data as listed    Latest Ref Rng & Units 03/05/2023   11:22 AM 02/23/2022   11:48 AM 07/14/2021    1:22 PM  CBC  WBC 3.4 - 10.8 x10E3/uL 5.5  6.1  4.6   Hemoglobin 11.1 - 15.9  g/dL 86.4  85.6  86.9   Hematocrit 34.0 - 46.6 % 42.0  43.8  39.8   Platelets 150 - 450 x10E3/uL 267  303  257         Latest Ref Rng & Units 03/06/2024   11:22 AM 03/05/2023   11:22 AM 02/23/2022   11:48 AM  CMP  Glucose 70 - 99 mg/dL 96  89  91   BUN 8 - 27 mg/dL 23  13  16    Creatinine 0.76 - 1.27 mg/dL 8.98  9.01  9.07   Sodium 134 - 144 mmol/L 140  140  145   Potassium 3.5 - 5.2 mmol/L 4.7  4.6  4.6   Chloride 96 - 106 mmol/L 100  102  101   CO2 20 - 29 mmol/L 25  24  26    Calcium 8.6 - 10.2 mg/dL 10.5  9.8  10.5   Total Protein 6.0 - 8.5 g/dL 7.4  7.1  8.0   Total Bilirubin 0.0 - 1.2 mg/dL 0.3  0.2  0.3   Alkaline Phos 44 - 121 IU/L 88  75  75   AST 0 - 40 IU/L 17  16  15    ALT 0 - 44 IU/L 15  14  14        RADIOGRAPHIC STUDIES: I have personally reviewed the radiological images as listed and agreed with the findings in the report. No results found.    No orders of the defined types were placed in this encounter.  All questions were answered. The patient knows to call the clinic with any problems, questions or concerns. No barriers to learning was detected. The total time spent in the appointment was 20 minutes, including review of chart and various tests results, discussions about plan of care and coordination of care plan     Onita Mattock, MD 07/29/2024

## 2024-07-29 NOTE — Assessment & Plan Note (Signed)
 Ductal carcinoma in situ (DCIS) of left breast, Stage 0, pTisN0M0, ER-/PR-, High Grade -Diagnosed in 05/2019. Her Breast MRI showed another left breast mass anterior to the known mass also DCIS, ER/PR negative.  -Given the extent of the cancer in her left breast she underwent left Mastectomy on 06/19/19 with Dr. Aron.  -post-radiation mastectomy was not recommended  -given ER/PR negative disease, she would not benefit from anti-estrogen therapy  -given her high risk for future breast cancer and dense tissue cat C, she is under close surveillance with right mammogram alternating with screening breast MRI 6 months apart

## 2024-08-13 ENCOUNTER — Ambulatory Visit
Admission: RE | Admit: 2024-08-13 | Discharge: 2024-08-13 | Disposition: A | Discharge status: Home / Self Care | Source: Ambulatory Visit | Attending: Adult Health | Admitting: Adult Health

## 2024-08-13 DIAGNOSIS — Z1231 Encounter for screening mammogram for malignant neoplasm of breast: Secondary | ICD-10-CM

## 2024-09-08 ENCOUNTER — Ambulatory Visit (INDEPENDENT_AMBULATORY_CARE_PROVIDER_SITE_OTHER): Payer: Medicare PPO | Admitting: Nurse Practitioner

## 2024-09-08 ENCOUNTER — Encounter: Payer: Self-pay | Admitting: Nurse Practitioner

## 2024-09-08 VITALS — BP 130/80 | HR 89 | Temp 98.7°F | Ht 66.0 in | Wt 131.6 lb

## 2024-09-08 DIAGNOSIS — Z79899 Other long term (current) drug therapy: Secondary | ICD-10-CM | POA: Diagnosis not present

## 2024-09-08 DIAGNOSIS — Z853 Personal history of malignant neoplasm of breast: Secondary | ICD-10-CM

## 2024-09-08 DIAGNOSIS — E559 Vitamin D deficiency, unspecified: Secondary | ICD-10-CM | POA: Diagnosis not present

## 2024-09-08 DIAGNOSIS — G40209 Localization-related (focal) (partial) symptomatic epilepsy and epileptic syndromes with complex partial seizures, not intractable, without status epilepticus: Secondary | ICD-10-CM

## 2024-09-08 DIAGNOSIS — D229 Melanocytic nevi, unspecified: Secondary | ICD-10-CM

## 2024-09-08 DIAGNOSIS — M8588 Other specified disorders of bone density and structure, other site: Secondary | ICD-10-CM | POA: Diagnosis not present

## 2024-09-08 DIAGNOSIS — Z2821 Immunization not carried out because of patient refusal: Secondary | ICD-10-CM

## 2024-09-08 DIAGNOSIS — Z0000 Encounter for general adult medical examination without abnormal findings: Secondary | ICD-10-CM

## 2024-09-08 DIAGNOSIS — E7800 Pure hypercholesterolemia, unspecified: Secondary | ICD-10-CM | POA: Diagnosis not present

## 2024-09-08 DIAGNOSIS — Z87898 Personal history of other specified conditions: Secondary | ICD-10-CM

## 2024-09-08 DIAGNOSIS — R7309 Other abnormal glucose: Secondary | ICD-10-CM

## 2024-09-08 NOTE — Assessment & Plan Note (Signed)
 Will check vitamin D  level and supplement as needed.    Also encouraged to spend 15 minutes in the sun daily.

## 2024-09-08 NOTE — Progress Notes (Signed)
 Sally Nguyen, CMA,acting as a Neurosurgeon for Sally Ada, FNP.,have documented all relevant documentation on the behalf of Sally Ada, FNP,as directed by  Sally Ada, FNP while in the presence of Sally Ada, FNP.  Subjective:    Patient ID: Sally Nguyen , female    DOB: June 06, 1956 , 68 y.o.   MRN: [id number]  Chief Complaint  Patient presents with   Annual Exam    Patient presents today for HM, Patient reports compliance with medication. Patient denies any chest pain, SOB, or headaches. Patient would like to get some medication for sea sickness patient is going on a cruise in January. She does have a history of motion sickness in the car.      HPI  Discussed the use of AI scribe software for clinical note transcription with the patient, who gave verbal consent to proceed.  History of Present Illness Sally Nguyen is a 68 year old female who presents for an annual physical exam.  She has not seen any other doctors since her last visit except for her neurologist and oncologist. Her oncologist has released her after five years from her breast cancer, and she continues to have either an MRI or a mammogram every six months unless she notices any problems.  She has not seen a dermatologist because the previous referral to a dermatologist no longer accepts her insurance. She has a discolored spot under her knee and a dry patch, which she wants to have evaluated. She experiences dry skin, especially during this time of year.  She experiences hay fever, which is worse in the fall than in the spring, particularly when goldenrods and ragweed are prevalent.  No shortness of breath or chest pain. She experiences discomfort in her feet and ankles only when standing for long periods, such as at music festivals, and uses compression socks to help with this.  She experiences constipation and diarrhea intermittently, often related to her diet and travel habits. Long trips and reduced fluid  intake contribute to these symptoms.  She has osteopenia and takes calcium and vitamin D  supplements but is unsure about the correct dosage.  She is planning to go on a cruise in January and is concerned about motion sickness, as she has a history of nausea when not in control of a vehicle.   Past Medical History:  Diagnosis Date   Allergy Age 35, and 59?   Hay Fever, and later, penicillin   Cancer (HCC) 05/2019   left breast DCIS   Seizures (HCC) 1995   Vision abnormalities      Family History  Problem Relation Age of Onset   Liver cancer Mother    Colon cancer Mother        3   Cancer Mother    Atrial fibrillation Father    Obesity Sister    Diabetes Maternal Grandfather    Diabetes Paternal Grandfather    Breast cancer Neg Hx      Current Outpatient Medications:    Ascorbic Acid (VITAMIN C) 100 MG tablet, Take 100 mg by mouth daily., Disp: , Rfl:    Calcium Citrate (CITRACAL PO), Take by mouth. +vit d, Disp: , Rfl:    cetirizine (ZYRTEC) 10 MG tablet, Take 10 mg by mouth daily., Disp: , Rfl:    cholecalciferol (VITAMIN D ) 1000 units tablet, Take 2,000 Units by mouth daily., Disp: , Rfl:    gabapentin  (NEURONTIN ) 100 MG capsule, TAKE 1 OR 2 CAPSULES AT BEDTIME, Disp: 180 capsule, Rfl:  4   Oxcarbazepine  (TRILEPTAL ) 300 MG tablet, Take 1 and 1/2 tablet in the morning and 2 tablets in the evening., Disp: 360 tablet, Rfl: 4   Turmeric 500 MG CAPS, Take 1 tablet by mouth daily., Disp: , Rfl:    zinc gluconate 50 MG tablet, Take 50 mg by mouth daily., Disp: , Rfl:    Allergies  Allergen Reactions   Penicillins Rash      The patient states she uses post menopausal status for birth control. No LMP recorded. Patient is postmenopausal.  Negative for: breast discharge, breast lump(s), breast pain and breast self exam. Associated symptoms include abnormal vaginal bleeding. Pertinent negatives include abnormal bleeding (hematology), anxiety, decreased libido, depression,  difficulty falling sleep, dyspareunia, history of infertility, nocturia, sexual dysfunction, sleep disturbances, urinary incontinence, urinary urgency, vaginal discharge and vaginal itching. Diet regular.The patient states her exercise level is    . The patient's tobacco use is:  Social History   Tobacco Use  Smoking Status Never  Smokeless Tobacco Never  She has been exposed to passive smoke. The patient's alcohol use is:  Social History   Substance and Sexual Activity  Alcohol Use No    Review of Systems  Constitutional: Negative.   HENT: Negative.    Eyes: Negative.   Respiratory: Negative.    Cardiovascular: Negative.   Gastrointestinal: Negative.   Endocrine: Negative.   Genitourinary: Negative.   Musculoskeletal: Negative.   Skin: Negative.   Allergic/Immunologic: Negative.   Neurological: Negative.   Hematological: Negative.   Psychiatric/Behavioral: Negative.       Today's Vitals   09/08/24 0957 09/08/24 1022  BP: (!) 140/90 130/80  Pulse: 89   Temp: 98.7 F (37.1 C)   TempSrc: Oral   Weight: 131 lb 9.6 oz (59.7 kg)   Height: 5' 6 (1.676 m)   PainSc: 0-No pain    Body mass index is 21.24 kg/m.  Wt Readings from Last 3 Encounters:  09/08/24 131 lb 9.6 oz (59.7 kg)  07/29/24 132 lb 6.4 oz (60.1 kg)  05/15/24 135 lb 8 oz (61.5 kg)     Objective:  Physical Exam Vitals and nursing note reviewed. Exam conducted with a chaperone present.  Constitutional:      General: She is not in acute distress.    Appearance: Normal appearance. She is well-developed and normal weight.  HENT:     Head: Normocephalic and atraumatic.     Right Ear: Hearing, tympanic membrane, ear canal and external ear normal. There is no impacted cerumen.     Left Ear: Hearing, tympanic membrane, ear canal and external ear normal. There is no impacted cerumen.     Nose: Nose normal.     Mouth/Throat:     Mouth: Mucous membranes are moist.  Eyes:     General: Lids are normal.      Extraocular Movements: Extraocular movements intact.     Conjunctiva/sclera: Conjunctivae normal.     Pupils: Pupils are equal, round, and reactive to light.     Funduscopic exam:    Right eye: No papilledema.        Left eye: No papilledema.  Neck:     Thyroid: No thyroid mass.     Vascular: No carotid bruit.  Cardiovascular:     Rate and Rhythm: Normal rate and regular rhythm.     Pulses: Normal pulses.     Heart sounds: Normal heart sounds. No murmur heard. Pulmonary:     Effort: Pulmonary effort is normal. No  respiratory distress.     Breath sounds: Normal breath sounds. No wheezing.  Chest:     Chest wall: No mass.  Breasts:    Tanner Score is 5.     Right: Normal. No mass or tenderness.     Left: Absent. No mass or tenderness.     Comments: Surgical scar to left breast Abdominal:     General: Abdomen is flat. Bowel sounds are normal. There is no distension.     Palpations: Abdomen is soft.     Tenderness: There is no abdominal tenderness.     Hernia: There is no hernia in the left inguinal area or right inguinal area.  Genitourinary:    Exam position: Lithotomy position.     Tanner stage (genital): 5.     Labia:        Right: No rash or tenderness.        Left: No rash or tenderness.      Urethra: No prolapse or urethral swelling.     Vagina: Normal.     Cervix: Normal.     Uterus: Normal.      Adnexa: Right adnexa normal and left adnexa normal.       Right: No mass or tenderness.         Left: No mass or tenderness.    Musculoskeletal:        General: No swelling. Normal range of motion.     Cervical back: Full passive range of motion without pain, normal range of motion and neck supple.     Right lower leg: No edema.     Left lower leg: No edema.  Lymphadenopathy:     Upper Body:     Right upper body: No supraclavicular, axillary or pectoral adenopathy.     Left upper body: No supraclavicular, axillary or pectoral adenopathy.  Skin:    General: Skin is warm  and dry.     Capillary Refill: Capillary refill takes less than 2 seconds.  Neurological:     General: No focal deficit present.     Mental Status: She is alert and oriented to person, place, and time.     Cranial Nerves: No cranial nerve deficit.     Sensory: No sensory deficit.     Motor: No weakness.  Psychiatric:        Mood and Affect: Mood normal.        Behavior: Behavior normal.        Thought Content: Thought content normal.        Judgment: Judgment normal.      Assessment And Plan:     Encounter for annual health examination Assessment & Plan: Five years post-DCIS treatment, released by oncologist. Hesitant about vaccinations. Experiences seasonal allergic rhinitis. Intermittent constipation and diarrhea likely dietary-related. - Order blood work. - Discuss dietary habits and bowel regularity. - Discuss breast health and confirm regular mammograms.   Elevated cholesterol Assessment & Plan: Cholesterol levels are stable. Continue focusing on low fat diet.   Orders: -     CMP14+EGFR -     Lipid panel  Vitamin D  deficiency Assessment & Plan: Will check vitamin D  level and supplement as needed.    Also encouraged to spend 15 minutes in the sun daily.    Orders: -     VITAMIN D  25 Hydroxy (Vit-D Deficiency, Fractures)  Abnormal glucose Assessment & Plan: A1c stable at 5.9. continue focusing on healthy diet.   Orders: -  Hemoglobin A1c  Partial symptomatic epilepsy with complex partial seizures, not intractable, without status epilepticus (HCC) Assessment & Plan: Controlled, continue f/u with Neurology   History of breast cancer  Influenza vaccination declined  Other long term (current) drug therapy -     CBC with Differential/Platelet  H/O motion sickness  Atypical nevi Assessment & Plan: Discoloration under knee and dry skin patch. Dermatology referral delayed due to insurance issues. - Follow up with referral specialist for dermatologist  accepting her insurance.   Osteopenia of lumbar spine Assessment & Plan: Diagnosed with osteopenia, previous bone density normal in 2020. Taking calcium and vitamin D  supplements. [place] - Advise at least 1200 mg calcium daily. - Encourage low-impact walking 20 minutes daily, five days a week. - Discuss dietary calcium sources. - Reinforce vitamin D  supplementation.      Return for 1 year physical, 6 month chol check. Patient was given opportunity to ask questions. Patient verbalized understanding of the plan and was able to repeat key elements of the plan. All questions were answered to their satisfaction.   Sally Ada, FNP  I, Sally Ada, FNP, have reviewed all documentation for this visit. The documentation on 09/08/24 for the exam, diagnosis, procedures, and orders are all accurate and complete.

## 2024-09-08 NOTE — Assessment & Plan Note (Signed)
 Discoloration under knee and dry skin patch. Dermatology referral delayed due to insurance issues. - Follow up with referral specialist for dermatologist accepting her insurance.

## 2024-09-08 NOTE — Assessment & Plan Note (Signed)
 Cholesterol levels are stable.  Continue focusing on low-fat diet.

## 2024-09-08 NOTE — Assessment & Plan Note (Signed)
 Diagnosed with osteopenia, previous bone density normal in 2020. Taking calcium and vitamin D  supplements. [place] - Advise at least 1200 mg calcium daily. - Encourage low-impact walking 20 minutes daily, five days a week. - Discuss dietary calcium sources. - Reinforce vitamin D  supplementation.

## 2024-09-08 NOTE — Assessment & Plan Note (Signed)
 Five years post-DCIS treatment, released by oncologist. Hesitant about vaccinations. Experiences seasonal allergic rhinitis. Intermittent constipation and diarrhea likely dietary-related. - Order blood work. - Discuss dietary habits and bowel regularity. - Discuss breast health and confirm regular mammograms.

## 2024-09-08 NOTE — Assessment & Plan Note (Signed)
 A1c stable at 5.9. continue focusing on healthy diet.

## 2024-09-08 NOTE — Assessment & Plan Note (Signed)
 Controlled, continue f/u with Neurology

## 2024-09-09 ENCOUNTER — Ambulatory Visit: Payer: Self-pay | Admitting: Nurse Practitioner

## 2024-09-09 LAB — LIPID PANEL
Chol/HDL Ratio: 2.9 ratio (ref 0.0–4.4)
Cholesterol, Total: 167 mg/dL (ref 100–199)
HDL: 58 mg/dL (ref >39)
LDL Chol Calc (NIH): 95 mg/dL (ref 0–99)
Triglycerides: 76 mg/dL (ref 0–149)
VLDL Cholesterol Cal: 14 mg/dL (ref 5–40)

## 2024-09-09 LAB — CMP14+EGFR
ALT: 12 IU/L (ref 0–32)
AST: 15 IU/L (ref 0–40)
Albumin: 4.3 g/dL (ref 3.9–4.9)
Alkaline Phosphatase: 70 IU/L (ref 49–135)
BUN/Creatinine Ratio: 17 (ref 12–28)
BUN: 15 mg/dL (ref 8–27)
Bilirubin Total: 0.3 mg/dL (ref 0.0–1.2)
CO2: 23 mmol/L (ref 20–29)
Calcium: 9.6 mg/dL (ref 8.7–10.3)
Chloride: 105 mmol/L (ref 96–106)
Creatinine, Ser: 0.9 mg/dL (ref 0.57–1.00)
Globulin, Total: 2.5 g/dL (ref 1.5–4.5)
Glucose: 84 mg/dL (ref 70–99)
Potassium: 4.2 mmol/L (ref 3.5–5.2)
Sodium: 142 mmol/L (ref 134–144)
Total Protein: 6.8 g/dL (ref 6.0–8.5)
eGFR: 71 mL/min/1.73 (ref >59)

## 2024-09-09 LAB — CBC WITH DIFFERENTIAL/PLATELET
Basophils Absolute: 0.1 x10E3/uL (ref 0.0–0.2)
Basos: 1 %
EOS (ABSOLUTE): 0.1 x10E3/uL (ref 0.0–0.4)
Eos: 2 %
Hematocrit: 40.9 % (ref 34.0–46.6)
Hemoglobin: 13.2 g/dL (ref 11.1–15.9)
Immature Grans (Abs): 0 x10E3/uL (ref 0.0–0.1)
Immature Granulocytes: 0 %
Lymphocytes Absolute: 1.2 x10E3/uL (ref 0.7–3.1)
Lymphs: 21 %
MCH: 29.3 pg (ref 26.6–33.0)
MCHC: 32.3 g/dL (ref 31.5–35.7)
MCV: 91 fL (ref 79–97)
Monocytes Absolute: 0.5 x10E3/uL (ref 0.1–0.9)
Monocytes: 9 %
Neutrophils Absolute: 4.1 x10E3/uL (ref 1.4–7.0)
Neutrophils: 67 %
Platelets: 297 x10E3/uL (ref 150–450)
RBC: 4.51 x10E6/uL (ref 3.77–5.28)
RDW: 13.1 % (ref 11.7–15.4)
WBC: 6 x10E3/uL (ref 3.4–10.8)

## 2024-09-09 LAB — HEMOGLOBIN A1C
Est. average glucose Bld gHb Est-mCnc: 117 mg/dL
Hgb A1c MFr Bld: 5.7 % — ABNORMAL HIGH (ref 4.8–5.6)

## 2024-09-09 LAB — VITAMIN D 25 HYDROXY (VIT D DEFICIENCY, FRACTURES): Vit D, 25-Hydroxy: 62.9 ng/mL (ref 30.0–100.0)

## 2024-09-29 DIAGNOSIS — Z880 Allergy status to penicillin: Secondary | ICD-10-CM | POA: Diagnosis not present

## 2024-09-29 DIAGNOSIS — G40909 Epilepsy, unspecified, not intractable, without status epilepticus: Secondary | ICD-10-CM | POA: Diagnosis not present

## 2024-09-29 DIAGNOSIS — Z9010 Acquired absence of unspecified breast and nipple: Secondary | ICD-10-CM | POA: Diagnosis not present

## 2024-09-29 DIAGNOSIS — Z87440 Personal history of urinary (tract) infections: Secondary | ICD-10-CM | POA: Diagnosis not present

## 2024-09-29 DIAGNOSIS — M8580 Other specified disorders of bone density and structure, unspecified site: Secondary | ICD-10-CM | POA: Diagnosis not present

## 2024-09-29 DIAGNOSIS — Z833 Family history of diabetes mellitus: Secondary | ICD-10-CM | POA: Diagnosis not present

## 2024-09-29 DIAGNOSIS — Z8249 Family history of ischemic heart disease and other diseases of the circulatory system: Secondary | ICD-10-CM | POA: Diagnosis not present

## 2024-09-29 DIAGNOSIS — Z853 Personal history of malignant neoplasm of breast: Secondary | ICD-10-CM | POA: Diagnosis not present

## 2024-09-29 DIAGNOSIS — Z809 Family history of malignant neoplasm, unspecified: Secondary | ICD-10-CM | POA: Diagnosis not present

## 2024-11-10 ENCOUNTER — Other Ambulatory Visit: Payer: Self-pay

## 2024-11-10 MED ORDER — SCOPOLAMINE 1 MG/3DAYS TD PT72: 1.0000 | MEDICATED_PATCH | TRANSDERMAL | 0 refills | Status: AC

## 2024-11-14 ENCOUNTER — Other Ambulatory Visit

## 2024-11-19 ENCOUNTER — Other Ambulatory Visit (HOSPITAL_BASED_OUTPATIENT_CLINIC_OR_DEPARTMENT_OTHER)

## 2025-03-04 ENCOUNTER — Ambulatory Visit: Payer: Self-pay

## 2025-03-10 ENCOUNTER — Ambulatory Visit: Payer: Self-pay | Admitting: Nurse Practitioner

## 2025-05-28 ENCOUNTER — Ambulatory Visit: Admitting: Family Medicine

## 2025-06-01 ENCOUNTER — Ambulatory Visit: Admitting: Family Medicine

## 2025-09-09 ENCOUNTER — Encounter: Payer: Self-pay | Admitting: Nurse Practitioner
